# Patient Record
Sex: Female | Born: 1945
Health system: Southern US, Community
[De-identification: ages and names within clinical notes are randomized; demographics above are authoritative.]

## PROBLEM LIST (undated history)

## (undated) DIAGNOSIS — K224 Dyskinesia of esophagus: Secondary | ICD-10-CM

## (undated) DIAGNOSIS — I519 Heart disease, unspecified: Secondary | ICD-10-CM

## (undated) DIAGNOSIS — E039 Hypothyroidism, unspecified: Secondary | ICD-10-CM

## (undated) DIAGNOSIS — F32A Depression, unspecified: Secondary | ICD-10-CM

## (undated) DIAGNOSIS — K449 Diaphragmatic hernia without obstruction or gangrene: Secondary | ICD-10-CM

## (undated) DIAGNOSIS — E7800 Pure hypercholesterolemia, unspecified: Secondary | ICD-10-CM

## (undated) DIAGNOSIS — T7840XA Allergy, unspecified, initial encounter: Secondary | ICD-10-CM

## (undated) DIAGNOSIS — I10 Essential (primary) hypertension: Secondary | ICD-10-CM

## (undated) DIAGNOSIS — E119 Type 2 diabetes mellitus without complications: Secondary | ICD-10-CM

## (undated) DIAGNOSIS — R7303 Prediabetes: Secondary | ICD-10-CM

## (undated) DIAGNOSIS — R413 Other amnesia: Secondary | ICD-10-CM

## (undated) DIAGNOSIS — F329 Major depressive disorder, single episode, unspecified: Secondary | ICD-10-CM

## (undated) HISTORY — DX: Dyskinesia of esophagus: K22.4

## (undated) HISTORY — DX: Depression, unspecified: F32.A

## (undated) HISTORY — DX: Type 2 diabetes mellitus without complications: E11.9

## (undated) HISTORY — PX: TOTAL ABDOMINAL HYSTERECTOMY W/ BILATERAL SALPINGOOPHORECTOMY: SHX83

## (undated) HISTORY — DX: Diaphragmatic hernia without obstruction or gangrene: K44.9

## (undated) HISTORY — DX: Other amnesia: R41.3

## (undated) HISTORY — DX: Hypothyroidism, unspecified: E03.9

## (undated) HISTORY — PX: BACK SURGERY: SHX140

## (undated) HISTORY — PX: ABDOMINAL HYSTERECTOMY: SHX81

## (undated) HISTORY — DX: Allergy, unspecified, initial encounter: T78.40XA

## (undated) HISTORY — DX: Major depressive disorder, single episode, unspecified: F32.9

## (undated) HISTORY — DX: Heart disease, unspecified: I51.9

## (undated) HISTORY — DX: Pure hypercholesterolemia, unspecified: E78.00

## (undated) HISTORY — DX: Essential (primary) hypertension: I10

---

## 2015-04-08 DIAGNOSIS — R1013 Epigastric pain: Secondary | ICD-10-CM

## 2015-04-08 DIAGNOSIS — R1310 Dysphagia, unspecified: Secondary | ICD-10-CM

## 2015-04-08 HISTORY — DX: Epigastric pain: R10.13

## 2015-04-08 HISTORY — DX: Dysphagia, unspecified: R13.10

## 2015-07-02 DIAGNOSIS — K449 Diaphragmatic hernia without obstruction or gangrene: Secondary | ICD-10-CM | POA: Insufficient documentation

## 2015-07-02 DIAGNOSIS — F331 Major depressive disorder, recurrent, moderate: Secondary | ICD-10-CM | POA: Insufficient documentation

## 2015-07-02 DIAGNOSIS — R5383 Other fatigue: Secondary | ICD-10-CM

## 2015-07-02 DIAGNOSIS — R5381 Other malaise: Secondary | ICD-10-CM

## 2015-07-02 DIAGNOSIS — I5022 Chronic systolic (congestive) heart failure: Secondary | ICD-10-CM

## 2015-07-02 DIAGNOSIS — K5904 Chronic idiopathic constipation: Secondary | ICD-10-CM | POA: Insufficient documentation

## 2015-07-02 DIAGNOSIS — I25110 Atherosclerotic heart disease of native coronary artery with unstable angina pectoris: Secondary | ICD-10-CM | POA: Insufficient documentation

## 2015-07-02 HISTORY — DX: Atherosclerotic heart disease of native coronary artery with unstable angina pectoris: I25.110

## 2015-07-02 HISTORY — DX: Other malaise: R53.81

## 2015-07-02 HISTORY — DX: Other fatigue: R53.83

## 2015-07-02 HISTORY — DX: Diaphragmatic hernia without obstruction or gangrene: K44.9

## 2015-07-02 HISTORY — DX: Major depressive disorder, recurrent, moderate: F33.1

## 2015-07-02 HISTORY — DX: Chronic systolic (congestive) heart failure: I50.22

## 2015-07-02 HISTORY — DX: Chronic idiopathic constipation: K59.04

## 2015-08-11 DIAGNOSIS — I209 Angina pectoris, unspecified: Secondary | ICD-10-CM | POA: Diagnosis present

## 2015-08-11 DIAGNOSIS — E119 Type 2 diabetes mellitus without complications: Secondary | ICD-10-CM | POA: Insufficient documentation

## 2015-08-11 DIAGNOSIS — I200 Unstable angina: Secondary | ICD-10-CM | POA: Diagnosis present

## 2015-08-11 HISTORY — DX: Type 2 diabetes mellitus without complications: E11.9

## 2015-08-11 HISTORY — DX: Unstable angina: I20.0

## 2015-08-11 NOTE — H&P (Signed)
Deborah Farley is an 70 y.o. female.   Chief Complaint: Chest pain HPI: Patient with h/o hypertension, hyperlipidemia with both exertional CP and chest pain at rest ongoing for 5-6 years has noted worseing dyspnea and CP last few months. Ordinary activity brings on her symptoms. CP described as pressure without radiation and present in the precordium. Even taking garbage at home brings on symptoms of dyspnea and CP.  She has had a nuclear stress test about a year ago and this was mildly abnormal.  Hence due to frequent episodes of chest pain she was requested by her cardiologist Dr. Tomie China to proceed with coronary angiography. Patient now presents for the same and evaluation of chest pain.  Past medical history: Hypertension,hyperlipidemia, hyperglycemia.  No past surgical history on file.  Family history: No premature CAD and DM in family.   Social History:  No tobacco, alcohol, and  Illicit drug history.  Allergies: NKDA. Severe contrast allergy. Anaphylaxis.  Scheduled Meds: . famotidine (PEPCID) IV  40 mg Intravenous Once  . sodium chloride flush  3 mL Intravenous Q12H   Continuous Infusions: . heparin     PRN Meds:.sodium chloride, heparin, HYDROmorphone, midazolam, sodium chloride flush  BMP Latest Ref Rng 08/12/2015  Glucose 65 - 99 mg/dL 299(M)  BUN 6 - 20 mg/dL 12  Creatinine 4.26 - 8.34 mg/dL 1.96(Q)  Sodium 229 - 798 mmol/L 141  Potassium 3.5 - 5.1 mmol/L 3.7  Chloride 101 - 111 mmol/L 106  CO2 22 - 32 mmol/L 24  Calcium 8.9 - 10.3 mg/dL 9.2    CBC Latest Ref Rng 08/12/2015  WBC 4.0 - 10.5 K/uL 10.3  Hemoglobin 12.0 - 15.0 g/dL 92.1  Hematocrit 19.4 - 46.0 % 43.7  Platelets 150 - 400 K/uL 261      Review of Systems  Constitutional: Negative for fever and chills.  HENT: Negative for hearing loss.   Eyes: Negative for double vision.  Respiratory: Positive for shortness of breath.   Cardiovascular: Positive for chest pain. Negative for palpitations, orthopnea and  leg swelling.  Gastrointestinal: Negative for heartburn, nausea and abdominal pain.  Genitourinary: Negative for dysuria.  Musculoskeletal: Negative for myalgias.  Neurological: Negative for dizziness and tremors.  Psychiatric/Behavioral: Negative for depression and suicidal ideas.   Blood pressure 113/68, pulse 64, temperature 97.6 F (36.4 C), temperature source Oral, resp. rate 18, height 5\' 3"  (1.6 m), weight 97.523 kg (215 lb), SpO2 96 %.  Physical Exam  Constitutional: No distress.  HENT:  Head: Normocephalic.  Neck: Normal range of motion.  Cardiovascular: Normal rate, regular rhythm, normal heart sounds and intact distal pulses.   Respiratory: Effort normal.  GI: Soft. Bowel sounds are normal.  Musculoskeletal: Normal range of motion.  Neurological: She is alert.  Skin: Skin is warm. She is not diaphoretic.     Assessment/Plan 1. CP 2. HTN 3. Hyperlipidemia 4. Prediabetes  Recommendation: Patient has been recommended coronary angiography. This is due to persistent symptoms of chest discomfort and dyspnea on exertion even with very minimal activities and activities of daily living.  Discussed risks, benefits and alternatives of angiogram including but not limited to <1% risk of death, stroke, MI, need for urgent surgical revascularization, renal failure, but not limited to thest. patient is willing to proceed.   , MD 08/11/2015, 9:33 PM

## 2015-08-12 ENCOUNTER — Ambulatory Visit (HOSPITAL_COMMUNITY)
Admission: RE | Admit: 2015-08-12 | Discharge: 2015-08-12 | Disposition: A | Discharge status: Home / Self Care | Payer: Medicare Other | Source: Ambulatory Visit | Attending: Cardiology | Admitting: Cardiology

## 2015-08-12 ENCOUNTER — Encounter (HOSPITAL_COMMUNITY)
Admission: RE | Disposition: A | Discharge status: Home / Self Care | Payer: Self-pay | Source: Ambulatory Visit | Attending: Cardiology

## 2015-08-12 DIAGNOSIS — R0600 Dyspnea, unspecified: Secondary | ICD-10-CM | POA: Insufficient documentation

## 2015-08-12 DIAGNOSIS — I200 Unstable angina: Secondary | ICD-10-CM | POA: Diagnosis present

## 2015-08-12 DIAGNOSIS — Q245 Malformation of coronary vessels: Secondary | ICD-10-CM | POA: Diagnosis not present

## 2015-08-12 DIAGNOSIS — R0789 Other chest pain: Secondary | ICD-10-CM | POA: Insufficient documentation

## 2015-08-12 DIAGNOSIS — E785 Hyperlipidemia, unspecified: Secondary | ICD-10-CM | POA: Diagnosis not present

## 2015-08-12 DIAGNOSIS — I209 Angina pectoris, unspecified: Secondary | ICD-10-CM | POA: Diagnosis present

## 2015-08-12 DIAGNOSIS — M069 Rheumatoid arthritis, unspecified: Secondary | ICD-10-CM | POA: Insufficient documentation

## 2015-08-12 DIAGNOSIS — R7303 Prediabetes: Secondary | ICD-10-CM | POA: Diagnosis not present

## 2015-08-12 DIAGNOSIS — I10 Essential (primary) hypertension: Secondary | ICD-10-CM | POA: Diagnosis not present

## 2015-08-12 HISTORY — PX: CARDIAC CATHETERIZATION: SHX172

## 2015-08-12 LAB — BASIC METABOLIC PANEL
Anion gap: 11 (ref 5–15)
BUN: 12 mg/dL (ref 6–20)
CALCIUM: 9.2 mg/dL (ref 8.9–10.3)
CO2: 24 mmol/L (ref 22–32)
CREATININE: 1.07 mg/dL — AB (ref 0.44–1.00)
Chloride: 106 mmol/L (ref 101–111)
GFR calc Af Amer: 60 mL/min — ABNORMAL LOW (ref >60)
GFR, EST NON AFRICAN AMERICAN: 51 mL/min — AB (ref >60)
Glucose, Bld: 110 mg/dL — ABNORMAL HIGH (ref 65–99)
POTASSIUM: 3.7 mmol/L (ref 3.5–5.1)
SODIUM: 141 mmol/L (ref 135–145)

## 2015-08-12 LAB — CBC
HCT: 43.7 % (ref 36.0–46.0)
Hemoglobin: 14.9 g/dL (ref 12.0–15.0)
MCH: 32.9 pg (ref 26.0–34.0)
MCHC: 34.1 g/dL (ref 30.0–36.0)
MCV: 96.5 fL (ref 78.0–100.0)
PLATELETS: 261 10*3/uL (ref 150–400)
RBC: 4.53 MIL/uL (ref 3.87–5.11)
RDW: 13.9 % (ref 11.5–15.5)
WBC: 10.3 10*3/uL (ref 4.0–10.5)

## 2015-08-12 LAB — PROTIME-INR
INR: 1.07 (ref 0.00–1.49)
PROTHROMBIN TIME: 14.1 s (ref 11.6–15.2)

## 2015-08-12 SURGERY — LEFT HEART CATH AND CORONARY ANGIOGRAPHY

## 2015-08-12 MED ORDER — ASPIRIN 81 MG PO CHEW
81.0000 mg | CHEWABLE_TABLET | ORAL | Status: AC
  Administered 2015-08-12: 81 mg via ORAL

## 2015-08-12 MED ORDER — NITROGLYCERIN 1 MG/10 ML FOR IR/CATH LAB
INTRA_ARTERIAL | Status: DC | PRN
  Administered 2015-08-12: 200 ug via INTRACORONARY

## 2015-08-12 MED ORDER — HEPARIN SODIUM (PORCINE) 1000 UNIT/ML IJ SOLN
INTRAMUSCULAR | Status: AC
  Filled 2015-08-12: qty 1

## 2015-08-12 MED ORDER — MIDAZOLAM HCL 2 MG/2ML IJ SOLN
INTRAMUSCULAR | Status: AC
  Filled 2015-08-12: qty 2

## 2015-08-12 MED ORDER — METHYLPREDNISOLONE SODIUM SUCC 125 MG IJ SOLR
125.0000 mg | Freq: Once | INTRAMUSCULAR | Status: AC
  Administered 2015-08-12: 125 mg via INTRAVENOUS

## 2015-08-12 MED ORDER — SODIUM CHLORIDE 0.9% FLUSH
3.0000 mL | INTRAVENOUS | Status: DC | PRN
Start: 2015-08-12 — End: 2015-08-12

## 2015-08-12 MED ORDER — SODIUM CHLORIDE 0.9 % IV SOLN: 40.0000 mg | Freq: Once | INTRAVENOUS | Status: DC

## 2015-08-12 MED ORDER — HEPARIN (PORCINE) IN NACL 2-0.9 UNIT/ML-% IJ SOLN
INTRAMUSCULAR | Status: AC
  Filled 2015-08-12: qty 1000

## 2015-08-12 MED ORDER — HEPARIN SODIUM (PORCINE) 1000 UNIT/ML IJ SOLN
INTRAMUSCULAR | Status: DC | PRN
  Administered 2015-08-12: 4000 [IU] via INTRAVENOUS

## 2015-08-12 MED ORDER — IOPAMIDOL (ISOVUE-370) INJECTION 76%
INTRAVENOUS | Status: AC
  Filled 2015-08-12: qty 100

## 2015-08-12 MED ORDER — IOPAMIDOL (ISOVUE-370) INJECTION 76%
INTRAVENOUS | Status: DC | PRN
  Administered 2015-08-12: 50 mL via INTRAVENOUS

## 2015-08-12 MED ORDER — SODIUM CHLORIDE 0.9 % WEIGHT BASED INFUSION: 3.0000 mL/kg/h | INTRAVENOUS | Status: DC

## 2015-08-12 MED ORDER — LIDOCAINE HCL (PF) 1 % IJ SOLN
INTRAMUSCULAR | Status: AC
  Filled 2015-08-12: qty 30

## 2015-08-12 MED ORDER — METHYLPREDNISOLONE SODIUM SUCC 125 MG IJ SOLR
INTRAMUSCULAR | Status: AC
  Administered 2015-08-12: 125 mg via INTRAVENOUS
  Filled 2015-08-12: qty 2

## 2015-08-12 MED ORDER — SODIUM CHLORIDE 0.9 % WEIGHT BASED INFUSION
3.0000 mL/kg/h | INTRAVENOUS | Status: DC
  Administered 2015-08-12: 3 mL/kg/h via INTRAVENOUS

## 2015-08-12 MED ORDER — FAMOTIDINE IN NACL 20-0.9 MG/50ML-% IV SOLN
INTRAVENOUS | Status: AC
  Administered 2015-08-12: 20 mg
  Filled 2015-08-12: qty 50

## 2015-08-12 MED ORDER — VERAPAMIL HCL 2.5 MG/ML IV SOLN
INTRAVENOUS | Status: AC
  Filled 2015-08-12: qty 2

## 2015-08-12 MED ORDER — SODIUM CHLORIDE 0.9 % IV SOLN
250.0000 mL | INTRAVENOUS | Status: DC | PRN
Start: 2015-08-12 — End: 2015-08-12

## 2015-08-12 MED ORDER — DIPHENHYDRAMINE HCL 50 MG/ML IJ SOLN
INTRAMUSCULAR | Status: AC
  Administered 2015-08-12: 25 mg via INTRAVENOUS
  Filled 2015-08-12: qty 1

## 2015-08-12 MED ORDER — HYDROMORPHONE HCL 1 MG/ML IJ SOLN
INTRAMUSCULAR | Status: DC | PRN
  Administered 2015-08-12: 0.5 mg via INTRAVENOUS

## 2015-08-12 MED ORDER — SODIUM CHLORIDE 0.9% FLUSH: 3.0000 mL | Freq: Two times a day (BID) | INTRAVENOUS | Status: DC

## 2015-08-12 MED ORDER — HYDROMORPHONE HCL 1 MG/ML IJ SOLN
INTRAMUSCULAR | Status: AC
  Filled 2015-08-12: qty 1

## 2015-08-12 MED ORDER — MIDAZOLAM HCL 2 MG/2ML IJ SOLN
INTRAMUSCULAR | Status: DC | PRN
  Administered 2015-08-12: 2 mg via INTRAVENOUS

## 2015-08-12 MED ORDER — DIPHENHYDRAMINE HCL 50 MG/ML IJ SOLN
25.0000 mg | Freq: Once | INTRAMUSCULAR | Status: AC
  Administered 2015-08-12: 25 mg via INTRAVENOUS

## 2015-08-12 MED ORDER — LIDOCAINE HCL (PF) 1 % IJ SOLN
INTRAMUSCULAR | Status: DC | PRN
  Administered 2015-08-12: 3 mL

## 2015-08-12 MED ORDER — DILTIAZEM HCL ER COATED BEADS 180 MG PO CP24: 180.0000 mg | ORAL_CAPSULE | Freq: Every day | ORAL | Status: DC

## 2015-08-12 MED ORDER — HEPARIN (PORCINE) IN NACL 2-0.9 UNIT/ML-% IJ SOLN
INTRAMUSCULAR | Status: DC | PRN
  Administered 2015-08-12: 1000 mL

## 2015-08-12 MED ORDER — SODIUM CHLORIDE 0.9 % WEIGHT BASED INFUSION: 1.0000 mL/kg/h | INTRAVENOUS | Status: DC

## 2015-08-12 MED ORDER — SODIUM CHLORIDE 0.9% FLUSH: 3.0000 mL | INTRAVENOUS | Status: DC | PRN

## 2015-08-12 MED ORDER — LIDOCAINE HCL (PF) 1 % IJ SOLN
INTRAMUSCULAR | Status: DC | PRN
  Administered 2015-08-12: 16:00:00 via INTRA_ARTERIAL

## 2015-08-12 MED ORDER — NITROGLYCERIN 1 MG/10 ML FOR IR/CATH LAB
INTRA_ARTERIAL | Status: AC
  Filled 2015-08-12: qty 10

## 2015-08-12 MED ORDER — ASPIRIN 81 MG PO CHEW
CHEWABLE_TABLET | ORAL | Status: AC
  Administered 2015-08-12: 81 mg via ORAL
  Filled 2015-08-12: qty 1

## 2015-08-12 SURGICAL SUPPLY — 8 items
CATH OPTITORQUE TIG 4.0 5F (CATHETERS) ×2 IMPLANT
DEVICE RAD COMP TR BAND LRG (VASCULAR PRODUCTS) ×2 IMPLANT
GLIDESHEATH SLEND A-KIT 6F 20G (SHEATH) ×2 IMPLANT
KIT HEART LEFT (KITS) ×2 IMPLANT
PACK CARDIAC CATHETERIZATION (CUSTOM PROCEDURE TRAY) ×2 IMPLANT
TRANSDUCER W/STOPCOCK (MISCELLANEOUS) ×2 IMPLANT
TUBING CIL FLEX 10 FLL-RA (TUBING) ×2 IMPLANT
WIRE SAFE-T 1.5MM-J .035X260CM (WIRE) ×2 IMPLANT

## 2015-08-12 NOTE — Discharge Instructions (Signed)
Radial Site Care °Refer to this sheet in the next few weeks. These instructions provide you with information about caring for yourself after your procedure. Your health care provider may also give you more specific instructions. Your treatment has been planned according to current medical practices, but problems sometimes occur. Call your health care provider if you have any problems or questions after your procedure. °WHAT TO EXPECT AFTER THE PROCEDURE °After your procedure, it is typical to have the following: °· Bruising at the radial site that usually fades within 1-2 weeks. °· Blood collecting in the tissue (hematoma) that may be painful to the touch. It should usually decrease in size and tenderness within 1-2 weeks. °HOME CARE INSTRUCTIONS °· Take medicines only as directed by your health care provider. °· You may shower 24-48 hours after the procedure or as directed by your health care provider. Remove the bandage (dressing) and gently wash the site with plain soap and water. Pat the area dry with a clean towel. Do not rub the site, because this may cause bleeding. °· Do not take baths, swim, or use a hot tub until your health care provider approves. °· Check your insertion site every day for redness, swelling, or drainage. °· Do not apply powder or lotion to the site. °· Do not flex or bend the affected arm for 24 hours or as directed by your health care provider. °· Do not push or pull heavy objects with the affected arm for 24 hours or as directed by your health care provider. °· Do not lift over 10 lb (4.5 kg) for 5 days after your procedure or as directed by your health care provider. °· Ask your health care provider when it is okay to: °¨ Return to work or school. °¨ Resume usual physical activities or sports. °¨ Resume sexual activity. °· Do not drive home if you are discharged the same day as the procedure. Have someone else drive you. °· You may drive 24 hours after the procedure unless otherwise  instructed by your health care provider. °· Do not operate machinery or power tools for 24 hours after the procedure. °· If your procedure was done as an outpatient procedure, which means that you went home the same day as your procedure, a responsible adult should be with you for the first 24 hours after you arrive home. °· Keep all follow-up visits as directed by your health care provider. This is important. °SEEK MEDICAL CARE IF: °· You have a fever. °· You have chills. °· You have increased bleeding from the radial site. Hold pressure on the site. CALL 911 °SEEK IMMEDIATE MEDICAL CARE IF: °· You have unusual pain at the radial site. °· You have redness, warmth, or swelling at the radial site. °· You have drainage (other than a small amount of blood on the dressing) from the radial site. °· The radial site is bleeding, and the bleeding does not stop after 30 minutes of holding steady pressure on the site. °· Your arm or hand becomes pale, cool, tingly, or numb. °  °This information is not intended to replace advice given to you by your health care provider. Make sure you discuss any questions you have with your health care provider. °  °Document Released: 05/27/2010 Document Revised: 05/15/2014 Document Reviewed: 11/10/2013 °Elsevier Interactive Patient Education ©2016 [employer] ° °

## 2015-08-12 NOTE — Interval H&P Note (Signed)
History and Physical Interval Note:  08/12/2015 4:13 PM  Deborah Farley  has presented today for surgery, with the diagnosis of cp  The various methods of treatment have been discussed with the patient and family. After consideration of risks, benefits and other options for treatment, the patient has consented to  Procedure(s): Left Heart Cath and Coronary Angiography (N/A) and possible PCI as a surgical intervention .  The patient's history has been reviewed, patient examined, no change in status, stable for surgery.  I have reviewed the patient's chart and labs.  Questions were answered to the patient's satisfaction.   Ischemic Symptoms? CCS III (Marked limitation of ordinary activity) Anti-ischemic Medical Therapy? Maximal Medical Therapy (2 or more classes of medications) Non-invasive Test Results? No non-invasive testing performed Prior CABG? No Previous CABG   Patient Information:   1-2V CAD, no prox LAD  A (7)  Indication: 20; Score: 7   Patient Information:   1-2V-CAD with DS 50-60% With No FFR, No IVUS  I (3)  Indication: 21; Score: 3   Patient Information:   1-2V-CAD with DS 50-60% With FFR  A (7)  Indication: 22; Score: 7   Patient Information:   1-2V-CAD with DS 50-60% With FFR>0.8, IVUS not significant  I (2)  Indication: 23; Score: 2   Patient Information:   3V-CAD without LMCA With Abnormal LV systolic function  A (9)  Indication: 48; Score: 9   Patient Information:   LMCA-CAD  A (9)  Indication: 49; Score: 9   Patient Information:   2V-CAD with prox LAD PCI  A (7)  Indication: 62; Score: 7   Patient Information:   2V-CAD with prox LAD CABG  A (8)  Indication: 62; Score: 8   Patient Information:   3V-CAD without LMCA With Low CAD burden(i.e., 3 focal stenoses, low SYNTAX score) PCI  A (7)  Indication: 63; Score: 7   Patient Information:   3V-CAD without LMCA With Low CAD burden(i.e., 3 focal stenoses, low SYNTAX  score) CABG  A (9)  Indication: 63; Score: 9   Patient Information:   3V-CAD without LMCA E06c - Intermediate-high CAD burden (i.e., multiple diffuse lesions, presence of CTO, or high SYNTAX score) PCI  U (4)  Indication: 64; Score: 4   Patient Information:   3V-CAD without LMCA E06c - Intermediate-high CAD burden (i.e., multiple diffuse lesions, presence of CTO, or high SYNTAX score) CABG  A (9)  Indication: 64; Score: 9   Patient Information:   LMCA-CAD With Isolated LMCA stenosis  PCI  U (6)  Indication: 65; Score: 6   Patient Information:   LMCA-CAD With Isolated LMCA stenosis  CABG  A (9)  Indication: 65; Score: 9   Patient Information:   LMCA-CAD Additional CAD, low CAD burden (i.e., 1- to 2-vessel additional involvement, low SYNTAX score) PCI  U (5)  Indication: 66; Score: 5   Patient Information:   LMCA-CAD Additional CAD, low CAD burden (i.e., 1- to 2-vessel additional involvement, low SYNTAX score) CABG  A (9)  Indication: 66; Score: 9   Patient Information:   LMCA-CAD Additional CAD, intermediate-high CAD burden (i.e., 3-vessel involvement, presence of CTO, or high SYNTAX score) PCI  I (3)  Indication: 67; Score: 3   Patient Information:   LMCA-CAD Additional CAD, intermediate-high CAD burden (i.e., 3-vessel involvement, presence of CTO, or high SYNTAX score) CABG  A (9)  Indication: 67; Score: 9   Deborah Farley

## 2015-08-13 ENCOUNTER — Encounter (HOSPITAL_COMMUNITY): Payer: Self-pay | Admitting: Cardiology

## 2015-09-08 DIAGNOSIS — Q245 Malformation of coronary vessels: Secondary | ICD-10-CM | POA: Diagnosis not present

## 2015-09-08 DIAGNOSIS — E119 Type 2 diabetes mellitus without complications: Secondary | ICD-10-CM | POA: Diagnosis not present

## 2015-09-08 DIAGNOSIS — E785 Hyperlipidemia, unspecified: Secondary | ICD-10-CM | POA: Diagnosis not present

## 2015-09-08 DIAGNOSIS — I10 Essential (primary) hypertension: Secondary | ICD-10-CM | POA: Diagnosis not present

## 2015-09-10 DIAGNOSIS — Q245 Malformation of coronary vessels: Secondary | ICD-10-CM | POA: Diagnosis not present

## 2015-09-10 DIAGNOSIS — E785 Hyperlipidemia, unspecified: Secondary | ICD-10-CM | POA: Diagnosis not present

## 2015-09-10 DIAGNOSIS — Z7901 Long term (current) use of anticoagulants: Secondary | ICD-10-CM | POA: Diagnosis not present

## 2015-09-10 DIAGNOSIS — I10 Essential (primary) hypertension: Secondary | ICD-10-CM | POA: Diagnosis not present

## 2015-09-10 DIAGNOSIS — E119 Type 2 diabetes mellitus without complications: Secondary | ICD-10-CM | POA: Diagnosis not present

## 2015-09-10 DIAGNOSIS — I4892 Unspecified atrial flutter: Secondary | ICD-10-CM | POA: Diagnosis not present

## 2015-10-05 DIAGNOSIS — I10 Essential (primary) hypertension: Secondary | ICD-10-CM | POA: Diagnosis not present

## 2015-10-05 DIAGNOSIS — R7303 Prediabetes: Secondary | ICD-10-CM | POA: Diagnosis not present

## 2015-10-05 DIAGNOSIS — E559 Vitamin D deficiency, unspecified: Secondary | ICD-10-CM | POA: Diagnosis not present

## 2015-10-05 DIAGNOSIS — E039 Hypothyroidism, unspecified: Secondary | ICD-10-CM | POA: Diagnosis not present

## 2015-10-05 DIAGNOSIS — Z79899 Other long term (current) drug therapy: Secondary | ICD-10-CM | POA: Diagnosis not present

## 2015-10-05 DIAGNOSIS — E785 Hyperlipidemia, unspecified: Secondary | ICD-10-CM | POA: Diagnosis not present

## 2016-01-31 DIAGNOSIS — E039 Hypothyroidism, unspecified: Secondary | ICD-10-CM | POA: Diagnosis not present

## 2016-01-31 DIAGNOSIS — I10 Essential (primary) hypertension: Secondary | ICD-10-CM | POA: Diagnosis not present

## 2016-01-31 DIAGNOSIS — Z0000 Encounter for general adult medical examination without abnormal findings: Secondary | ICD-10-CM | POA: Diagnosis not present

## 2016-01-31 DIAGNOSIS — E785 Hyperlipidemia, unspecified: Secondary | ICD-10-CM | POA: Diagnosis not present

## 2016-01-31 DIAGNOSIS — Z9181 History of falling: Secondary | ICD-10-CM | POA: Diagnosis not present

## 2016-01-31 DIAGNOSIS — Z23 Encounter for immunization: Secondary | ICD-10-CM | POA: Diagnosis not present

## 2016-02-25 DIAGNOSIS — M8588 Other specified disorders of bone density and structure, other site: Secondary | ICD-10-CM | POA: Diagnosis not present

## 2016-02-25 DIAGNOSIS — Z1231 Encounter for screening mammogram for malignant neoplasm of breast: Secondary | ICD-10-CM | POA: Diagnosis not present

## 2016-02-25 DIAGNOSIS — M8589 Other specified disorders of bone density and structure, multiple sites: Secondary | ICD-10-CM | POA: Diagnosis not present

## 2016-02-28 DIAGNOSIS — I10 Essential (primary) hypertension: Secondary | ICD-10-CM | POA: Diagnosis not present

## 2016-02-28 DIAGNOSIS — E785 Hyperlipidemia, unspecified: Secondary | ICD-10-CM | POA: Diagnosis not present

## 2016-02-28 DIAGNOSIS — Q245 Malformation of coronary vessels: Secondary | ICD-10-CM | POA: Diagnosis not present

## 2016-02-28 DIAGNOSIS — E119 Type 2 diabetes mellitus without complications: Secondary | ICD-10-CM | POA: Diagnosis not present

## 2016-02-29 DIAGNOSIS — I10 Essential (primary) hypertension: Secondary | ICD-10-CM | POA: Insufficient documentation

## 2016-02-29 DIAGNOSIS — E785 Hyperlipidemia, unspecified: Secondary | ICD-10-CM | POA: Insufficient documentation

## 2016-02-29 DIAGNOSIS — E039 Hypothyroidism, unspecified: Secondary | ICD-10-CM | POA: Insufficient documentation

## 2016-02-29 DIAGNOSIS — E669 Obesity, unspecified: Secondary | ICD-10-CM | POA: Insufficient documentation

## 2016-02-29 DIAGNOSIS — K219 Gastro-esophageal reflux disease without esophagitis: Secondary | ICD-10-CM

## 2016-02-29 DIAGNOSIS — F32A Depression, unspecified: Secondary | ICD-10-CM | POA: Insufficient documentation

## 2016-02-29 DIAGNOSIS — F329 Major depressive disorder, single episode, unspecified: Secondary | ICD-10-CM | POA: Insufficient documentation

## 2016-02-29 HISTORY — DX: Gastro-esophageal reflux disease without esophagitis: K21.9

## 2016-02-29 HISTORY — DX: Hyperlipidemia, unspecified: E78.5

## 2016-02-29 HISTORY — DX: Obesity, unspecified: E66.9

## 2016-02-29 NOTE — Progress Notes (Signed)
*IMAGE* Office Visit Note  Patient: Deborah Farley             Date of Birth: [birth year]           MRN: [id number]             PCP: Lucianne Lei MD Referring:UPPIN,Nina MD Visit Date: 03/01/2016  Occupation: Retired surgical tech    Subjective:  Rheumatoid Arthritis   History of Present Illness: Deborah Farley is a 70 y.o. female . She is right-handed retired TEFL teacher. According to her in 21s she was diagnosed with rheumatoid arthritis at the time she developed increased pain and swelling in multiple joints. Her initial rheumatologist tried her on some medications which she does not recall the name of, but they were not effective. In the mid 90s she was placed on methotrexate by her rheumatologist in [place]. She recalls the methotrexate worked very well for her and she continued it since then. She moved to [place] 3 years ago and was under care of Dr. Shary Decamp, no filled her methotrexate so far. Now she is under care of Dr. Gaspar Skeeters, she wanted her to have a rheumatological evaluation to continue methotrexate. Patient reports again in bilateral wrist joints bilateral ankles right Achilles tendon , right first toe and bilateral trochanteric bursa. She denies swelling in any of her joints currently.  Activities of Daily Living:  Patient reports morning stiffness for 20 minutes.   Patient Denies nocturnal pain.  Difficulty dressing/grooming: Reports Difficulty climbing stairs: Reports Difficulty getting out of chair: Reports Difficulty using hands for taps, buttons, cutlery, and/or writing: Reports   Review of Systems  Constitutional: Positive for fatigue and weakness.  HENT: Negative.   Eyes: Negative.   Respiratory: Negative.   Cardiovascular: Positive for hypertension.  Gastrointestinal: Positive for constipation.  Endocrine: Negative.   Genitourinary: Negative.   Musculoskeletal: Positive for arthralgias, joint pain and morning stiffness.  Skin: Negative.  Negative for rash.   Allergic/Immunologic: Negative.   Neurological: Positive for dizziness.  Hematological: Negative.   Psychiatric/Behavioral: Positive for depressed mood.    PMFS History:  Patient Active Problem List   Diagnosis Date Noted  . Diabetes (HCC) 03/01/2016  . Hyperlipidemia 02/29/2016  . Hypertension 02/29/2016  . Hypothyroidism 02/29/2016  . Depression 02/29/2016  . Obesity (BMI 35.0-39.9 without comorbidity) 02/29/2016  . Acid reflux 02/29/2016  . Angina pectoris (HCC) 08/11/2015    Past Medical History:  Diagnosis Date  . Depression   . Diabetes mellitus without complication (HCC)   . Heart disease   . Hernia, hiatal   . High cholesterol   . Hypertension   . Hypothyroidism   . Memory difficulty   . Spastic esophagus     No family history on file. Past Surgical History:  Procedure Laterality Date  . ABDOMINAL HYSTERECTOMY    . BACK SURGERY    . CARDIAC CATHETERIZATION N/A 08/12/2015   Procedure: Left Heart Cath and Coronary Angiography;  Surgeon: Yates Decamp, MD;  Location: [facility] INVASIVE CV LAB;  Service: Cardiovascular;  Laterality: N/A;  . TOTAL ABDOMINAL HYSTERECTOMY W/ BILATERAL SALPINGOOPHORECTOMY     Social History   Social History Narrative  . No narrative on file     Objective: Vital Signs: BP 127/72 (BP Location: Right Arm, Patient Position: Sitting, Cuff Size: Large)   Pulse 63   Resp 13   Ht 5\' 3"  (1.6 m)   Wt 218 lb (98.9 kg)   BMI 38.62 kg/m    Physical Exam  Constitutional: She is oriented  to person, place, and time. She appears well-developed and well-nourished.  HENT:  Head: Normocephalic and atraumatic.  Eyes: Conjunctivae are normal.  Neck: Normal range of motion. Neck supple.  Cardiovascular: Normal rate, regular rhythm, normal heart sounds and intact distal pulses.   Pulmonary/Chest: Effort normal and breath sounds normal.  Abdominal: Soft. Bowel sounds are normal.  Lymphadenopathy:    She has no cervical adenopathy.  Neurological: She is  alert and oriented to person, place, and time.  Skin: Skin is warm and dry. Capillary refill takes more than 3 seconds.  Psychiatric: She has a normal mood and affect. Her behavior is normal.     Musculoskeletal Exam: She has good range of motion of her C-spine and thoracic lumbar spine. Shoulder joints which was restrained MCPs PIPs with good range of motion with no synovitis. Hip joints knee joints, ankle joints, MTPs and PIPs of her feet were also good range of motion with no synovitis.  CDAI Exam: CDAI Homunculus Exam:   Joint Counts:  CDAI Tender Joint count: 0 CDAI Swollen Joint count: 0  Global Assessments:  Patient Global Assessment: 3 Provider Global Assessment: 3  CDAI Calculated Score: 6    Investigation: No additional findings.   Imaging: No results found.  Speciality Comments: No specialty comments available.    Procedures:  No procedures performed Allergies: Codeine and Iodine   Assessment / Plan: Visit Diagnoses: Rheumatoid arthritis Patient reports long-standing history of rheumatoid arthritis for multiple years . She has been treated with methotrexate which has worked really well for her. She has no synovitis on examination. I will obtain baseline x-ray of her bilateral hands and feet today.  High risk medication use She is on methotrexate 4 tablets per week and is taking folic acid over-the-counter. She reports that she's been getting labs on regular basis.  I will obtain CBC, comprehensive metabolic panel, sedimentation rate, rheumatoid factor, CCP, ANA, hepatitis panel, HIV, TB gold today. Patient reports that she has had a chest x-ray in the last 3 years. She will update the status of pneumococcal vaccine and zoster vaccine with her PCP. I will refill her methotrexate once I get her lab results.  Fibromyalgia She has history of fibromyalgia. She has few tender points on examination and some generalized myalgias.  Foot pain She complains of some  right foot pain but she has no synovitis on examination.  Trochanteric bursitis bilateral She has some discomfort and had recurrent injections in the past.  Other medical problems are as follows: Hyperlipidemia, unspecified hyperlipidemia type  Hypertension, unspecified type  Hypothyroidism, unspecified type  Depression, unspecified depression type  Obesity (BMI 35.0-39.9 without comorbidity)  Type 2 diabetes mellitus without complication, without long-term current use of insulin (HCC)   No problem-specific Assessment & Plan notes found for this encounter.   Follow-Up Instructions: No Follow-up on file.  Orders: No orders of the defined types were placed in this encounter.

## 2016-03-01 ENCOUNTER — Ambulatory Visit (INDEPENDENT_AMBULATORY_CARE_PROVIDER_SITE_OTHER): Payer: Medicare Other

## 2016-03-01 ENCOUNTER — Encounter: Payer: Self-pay | Admitting: Rheumatology

## 2016-03-01 ENCOUNTER — Ambulatory Visit (INDEPENDENT_AMBULATORY_CARE_PROVIDER_SITE_OTHER): Payer: Medicare Other | Admitting: Rheumatology

## 2016-03-01 VITALS — BP 127/72 | HR 63 | Resp 13 | Ht 63.0 in | Wt 218.0 lb

## 2016-03-01 DIAGNOSIS — M069 Rheumatoid arthritis, unspecified: Secondary | ICD-10-CM

## 2016-03-01 DIAGNOSIS — E785 Hyperlipidemia, unspecified: Secondary | ICD-10-CM | POA: Diagnosis not present

## 2016-03-01 DIAGNOSIS — F329 Major depressive disorder, single episode, unspecified: Secondary | ICD-10-CM

## 2016-03-01 DIAGNOSIS — M25572 Pain in left ankle and joints of left foot: Secondary | ICD-10-CM

## 2016-03-01 DIAGNOSIS — Z79899 Other long term (current) drug therapy: Secondary | ICD-10-CM

## 2016-03-01 DIAGNOSIS — E119 Type 2 diabetes mellitus without complications: Secondary | ICD-10-CM

## 2016-03-01 DIAGNOSIS — E669 Obesity, unspecified: Secondary | ICD-10-CM | POA: Diagnosis not present

## 2016-03-01 DIAGNOSIS — I10 Essential (primary) hypertension: Secondary | ICD-10-CM | POA: Diagnosis not present

## 2016-03-01 DIAGNOSIS — M797 Fibromyalgia: Secondary | ICD-10-CM | POA: Diagnosis not present

## 2016-03-01 DIAGNOSIS — M79671 Pain in right foot: Secondary | ICD-10-CM | POA: Diagnosis not present

## 2016-03-01 DIAGNOSIS — F32A Depression, unspecified: Secondary | ICD-10-CM

## 2016-03-01 DIAGNOSIS — E039 Hypothyroidism, unspecified: Secondary | ICD-10-CM

## 2016-03-01 DIAGNOSIS — I519 Heart disease, unspecified: Secondary | ICD-10-CM | POA: Insufficient documentation

## 2016-03-01 DIAGNOSIS — R945 Abnormal results of liver function studies: Secondary | ICD-10-CM | POA: Diagnosis not present

## 2016-03-01 HISTORY — DX: Type 2 diabetes mellitus without complications: E11.9

## 2016-03-01 NOTE — Patient Instructions (Signed)
Standing Labs We placed an order today for your standing lab work.    Please come back and get your standing labs in 28months  We have open lab Monday through Friday from 8:30-11:30 AM and 1-4 PM at the office of Dr. Arbutus Ped, PA.   The office is located at [address], [address], [place], [place] [address] No appointment is necessary.   Labs are drawn by [facility].  You may receive a bill from [facility] for your lab work.   Please update Pneumococcal and zoster vaccine if due.

## 2016-03-02 LAB — URINALYSIS
Bilirubin Urine: NEGATIVE
GLUCOSE, UA: NEGATIVE
HGB URINE DIPSTICK: NEGATIVE
NITRITE: NEGATIVE
PH: 5.5 (ref 5.0–8.0)
SPECIFIC GRAVITY, URINE: 1.031 (ref 1.001–1.035)

## 2016-03-02 LAB — URIC ACID: Uric Acid, Serum: 5.4 mg/dL (ref 2.5–7.0)

## 2016-03-02 LAB — COMPLETE METABOLIC PANEL WITH GFR
ALBUMIN: 4.1 g/dL (ref 3.6–5.1)
ALK PHOS: 112 U/L (ref 33–130)
ALT: 22 U/L (ref 6–29)
AST: 28 U/L (ref 10–35)
BILIRUBIN TOTAL: 0.5 mg/dL (ref 0.2–1.2)
BUN: 14 mg/dL (ref 7–25)
CALCIUM: 9.5 mg/dL (ref 8.6–10.4)
CO2: 24 mmol/L (ref 20–31)
Chloride: 104 mmol/L (ref 98–110)
Creat: 1.01 mg/dL — ABNORMAL HIGH (ref 0.60–0.93)
GFR, Est African American: 65 mL/min (ref >=60)
GFR, Est Non African American: 56 mL/min — ABNORMAL LOW (ref >=60)
Glucose, Bld: 101 mg/dL — ABNORMAL HIGH (ref 65–99)
POTASSIUM: 4.5 mmol/L (ref 3.5–5.3)
Sodium: 140 mmol/L (ref 135–146)
TOTAL PROTEIN: 7.3 g/dL (ref 6.1–8.1)

## 2016-03-02 LAB — CBC WITH DIFFERENTIAL/PLATELET
BASOS ABS: 77 {cells}/uL (ref 0–200)
Basophils Relative: 1 %
Eosinophils Absolute: 154 cells/uL (ref 15–500)
Eosinophils Relative: 2 %
HEMATOCRIT: 46.7 % — AB (ref 35.0–45.0)
HEMOGLOBIN: 15.8 g/dL — AB (ref 11.7–15.5)
LYMPHS ABS: 2541 {cells}/uL (ref 850–3900)
Lymphocytes Relative: 33 %
MCH: 32.2 pg (ref 27.0–33.0)
MCHC: 33.8 g/dL (ref 32.0–36.0)
MCV: 95.1 fL (ref 80.0–100.0)
MONO ABS: 616 {cells}/uL (ref 200–950)
MPV: 10.4 fL (ref 7.5–12.5)
Monocytes Relative: 8 %
NEUTROS PCT: 56 %
Neutro Abs: 4312 cells/uL (ref 1500–7800)
Platelets: 328 10*3/uL (ref 140–400)
RBC: 4.91 MIL/uL (ref 3.80–5.10)
RDW: 14.5 % (ref 11.0–15.0)
WBC: 7.7 10*3/uL (ref 3.8–10.8)

## 2016-03-02 LAB — HEPATITIS PANEL, ACUTE
HCV AB: NEGATIVE
HEP A IGM: NONREACTIVE
HEP B C IGM: NONREACTIVE
Hepatitis B Surface Ag: NEGATIVE

## 2016-03-02 LAB — HIV ANTIBODY (ROUTINE TESTING W REFLEX): HIV: NONREACTIVE

## 2016-03-02 LAB — CK: Total CK: 73 U/L (ref 7–177)

## 2016-03-02 LAB — ANA: ANA: NEGATIVE

## 2016-03-02 LAB — SEDIMENTATION RATE: Sed Rate: 11 mm/hr (ref 0–30)

## 2016-03-02 LAB — TSH: TSH: 2.69 m[IU]/L

## 2016-03-03 LAB — PROTEIN ELECTROPHORESIS, SERUM
ALBUMIN ELP: 3.9 g/dL (ref 3.8–4.8)
ALPHA-1-GLOBULIN: 0.3 g/dL (ref 0.2–0.3)
ALPHA-2-GLOBULIN: 0.8 g/dL (ref 0.5–0.9)
BETA 2: 0.5 g/dL (ref 0.2–0.5)
BETA GLOBULIN: 0.5 g/dL (ref 0.4–0.6)
Gamma Globulin: 1.3 g/dL (ref 0.8–1.7)
Total Protein, Serum Electrophoresis: 7.3 g/dL (ref 6.1–8.1)

## 2016-03-03 LAB — QUANTIFERON TB GOLD ASSAY (BLOOD)
INTERFERON GAMMA RELEASE ASSAY: NEGATIVE
Mitogen-Nil: 9.12 IU/mL
Quantiferon Nil Value: 0.02 IU/mL
Quantiferon Tb Ag Minus Nil Value: 0 IU/mL

## 2016-03-03 LAB — IGG, IGA, IGM
IGG (IMMUNOGLOBIN G), SERUM: 1378 mg/dL (ref 694–1618)
IgA: 349 mg/dL (ref 81–463)
IgM, Serum: 144 mg/dL (ref 48–271)

## 2016-03-07 ENCOUNTER — Telehealth: Payer: Self-pay | Admitting: Radiology

## 2016-03-07 DIAGNOSIS — E119 Type 2 diabetes mellitus without complications: Secondary | ICD-10-CM | POA: Diagnosis not present

## 2016-03-07 NOTE — Telephone Encounter (Signed)
New patient labs /she has appointment to review, I have called patient to advise her UA is abnormal and she needs to discuss with PCP faxed to her PCP Dr Mathis Bud

## 2016-04-12 DIAGNOSIS — Z79899 Other long term (current) drug therapy: Secondary | ICD-10-CM | POA: Insufficient documentation

## 2016-04-12 DIAGNOSIS — M069 Rheumatoid arthritis, unspecified: Secondary | ICD-10-CM

## 2016-04-12 HISTORY — DX: Other long term (current) drug therapy: Z79.899

## 2016-04-12 HISTORY — DX: Rheumatoid arthritis, unspecified: M06.9

## 2016-04-12 NOTE — Progress Notes (Deleted)
Office Visit Note  Patient: Deborah Farley             Date of Birth: 03-Jun-1945           MRN: [id number]             PCP: Nicholos Johns, MD Referring: Jenean Lindau., MD Visit Date: 04/14/2016 Occupation: _0 @    Subjective:  No chief complaint on file.   History of Present Illness: Deborah Farley is a 70 y.o. female ***   Activities of Daily Living:  Patient reports morning stiffness for *** {minute/hour:19697}.   Patient {ACTIONS;DENIES/REPORTS:21021675::"Denies"} nocturnal pain.  Difficulty dressing/grooming: {ACTIONS;DENIES/REPORTS:21021675::"Denies"} Difficulty climbing stairs: {ACTIONS;DENIES/REPORTS:21021675::"Denies"} Difficulty getting out of chair: {ACTIONS;DENIES/REPORTS:21021675::"Denies"} Difficulty using hands for taps, buttons, cutlery, and/or writing: {ACTIONS;DENIES/REPORTS:21021675::"Denies"}   No Rheumatology ROS completed.   PMFS History:  Patient Active Problem List   Diagnosis Date Noted  . Rheumatoid arthritis ([place]) 04/12/2016  . High risk medication use 04/12/2016  . Diabetes ([place]) 03/01/2016  . Hyperlipidemia 02/29/2016  . Hypertension 02/29/2016  . Hypothyroidism 02/29/2016  . Depression 02/29/2016  . Obesity (BMI 35.0-39.9 without comorbidity) 02/29/2016  . Acid reflux 02/29/2016  . Angina pectoris ([place]) 08/11/2015    Past Medical History:  Diagnosis Date  . Depression   . Diabetes mellitus without complication ([place])   . Heart disease   . Hernia, hiatal   . High cholesterol   . Hypertension   . Hypothyroidism   . Memory difficulty   . Spastic esophagus     No family history on file. Past Surgical History:  Procedure Laterality Date  . ABDOMINAL HYSTERECTOMY    . BACK SURGERY    . CARDIAC CATHETERIZATION N/A 08/12/2015   Procedure: Left Heart Cath and Coronary Angiography;  Surgeon: Adrian Prows, MD;  Location: [facility];  Service: Cardiovascular;  Laterality: N/A;  . TOTAL ABDOMINAL HYSTERECTOMY W/ BILATERAL  SALPINGOOPHORECTOMY     Social History   Social History Narrative  . No narrative on file     Objective: Vital Signs: There were no vitals taken for this visit.   Physical Exam   Musculoskeletal Exam: ***  CDAI Exam: No CDAI exam completed.    Investigation: Findings:  Labs from October 2017 CBC hemoglobin 15.8, CMP creatinine 1.01, GFR 56, UA trace protein and leukocytes 1+ ESR 11, TSH normal, CK 73, SPEP normal, immunoglobulins normal, hepatitis panel negative, HIV negative, TB gold negative, ANA negative, rheumatoid factor and CCP are pending  Office Visit on 03/01/2016  Component Date Value Ref Range Status  . IgG (Immunoglobin G), Serum 03/03/2016 1378  694 - 1,618 mg/dL Final  . IgA 03/03/2016 349  81 - 463 mg/dL Final  . IgM, Serum 03/03/2016 144  48 - 271 mg/dL Final  . Hepatitis B Surface Ag 03/02/2016 NEGATIVE  NEGATIVE Final  . HCV Ab 03/02/2016 NEGATIVE  NEGATIVE Final  . Hep B C IgM 03/02/2016 NON REACTIVE  NON REACTIVE Final   Comment: High levels of Hepatitis B Core IgM antibody are detectable during the acute stage of Hepatitis B. This antibody is used to differentiate current from past HBV infection.     . Hep A IgM 03/02/2016 NON REACTIVE  NON REACTIVE Final   Comment:   Effective March 23, 2014, Hepatitis Acute Panel (test code [id number]) will be revised to automatically reflex to the Hepatitis C Viral RNA, Quantitative, Real-Time PCR assay if the Hepatitis C antibody screening result is Reactive. This action is being taken to ensure that the CDC/USPSTF  recommended HCV diagnostic algorithm with the appropriate test reflex needed for accurate interpretation is followed.     Marland Kitchen HIV 1&2 Ab, 4th Generation 03/02/2016 NONREACTIVE  NONREACTIVE Final   Comment:   HIV-1 antigen and HIV-1/HIV-2 antibodies were not detected.  There is no laboratory evidence of HIV infection.   HIV-1/2 Antibody Diff        Not indicated. HIV-1 RNA, Qual TMA          Not  indicated.     PLEASE NOTE: This information has been disclosed to you from records whose confidentiality may be protected by state law. If your state requires such protection, then the state law prohibits you from making any further disclosure of the information without the specific written consent of the person to whom it pertains, or as otherwise permitted by law. A general authorization for the release of medical or other information is NOT sufficient for this purpose.   The performance of this assay has not been clinically validated in patients less than 28 years old.   For additional information please refer to http://education.quest[facility].com/faq/FAQ106.  (This link is being provided for informational/educational purposes only.)     . Interferon Gamma Release Assay 03/03/2016 NEGATIVE  NEGATIVE Final  . Quantiferon Nil Value 03/03/2016 0.02  IU/mL Final  . Mitogen-Nil 03/03/2016 9.12  IU/mL Final  . Quantiferon Tb Ag Minus Nil Value 03/03/2016 0.00  IU/mL Final   Comment:   The Nil tube value is used to determine if the patient has a preexisting immune response which could cause a false-positive reading on the test. In order for a test to be valid, the Nil tube must have a value of less than or equal to 8.0 IU/mL.   The mitogen control tube is used to assure the patient has a healthy immune status and also serves as a control for correct blood handling and incubation. It is used to detect false-negative readings. The mitogen tube must have a gamma interferon value of greater than or equal to 0.5 IU/mL higher than the value of the Nil tube.   The TB antigen tube is coated with the M. tuberculosis specific antigens. For a test to be considered positive, the TB antigen tube value minus the Nil tube value must be greater than or equal to 0.35 IU/mL.   For additional information, please refer to http://education.[facility].com/faq/QFT (This link is being provided  for informational/educational purposes only.)   . Total Protein, Serum Electrophores* 03/03/2016 7.3  6.1 - 8.1 g/dL Final  . Albumin ELP 03/03/2016 3.9  3.8 - 4.8 g/dL Final  . Alpha-1-Globulin 03/03/2016 0.3  0.2 - 0.3 g/dL Final  . Alpha-2-Globulin 03/03/2016 0.8  0.5 - 0.9 g/dL Final  . Beta Globulin 03/03/2016 0.5  0.4 - 0.6 g/dL Final  . Beta 2 03/03/2016 0.5  0.2 - 0.5 g/dL Final  . Gamma Globulin 03/03/2016 1.3  0.8 - 1.7 g/dL Final  . Abnormal Protein Band1 03/03/2016 NOT DET  g/dL Final  . SPE Interp. 03/03/2016 SEE NOTE   Final   Comment: Normal Electrophoretic Pattern Reviewed by Odis Hollingshead, MD, PhD, FCAP (Electronic Signature on File)   . Abnormal Protein Band2 03/03/2016 NOT DET  g/dL Final  . Abnormal Protein Band3 03/03/2016 NOT DET  g/dL Final  . WBC 03/02/2016 7.7  3.8 - 10.8 K/uL Final  . RBC 03/02/2016 4.91  3.80 - 5.10 MIL/uL Final  . Hemoglobin 03/02/2016 15.8* 11.7 - 15.5 g/dL Final  . HCT 03/02/2016 46.7*  35.0 - 45.0 % Final  . MCV 03/02/2016 95.1  80.0 - 100.0 fL Final  . MCH 03/02/2016 32.2  27.0 - 33.0 pg Final  . MCHC 03/02/2016 33.8  32.0 - 36.0 g/dL Final  . RDW 03/02/2016 14.5  11.0 - 15.0 % Final  . Platelets 03/02/2016 328  140 - 400 K/uL Final  . MPV 03/02/2016 10.4  7.5 - 12.5 fL Final  . Neutro Abs 03/02/2016 4312  1,500 - 7,800 cells/uL Final  . Lymphs Abs 03/02/2016 2541  850 - 3,900 cells/uL Final  . Monocytes Absolute 03/02/2016 616  200 - 950 cells/uL Final  . Eosinophils Absolute 03/02/2016 154  15 - 500 cells/uL Final  . Basophils Absolute 03/02/2016 77  0 - 200 cells/uL Final  . Neutrophils Relative % 03/02/2016 56  % Final  . Lymphocytes Relative 03/02/2016 33  % Final  . Monocytes Relative 03/02/2016 8  % Final  . Eosinophils Relative 03/02/2016 2  % Final  . Basophils Relative 03/02/2016 1  % Final  . Smear Review 03/02/2016 Criteria for review not met   Final  . Sodium 03/02/2016 140  135 - 146 mmol/L Final  . Potassium  03/02/2016 4.5  3.5 - 5.3 mmol/L Final  . Chloride 03/02/2016 104  98 - 110 mmol/L Final  . CO2 03/02/2016 24  20 - 31 mmol/L Final  . Glucose, Bld 03/02/2016 101* 65 - 99 mg/dL Final  . BUN 03/02/2016 14  7 - 25 mg/dL Final  . Creat 03/02/2016 1.01* 0.60 - 0.93 mg/dL Final   Comment:   For patients > or = 71 years of age: The upper reference limit for Creatinine is approximately 13% higher for people identified as African-American.     . Total Bilirubin 03/02/2016 0.5  0.2 - 1.2 mg/dL Final  . Alkaline Phosphatase 03/02/2016 112  33 - 130 U/L Final  . AST 03/02/2016 28  10 - 35 U/L Final  . ALT 03/02/2016 22  6 - 29 U/L Final  . Total Protein 03/02/2016 7.3  6.1 - 8.1 g/dL Final  . Albumin 03/02/2016 4.1  3.6 - 5.1 g/dL Final  . Calcium 03/02/2016 9.5  8.6 - 10.4 mg/dL Final  . GFR, Est African American 03/02/2016 65  >=60 mL/min Final  . GFR, Est Non African American 03/02/2016 56* >=60 mL/min Final  . TSH 03/02/2016 2.69  mIU/L Final   Comment:   Reference Range   > or = 20 Years  0.40-4.50   Pregnancy Range First trimester  0.26-2.66 Second trimester 0.55-2.73 Third trimester  0.43-2.91     . Sed Rate 03/02/2016 11  0 - 30 mm/hr Final  . Total CK 03/02/2016 73  7 - 177 U/L Final  . Anit Nuclear Antibody(ANA) 03/02/2016 NEG  NEGATIVE Final  . Uric Acid, Serum 03/02/2016 5.4  2.5 - 7.0 mg/dL Final  . Color, Urine 03/02/2016 DARK YELLOW  YELLOW Final  . APPearance 03/02/2016 CLOUDY* CLEAR Final  . Specific Gravity, Urine 03/02/2016 1.031  1.001 - 1.035 Final  . pH 03/02/2016 5.5  5.0 - 8.0 Final  . Glucose, UA 03/02/2016 NEGATIVE  NEGATIVE Final  . Bilirubin Urine 03/02/2016 NEGATIVE  NEGATIVE Final  . Ketones, ur 03/02/2016 TRACE* NEGATIVE Final  . Hgb urine dipstick 03/02/2016 NEGATIVE  NEGATIVE Final  . Protein, ur 03/02/2016 TRACE* NEGATIVE Final  . Nitrite 03/02/2016 NEGATIVE  NEGATIVE Final  . Leukocytes, UA 03/02/2016 1+* NEGATIVE Final      Imaging:  Speciality Comments: No specialty comments available.    Procedures:  No procedures performed Allergies: Codeine and Iodine   Assessment / Plan:     Visit Diagnoses: Rheumatoid arthritis of multiple sites with negative rheumatoid factor ([place]) - RF and CCP ordered on 04/14/16, according to patient  High risk medication use - [id number] tabs/wk, Folic acid 79m qd  Fibromyalgia  Primary osteoarthritis of both feet - Bilateral mild with calcaneal spurs, no erosive changes  Primary osteoarthritis of both hands - Bilateral mild with no erosive changes  Hypertension, unspecified type  Hypothyroidism, unspecified type  Type 2 diabetes mellitus without complication, without long-term current use of insulin ([facility])  Rheumatoid arthritis Patient reports long-standing history of rheumatoid arthritis for multiple years . She has been treated with methotrexate which has worked really well for her. She has no synovitis on examination. I will obtain baseline x-ray of her bilateral hands and feet today.  High risk medication use She is on methotrexate 4 tablets per week and is taking folic acid over-the-counter. She reports that she's been getting labs on regular basis.  I will obtain CBC, comprehensive metabolic panel, sedimentation rate, rheumatoid factor, CCP, ANA, hepatitis panel, HIV, TB gold today. Patient reports that she has had a chest x-ray in the last 3 years. She will update the status of pneumococcal vaccine and zoster vaccine with her PCP. I will refill her methotrexate once I get her lab results.  Fibromyalgia She has history of fibromyalgia. She has few tender points on examination and some generalized myalgias.  Foot pain She complains of some right foot pain but she has no synovitis on examination.  Trochanteric bursitis bilateral She has some discomfort and had recurrent injections in the past.  Other medical problems are as  follows: Hyperlipidemia, unspecified hyperlipidemia type  Hypertension, unspecified type  Hypothyroidism, unspecified type  Depression, unspecified depression type  Obesity (BMI 35.0-39.9 without comorbidity)  Type 2 diabetes mellitus without complication, without long-term current use of insulin ([place]   Orders: No orders of the defined types were placed in this encounter.  No orders of the defined types were placed in this encounter.   Face-to-face time spent with patient was *** minutes. 50% of time was spent in counseling and coordination of care.  Follow-Up Instructions: No Follow-up on file.   SBo Merino MD

## 2016-04-14 ENCOUNTER — Ambulatory Visit: Payer: Self-pay | Admitting: Rheumatology

## 2016-05-29 ENCOUNTER — Other Ambulatory Visit: Payer: Self-pay | Admitting: Rheumatology

## 2016-05-29 DIAGNOSIS — E119 Type 2 diabetes mellitus without complications: Secondary | ICD-10-CM | POA: Diagnosis not present

## 2016-05-29 DIAGNOSIS — Z79899 Other long term (current) drug therapy: Secondary | ICD-10-CM | POA: Diagnosis not present

## 2016-05-30 LAB — CBC WITH DIFFERENTIAL/PLATELET
Basophils Absolute: 0 10*3/uL (ref 0.0–0.2)
Basos: 1 %
EOS (ABSOLUTE): 0.1 10*3/uL (ref 0.0–0.4)
EOS: 2 %
HEMATOCRIT: 46.1 % (ref 34.0–46.6)
Hemoglobin: 15.2 g/dL (ref 11.1–15.9)
Immature Grans (Abs): 0 10*3/uL (ref 0.0–0.1)
Immature Granulocytes: 0 %
LYMPHS ABS: 2.6 10*3/uL (ref 0.7–3.1)
Lymphs: 31 %
MCH: 31.7 pg (ref 26.6–33.0)
MCHC: 33 g/dL (ref 31.5–35.7)
MCV: 96 fL (ref 79–97)
MONOS ABS: 0.6 10*3/uL (ref 0.1–0.9)
Monocytes: 7 %
Neutrophils Absolute: 5.2 10*3/uL (ref 1.4–7.0)
Neutrophils: 59 %
Platelets: 261 10*3/uL (ref 150–379)
RBC: 4.8 x10E6/uL (ref 3.77–5.28)
RDW: 15.4 % (ref 12.3–15.4)
WBC: 8.5 10*3/uL (ref 3.4–10.8)

## 2016-05-30 LAB — COMPREHENSIVE METABOLIC PANEL
A/G RATIO: 1.6 (ref 1.2–2.2)
ALK PHOS: 110 IU/L (ref 39–117)
ALT: 18 IU/L (ref 0–32)
AST: 19 IU/L (ref 0–40)
Albumin: 4.2 g/dL (ref 3.5–4.8)
BUN/Creatinine Ratio: 11 — ABNORMAL LOW (ref 12–28)
BUN: 11 mg/dL (ref 8–27)
Bilirubin Total: 0.4 mg/dL (ref 0.0–1.2)
CO2: 20 mmol/L (ref 18–29)
Calcium: 9.7 mg/dL (ref 8.7–10.3)
Chloride: 101 mmol/L (ref 96–106)
Creatinine, Ser: 1.04 mg/dL — ABNORMAL HIGH (ref 0.57–1.00)
GFR calc Af Amer: 62 mL/min/{1.73_m2} (ref >59)
GFR calc non Af Amer: 54 mL/min/{1.73_m2} — ABNORMAL LOW (ref >59)
GLOBULIN, TOTAL: 2.6 g/dL (ref 1.5–4.5)
Glucose: 125 mg/dL — ABNORMAL HIGH (ref 65–99)
Potassium: 4.1 mmol/L (ref 3.5–5.2)
SODIUM: 140 mmol/L (ref 134–144)
Total Protein: 6.8 g/dL (ref 6.0–8.5)

## 2016-05-30 NOTE — Progress Notes (Signed)
Labs are stable.

## 2016-05-31 ENCOUNTER — Ambulatory Visit: Payer: Medicare Other | Admitting: Rheumatology

## 2016-05-31 DIAGNOSIS — R7303 Prediabetes: Secondary | ICD-10-CM | POA: Insufficient documentation

## 2016-05-31 DIAGNOSIS — Z8639 Personal history of other endocrine, nutritional and metabolic disease: Secondary | ICD-10-CM

## 2016-05-31 DIAGNOSIS — Z8719 Personal history of other diseases of the digestive system: Secondary | ICD-10-CM

## 2016-05-31 HISTORY — DX: Personal history of other diseases of the digestive system: Z87.19

## 2016-05-31 HISTORY — DX: Personal history of other endocrine, nutritional and metabolic disease: Z86.39

## 2016-05-31 HISTORY — DX: Prediabetes: R73.03

## 2016-05-31 NOTE — Progress Notes (Signed)
Office Visit Note  Patient: Deborah Farley             Date of Birth: [birth year]           MRN: [id number]             PCP: Nicholos Johns, MD Referring: Jenean Lindau., MD Visit Date: 06/01/2016 Occupation: _0 @    Subjective:  Pain hands and ankles   History of Present Illness: Deborah Farley is a 71 y.o. female history of rheumatoid arthritis. She states she's been doing fairly well. She denies any joint swelling. She has intermittent pain in her hands and ankle joints. She's also had problems with costochondritis for the last 5 years off and on.   Activities of Daily Living:  Patient reports morning stiffness for 10 minutes.   Patient Denies nocturnal pain.  Difficulty dressing/grooming: Denies Difficulty climbing stairs: Reports Difficulty getting out of chair: Reports Difficulty using hands for taps, buttons, cutlery, and/or writing: Reports   Review of Systems  Constitutional: Positive for fatigue. Negative for night sweats, weight gain, weight loss and weakness.  HENT: Negative for mouth sores, trouble swallowing, trouble swallowing, mouth dryness and nose dryness.   Eyes: Negative for pain, redness, visual disturbance and dryness.  Respiratory: Negative for cough, shortness of breath and difficulty breathing.   Cardiovascular: Negative for chest pain, palpitations, hypertension, irregular heartbeat and swelling in legs/feet.  Gastrointestinal: Positive for constipation. Negative for blood in stool and diarrhea.  Endocrine: Negative for increased urination.  Genitourinary: Negative for vaginal dryness.  Musculoskeletal: Positive for arthralgias, joint pain and morning stiffness. Negative for joint swelling, myalgias, muscle weakness, muscle tenderness and myalgias.  Skin: Negative for color change, rash, hair loss, skin tightness, ulcers and sensitivity to sunlight.  Allergic/Immunologic: Negative for susceptible to infections.  Neurological: Negative for  dizziness, memory loss and night sweats.  Hematological: Negative for swollen glands.  Psychiatric/Behavioral: Positive for depressed mood. Negative for sleep disturbance. The patient is nervous/anxious.     PMFS History:  Patient Active Problem List   Diagnosis Date Noted  . History of diabetes mellitus 05/31/2016  . History of hyperthyroidism 05/31/2016  . History of gastroesophageal reflux (GERD) 05/31/2016  . Rheumatoid arthritis ([place]) 04/12/2016  . High risk medication use 04/12/2016  . Diabetes ([place]) 03/01/2016  . Hyperlipidemia 02/29/2016  . Hypertension 02/29/2016  . Hypothyroidism 02/29/2016  . Depression 02/29/2016  . Obesity (BMI 35.0-39.9 without comorbidity) 02/29/2016  . Acid reflux 02/29/2016  . Angina pectoris ([place]) 08/11/2015    Past Medical History:  Diagnosis Date  . Depression   . Diabetes mellitus without complication ([place])   . Heart disease   . Hernia, hiatal   . High cholesterol   . Hypertension   . Hypothyroidism   . Memory difficulty   . Spastic esophagus     No family history on file. Past Surgical History:  Procedure Laterality Date  . ABDOMINAL HYSTERECTOMY    . BACK SURGERY    . CARDIAC CATHETERIZATION N/A 08/12/2015   Procedure: Left Heart Cath and Coronary Angiography;  Surgeon: Adrian Prows, MD;  Location: [facility];  Service: Cardiovascular;  Laterality: N/A;  . TOTAL ABDOMINAL HYSTERECTOMY W/ BILATERAL SALPINGOOPHORECTOMY     Social History   Social History Narrative  . No narrative on file     Objective: Vital Signs: BP 112/70   Pulse 78   Resp 14   Wt 205 lb (93 kg)   BMI 36.31 kg/m    Physical Exam  Constitutional: She is oriented to person, place, and time. She appears well-developed and well-nourished.  HENT:  Head: Normocephalic and atraumatic.  Eyes: Conjunctivae and EOM are normal.  Neck: Normal range of motion.  Cardiovascular: Normal rate, regular rhythm, normal heart sounds and intact distal pulses.     Pulmonary/Chest: Effort normal and breath sounds normal.  Abdominal: Soft. Bowel sounds are normal.  Lymphadenopathy:    She has no cervical adenopathy.  Neurological: She is alert and oriented to person, place, and time.  Skin: Skin is warm and dry. Capillary refill takes less than 2 seconds.  Psychiatric: She has a normal mood and affect. Her behavior is normal.  Nursing note and vitals reviewed.    Musculoskeletal Exam: C-spine and thoracic lumbar spine good range of motion she has some limitation with range of motion of her thoracic spine. Shoulder joints although joints wrist joints MCPs PIPs DIPs with good range of motion. She is some tenderness over her wrist joint and MCP joints. Hip joints knee joints ankles MTPs PIPs with good range of motion. She tenderness on palpation over bilateral ankle joints.  CDAI Exam: CDAI Homunculus Exam:   Tenderness:  RUE: wrist  Joint Counts:  CDAI Tender Joint count: 1 CDAI Swollen Joint count: 0  Global Assessments:  Patient Global Assessment: 5 Provider Global Assessment: 4  CDAI Calculated Score: 10    Investigation: Findings:  10/25/17TB gold and hepatitis panel negative ,HIV neg, SPEP n, Igs Normal,TSH normal,ESR11, UA neg 05/29/16 CBC , CMP normal  Orders Only on 05/29/2016  Component Date Value Ref Range Status  . WBC 05/29/2016 8.5  3.4 - 10.8 x10E3/uL Final  . RBC 05/29/2016 4.80  3.77 - 5.28 x10E6/uL Final  . Hemoglobin 05/29/2016 15.2  11.1 - 15.9 g/dL Final  . Hematocrit 05/29/2016 46.1  34.0 - 46.6 % Final  . MCV 05/29/2016 96  79 - 97 fL Final  . MCH 05/29/2016 31.7  26.6 - 33.0 pg Final  . MCHC 05/29/2016 33.0  31.5 - 35.7 g/dL Final  . RDW 05/29/2016 15.4  12.3 - 15.4 % Final  . Platelets 05/29/2016 261  150 - 379 x10E3/uL Final  . Neutrophils 05/29/2016 59  Not Estab. % Final  . Lymphs 05/29/2016 31  Not Estab. % Final  . Monocytes 05/29/2016 7  Not Estab. % Final  . Eos 05/29/2016 2  Not Estab. % Final  .  Basos 05/29/2016 1  Not Estab. % Final  . Neutrophils Absolute 05/29/2016 5.2  1.4 - 7.0 x10E3/uL Final  . Lymphocytes Absolute 05/29/2016 2.6  0.7 - 3.1 x10E3/uL Final  . Monocytes Absolute 05/29/2016 0.6  0.1 - 0.9 x10E3/uL Final  . EOS (ABSOLUTE) 05/29/2016 0.1  0.0 - 0.4 x10E3/uL Final  . Basophils Absolute 05/29/2016 0.0  0.0 - 0.2 x10E3/uL Final  . Immature Granulocytes 05/29/2016 0  Not Estab. % Final  . Immature Grans (Abs) 05/29/2016 0.0  0.0 - 0.1 x10E3/uL Final  . Glucose 05/29/2016 125* 65 - 99 mg/dL Final  . BUN 05/29/2016 11  8 - 27 mg/dL Final  . Creatinine, Ser 05/29/2016 1.04* 0.57 - 1.00 mg/dL Final  . GFR calc non Af Amer 05/29/2016 54* >59 mL/min/1.73 Final  . GFR calc Af Amer 05/29/2016 62  >59 mL/min/1.73 Final  . BUN/Creatinine Ratio 05/29/2016 11* 12 - 28 Final  . Sodium 05/29/2016 140  134 - 144 mmol/L Final  . Potassium 05/29/2016 4.1  3.5 - 5.2 mmol/L Final  . Chloride 05/29/2016 101  96 - 106 mmol/L Final  . CO2 05/29/2016 20  18 - 29 mmol/L Final  . Calcium 05/29/2016 9.7  8.7 - 10.3 mg/dL Final  . Total Protein 05/29/2016 6.8  6.0 - 8.5 g/dL Final  . Albumin 05/29/2016 4.2  3.5 - 4.8 g/dL Final  . Globulin, Total 05/29/2016 2.6  1.5 - 4.5 g/dL Final  . Albumin/Globulin Ratio 05/29/2016 1.6  1.2 - 2.2 Final  . Bilirubin Total 05/29/2016 0.4  0.0 - 1.2 mg/dL Final  . Alkaline Phosphatase 05/29/2016 110  39 - 117 IU/L Final  . AST 05/29/2016 19  0 - 40 IU/L Final  . ALT 05/29/2016 18  0 - 32 IU/L Final  Office Visit on 03/01/2016  Component Date Value Ref Range Status  . IgG (Immunoglobin G), Serum 03/01/2016 1378  694 - 1,618 mg/dL Final  . IgA 03/01/2016 349  81 - 463 mg/dL Final  . IgM, Serum 03/01/2016 144  48 - 271 mg/dL Final  . Hepatitis B Surface Ag 03/01/2016 NEGATIVE  NEGATIVE Final  . HCV Ab 03/01/2016 NEGATIVE  NEGATIVE Final  . Hep B C IgM 03/01/2016 NON REACTIVE  NON REACTIVE Final   Comment: High levels of Hepatitis B Core IgM antibody are  detectable during the acute stage of Hepatitis B. This antibody is used to differentiate current from past HBV infection.     . Hep A IgM 03/01/2016 NON REACTIVE  NON REACTIVE Final   Comment:   Effective March 23, 2014, Hepatitis Acute Panel (test code [id number]) will be revised to automatically reflex to the Hepatitis C Viral RNA, Quantitative, Real-Time PCR assay if the Hepatitis C antibody screening result is Reactive. This action is being taken to ensure that the CDC/USPSTF recommended HCV diagnostic algorithm with the appropriate test reflex needed for accurate interpretation is followed.     [place] Kitchen HIV 1&2 Ab, 4th Generation 03/01/2016 NONREACTIVE  NONREACTIVE Final   Comment:   HIV-1 antigen and HIV-1/HIV-2 antibodies were not detected.  There is no laboratory evidence of HIV infection.   HIV-1/2 Antibody Diff        Not indicated. HIV-1 RNA, Qual TMA          Not indicated.     PLEASE NOTE: This information has been disclosed to you from records whose confidentiality may be protected by state law. If your state requires such protection, then the state law prohibits you from making any further disclosure of the information without the specific written consent of the person to whom it pertains, or as otherwise permitted by law. A general authorization for the release of medical or other information is NOT sufficient for this purpose.   The performance of this assay has not been clinically validated in patients less than 1 years old.   For additional information please refer to http://education.questdiagnostics.com/faq/FAQ106.  (This link is being provided for informational/educational purposes only.)     . Interferon Gamma Release Assay 03/01/2016 NEGATIVE  NEGATIVE Final  . Quantiferon Nil Value 03/01/2016 0.02  IU/mL Final  . Mitogen-Nil 03/01/2016 9.12  IU/mL Final  . Quantiferon Tb Ag Minus Nil Value 03/01/2016 0.00  IU/mL Final   Comment:   The Nil tube value is  used to determine if the patient has a preexisting immune response which could cause a false-positive reading on the test. In order for a test to be valid, the Nil tube must have a value of less than or equal to 8.0 IU/mL.   The mitogen control tube  is used to assure the patient has a healthy immune status and also serves as a control for correct blood handling and incubation. It is used to detect false-negative readings. The mitogen tube must have a gamma interferon value of greater than or equal to 0.5 IU/mL higher than the value of the Nil tube.   The TB antigen tube is coated with the M. tuberculosis specific antigens. For a test to be considered positive, the TB antigen tube value minus the Nil tube value must be greater than or equal to 0.35 IU/mL.   For additional information, please refer to http://education.questdiagn[facility].com/faq/QFT (This link is being provided for informational/educational purposes only.)   . Total Protein, Serum Electrophores* 03/01/2016 7.3  6.1 - 8.1 g/dL Final  . Albumin ELP 03/01/2016 3.9  3.8 - 4.8 g/dL Final  . Alpha-1-Globulin 03/01/2016 0.3  0.2 - 0.3 g/dL Final  . Alpha-2-Globulin 03/01/2016 0.8  0.5 - 0.9 g/dL Final  . Beta Globulin 03/01/2016 0.5  0.4 - 0.6 g/dL Final  . Beta 2 03/01/2016 0.5  0.2 - 0.5 g/dL Final  . Gamma Globulin 03/01/2016 1.3  0.8 - 1.7 g/dL Final  . Abnormal Protein Band1 03/01/2016 NOT DET  g/dL Final  . SPE Interp. 03/01/2016 SEE NOTE   Final   Comment: Normal Electrophoretic Pattern Reviewed by Odis Hollingshead, MD, PhD, FCAP (Electronic Signature on File)   . Abnormal Protein Band2 03/01/2016 NOT DET  g/dL Final  . Abnormal Protein Band3 03/01/2016 NOT DET  g/dL Final  . WBC 03/01/2016 7.7  3.8 - 10.8 K/uL Final  . RBC 03/01/2016 4.91  3.80 - 5.10 MIL/uL Final  . Hemoglobin 03/01/2016 15.8* 11.7 - 15.5 g/dL Final  . HCT 03/01/2016 46.7* 35.0 - 45.0 % Final  . MCV 03/01/2016 95.1  80.0 - 100.0 fL Final  . MCH  03/01/2016 32.2  27.0 - 33.0 pg Final  . MCHC 03/01/2016 33.8  32.0 - 36.0 g/dL Final  . RDW 03/01/2016 14.5  11.0 - 15.0 % Final  . Platelets 03/01/2016 328  140 - 400 K/uL Final  . MPV 03/01/2016 10.4  7.5 - 12.5 fL Final  . Neutro Abs 03/01/2016 4312  1,500 - 7,800 cells/uL Final  . Lymphs Abs 03/01/2016 2541  850 - 3,900 cells/uL Final  . Monocytes Absolute 03/01/2016 616  200 - 950 cells/uL Final  . Eosinophils Absolute 03/01/2016 154  15 - 500 cells/uL Final  . Basophils Absolute 03/01/2016 77  0 - 200 cells/uL Final  . Neutrophils Relative % 03/01/2016 56  % Final  . Lymphocytes Relative 03/01/2016 33  % Final  . Monocytes Relative 03/01/2016 8  % Final  . Eosinophils Relative 03/01/2016 2  % Final  . Basophils Relative 03/01/2016 1  % Final  . Smear Review 03/01/2016 Criteria for review not met   Final  . Sodium 03/01/2016 140  135 - 146 mmol/L Final  . Potassium 03/01/2016 4.5  3.5 - 5.3 mmol/L Final  . Chloride 03/01/2016 104  98 - 110 mmol/L Final  . CO2 03/01/2016 24  20 - 31 mmol/L Final  . Glucose, Bld 03/01/2016 101* 65 - 99 mg/dL Final  . BUN 03/01/2016 14  7 - 25 mg/dL Final  . Creat 03/01/2016 1.01* 0.60 - 0.93 mg/dL Final   Comment:   For patients > or = 72 years of age: The upper reference limit for Creatinine is approximately 13% higher for people identified as African-American.     . Total Bilirubin  03/01/2016 0.5  0.2 - 1.2 mg/dL Final  . Alkaline Phosphatase 03/01/2016 112  33 - 130 U/L Final  . AST 03/01/2016 28  10 - 35 U/L Final  . ALT 03/01/2016 22  6 - 29 U/L Final  . Total Protein 03/01/2016 7.3  6.1 - 8.1 g/dL Final  . Albumin 03/01/2016 4.1  3.6 - 5.1 g/dL Final  . Calcium 03/01/2016 9.5  8.6 - 10.4 mg/dL Final  . GFR, Est African American 03/01/2016 65  >=60 mL/min Final  . GFR, Est Non African American 03/01/2016 56* >=60 mL/min Final  . TSH 03/01/2016 2.69  mIU/L Final   Comment:   Reference Range   > or = 20 Years  0.40-4.50   Pregnancy  Range First trimester  0.26-2.66 Second trimester 0.55-2.73 Third trimester  0.43-2.91     . Sed Rate 03/01/2016 11  0 - 30 mm/hr Final  . Total CK 03/01/2016 73  7 - 177 U/L Final  . Anit Nuclear Antibody(ANA) 03/01/2016 NEG  NEGATIVE Final  . Uric Acid, Serum 03/01/2016 5.4  2.5 - 7.0 mg/dL Final  . Color, Urine 03/01/2016 DARK YELLOW  YELLOW Final  . APPearance 03/01/2016 CLOUDY* CLEAR Final  . Specific Gravity, Urine 03/01/2016 1.031  1.001 - 1.035 Final  . pH 03/01/2016 5.5  5.0 - 8.0 Final  . Glucose, UA 03/01/2016 NEGATIVE  NEGATIVE Final  . Bilirubin Urine 03/01/2016 NEGATIVE  NEGATIVE Final  . Ketones, ur 03/01/2016 TRACE* NEGATIVE Final  . Hgb urine dipstick 03/01/2016 NEGATIVE  NEGATIVE Final  . Protein, ur 03/01/2016 TRACE* NEGATIVE Final  . Nitrite 03/01/2016 NEGATIVE  NEGATIVE Final  . Leukocytes, UA 03/01/2016 1+* NEGATIVE Final     Imaging: No results found.  Speciality Comments: No specialty comments available.    Procedures:  No procedures performed Allergies: Codeine and Iodine   Assessment / Plan:     Visit Diagnoses: Rheumatoid arthritis of multiple sites with negative rheumatoid factor (HCC)I do not have previous serology on the patient. We will check her rheumatoid factor and CCP with her next labs. She states she has intermittent swelling in her wrist joint and also ankle joints. She had no synovitis on examination today. As she is having intermittent flares I will schedule ultrasound of her bilateral hands do well with this further. I will also add Plaquenil to control her arthritis better. Indications side effects contraindications were discussed at length. Handout was given consent was taken. The plan is to start her on Plaquenil 200 mg by mouth daily. I will check her labs in 6 weeks and then every 3 months as a scheduled.  High risk medication use - Methotrexate 4/week and folic acid 1 mg by mouth daily.  Patient complains of some intermittent  costochondritis. She had mild tenderness over the sternum  History of diabetes mellitus  History of hyperthyroidism  History of gastroesophageal reflux (GERD)  Obesity (BMI 35.0-39.9 without comorbidity)  Depression, unspecified depression type    Orders: Orders Placed This Encounter  Procedures  . CBC with Differential/Platelet  . COMPLETE METABOLIC PANEL WITH GFR   Meds ordered this encounter  Medications  . methotrexate (RHEUMATREX) 2.5 MG tablet    Sig: Take 4 tablets (10 mg total) by mouth once a week. Caution:Chemotherapy. Protect from light.    Dispense:  60 tablet    Refill:  0  . hydroxychloroquine (PLAQUENIL) 200 MG tablet    Sig: Take 1 tablet (200 mg total) by mouth daily.    Dispense:  90 tablet    Refill:  0    Face-to-face time spent with patient was 30 minutes. 50% of time was spent in counseling and coordination of care.  Follow-Up Instructions: Return in about 3 months (around 08/30/2016) for Rheumatoid arthritis.   Bo Merino, MD  Note - This record has been created using Editor, commissioning.  Chart creation errors have been sought, but may not always  have been located. Such creation errors do not reflect on  the standard of medical care.

## 2016-06-01 ENCOUNTER — Ambulatory Visit (INDEPENDENT_AMBULATORY_CARE_PROVIDER_SITE_OTHER): Payer: Medicare Other | Admitting: Rheumatology

## 2016-06-01 ENCOUNTER — Encounter: Payer: Self-pay | Admitting: Rheumatology

## 2016-06-01 VITALS — BP 112/70 | HR 78 | Resp 14 | Wt 205.0 lb

## 2016-06-01 DIAGNOSIS — Z8639 Personal history of other endocrine, nutritional and metabolic disease: Secondary | ICD-10-CM

## 2016-06-01 DIAGNOSIS — Z8719 Personal history of other diseases of the digestive system: Secondary | ICD-10-CM

## 2016-06-01 DIAGNOSIS — M0609 Rheumatoid arthritis without rheumatoid factor, multiple sites: Secondary | ICD-10-CM

## 2016-06-01 DIAGNOSIS — I10 Essential (primary) hypertension: Secondary | ICD-10-CM | POA: Diagnosis not present

## 2016-06-01 DIAGNOSIS — F32A Depression, unspecified: Secondary | ICD-10-CM

## 2016-06-01 DIAGNOSIS — E669 Obesity, unspecified: Secondary | ICD-10-CM

## 2016-06-01 DIAGNOSIS — E785 Hyperlipidemia, unspecified: Secondary | ICD-10-CM | POA: Diagnosis not present

## 2016-06-01 DIAGNOSIS — F329 Major depressive disorder, single episode, unspecified: Secondary | ICD-10-CM

## 2016-06-01 DIAGNOSIS — Z79899 Other long term (current) drug therapy: Secondary | ICD-10-CM

## 2016-06-01 MED ORDER — HYDROXYCHLOROQUINE SULFATE 200 MG PO TABS: 200.0000 mg | ORAL_TABLET | Freq: Every day | ORAL | 0 refills | Status: DC

## 2016-06-01 MED ORDER — METHOTREXATE 2.5 MG PO TABS: 10.0000 mg | ORAL_TABLET | ORAL | 0 refills | Status: DC

## 2016-06-01 NOTE — Progress Notes (Signed)
Pharmacy Note  Subjective: Patient presents today to the [facility] to see Dr. Corliss Skains.  Patient seen by the pharmacist for counseling on hydroxychloroquine.    Objective: CMP Latest Ref Rng & Units 05/29/2016 03/01/2016 08/12/2015  Glucose 65 - 99 mg/dL 408(X) 448(J) 856(D)  BUN 8 - 27 mg/dL 11 14 12   Creatinine 0.57 - 1.00 mg/dL ) 1.49(F) 0.26(V)  Sodium 134 - 144 mmol/L 140 140 141  Potassium 3.5 - 5.2 mmol/L 4.1 4.5 3.7  Chloride 96 - 106 mmol/L 101 104 106  CO2 18 - 29 mmol/L 20 24 24   Calcium 8.7 - 10.3 mg/dL 9.7 9.5 9.2  Total Protein 6.0 - 8.5 g/dL 6.8 7.3 -  Total Bilirubin 0.0 - 1.2 mg/dL 0.4 0.5 -  Alkaline Phos 39 - 117 IU/L 110 112 -  AST 0 - 40 IU/L 19 28 -  ALT 0 - 32 IU/L 18 22 -    CBC    Component Value Date/Time   WBC 8.5 05/29/2016 0000   WBC 7.7 03/01/2016 1047   RBC 4.80 05/29/2016 0000   RBC 4.91 03/01/2016 1047   HGB 15.8 (H) 03/01/2016 1047   HCT 46.1 05/29/2016 0000   PLT 261 05/29/2016 0000   MCV 96 05/29/2016 0000   MCH 31.7 05/29/2016 0000   MCH 32.2 03/01/2016 1047   MCHC 33.0 05/29/2016 0000   MCHC 33.8 03/01/2016 1047   RDW 15.4 05/29/2016 0000   LYMPHSABS 2.6 05/29/2016 0000   MONOABS 616 03/01/2016 1047   EOSABS 0.1 05/29/2016 0000   BASOSABS 0.0 05/29/2016 0000    Assessment/Plan: Patient was prescribed hydroxychloroquine 200 mg daily.  Patient was counseled on the purpose, proper use, and adverse effects of hydroxychloroquine including nausea/diarrhea, skin rash, headaches, and sun sensitivity.  Discussed importance of annual eye exams while on hydroxychloroquine to monitor to ocular toxicity and discussed importance of frequent laboratory monitoring.  Patient was advised to get lab work 1 month after starting hydroxychloroquine.  Provided patient with eye exam form for baseline ophthalmologic exam and standing lab instructions.  Provided patient with educational materials on hydroxychloroquine and answered all  questions.  Patient consented to hydroxychloroquine.  Will upload consent in the media tab.    05/31/2016, Pharm.D., BCPS Clinical Pharmacist Pager: [phone] Phone: [phone] 06/01/2016 11:36 AM

## 2016-06-01 NOTE — Patient Instructions (Addendum)
Hydroxychloroquine tablets  RETURN IN ONE MONTH FOR REPEAT LABS/ AND GO FOR YOUR EYE EXAM, YOU WILL NEED THIS BASELINE AND THEN EVERY YEAR     What is this medicine? HYDROXYCHLOROQUINE (hye drox ee KLOR oh kwin) is used to treat rheumatoid arthritis and systemic lupus erythematosus. It is also used to treat malaria. This medicine may be used for other purposes; ask your health care provider or pharmacist if you have questions. COMMON BRAND NAME(S): Plaquenil, Quineprox What should I tell my health care provider before I take this medicine? They need to know if you have any of these conditions: -diabetes -eye disease, vision problems -G6PD deficiency -history of blood diseases -history of irregular heartbeat -if you often drink alcohol -kidney disease -liver disease -porphyria -psoriasis -seizures -an unusual or allergic reaction to chloroquine, hydroxychloroquine, other medicines, foods, dyes, or preservatives -pregnant or trying to get pregnant -breast-feeding How should I use this medicine? Take this medicine by mouth with a glass of water. Follow the directions on the prescription label. Avoid taking antacids within 4 hours of taking this medicine. It is best to separate these medicines by at least 4 hours. Do not cut, crush or chew this medicine. You can take it with or without food. If it upsets your stomach, take it with food. Take your medicine at regular intervals. Do not take your medicine more often than directed. Take all of your medicine as directed even if you think you are better. Do not skip doses or stop your medicine early. Talk to your pediatrician regarding the use of this medicine in children. While this drug may be prescribed for selected conditions, precautions do apply. Overdosage: If you think you have taken too much of this medicine contact a poison control center or emergency room at once. NOTE: This medicine is only for you. Do not share this  medicine with others. What if I miss a dose? If you miss a dose, take it as soon as you can. If it is almost time for your next dose, take only that dose. Do not take double or extra doses. What may interact with this medicine? Do not take this medicine with any of the following medications: -cisapride -dofetilide -dronedarone -live virus vaccines -penicillamine -pimozide -thioridazine -ziprasidone This medicine may also interact with the following medications: -ampicillin -antacids -cimetidine -cyclosporine -digoxin -medicines for diabetes, like insulin, glipizide, glyburide -medicines for seizures like carbamazepine, phenobarbital, phenytoin -mefloquine -methotrexate -other medicines that prolong the QT interval (cause an abnormal heart rhythm) -praziquantel This list may not describe all possible interactions. Give your health care provider a list of all the medicines, herbs, non-prescription drugs, or dietary supplements you use. Also tell them if you smoke, drink alcohol, or use illegal drugs. Some items may interact with your medicine. What should I watch for while using this medicine? Tell your doctor or healthcare professional if your symptoms do not start to get better or if they get worse. Avoid taking antacids within 4 hours of taking this medicine. It is best to separate these medicines by at least 4 hours. Tell your doctor or health care professional right away if you have any change in your eyesight. Your vision and blood may be tested before and during use of this medicine. This medicine can make you more sensitive to the sun. Keep out of the sun. If you cannot avoid being in the sun, wear protective clothing and use sunscreen. Do not use sun lamps or tanning beds/booths.  What side effects may I notice from receiving this medicine? Side effects that you should report to your doctor or health care professional as soon as possible: -allergic reactions like skin rash,  itching or hives, swelling of the face, lips, or tongue -changes in vision -decreased hearing or ringing of the ears -redness, blistering, peeling or loosening of the skin, including inside the mouth -seizures -sensitivity to light -signs and symptoms of a dangerous change in heartbeat or heart rhythm like chest pain; dizziness; fast or irregular heartbeat; palpitations; feeling faint or lightheaded, falls; breathing problems -signs and symptoms of liver injury like dark yellow or brown urine; general ill feeling or flu-like symptoms; light-colored stools; loss of appetite; nausea; right upper belly pain; unusually weak or tired; yellowing of the eyes or skin -signs and symptoms of low blood sugar such as feeling anxious; confusion; dizziness; increased hunger; unusually weak or tired; sweating; shakiness; cold; irritable; headache; blurred vision; fast heartbeat; loss of consciousness -uncontrollable head, mouth, neck, arm, or leg movements Side effects that usually do not require medical attention (report to your doctor or health care professional if they continue or are bothersome): -anxious -diarrhea -dizziness -hair loss -headache -irritable -loss of appetite -nausea, vomiting -stomach pain This list may not describe all possible side effects. Call your doctor for medical advice about side effects. You may report side effects to FDA at 1-800-FDA-1088. Where should I keep my medicine? Keep out of the reach of children. In children, this medicine can cause overdose with small doses. Store at room temperature between 15 and 30 degrees C (59 and 86 degrees F). Protect from moisture and light. Throw away any unused medicine after the expiration date. NOTE: This sheet is a summary. It may not cover all possible information. If you have questions about this medicine, talk to your doctor, pharmacist, or health care provider.  2017 Elsevier/Gold Standard (2015-12-08 14:16:15)     Orders  Only on 05/29/2016  Component Date Value Ref Range Status  . WBC 05/29/2016 8.5  3.4 - 10.8 x10E3/uL Final  . RBC 05/29/2016 4.80  3.77 - 5.28 x10E6/uL Final  . Hemoglobin 05/29/2016 15.2  11.1 - 15.9 g/dL Final  . Hematocrit 62/37/6283 46.1  34.0 - 46.6 % Final  . MCV 05/29/2016 96  79 - 97 fL Final  . MCH 05/29/2016 31.7  26.6 - 33.0 pg Final  . MCHC 05/29/2016 33.0  31.5 - 35.7 g/dL Final  . RDW 15/17/6160 15.4  12.3 - 15.4 % Final  . Platelets 05/29/2016 261  150 - 379 x10E3/uL Final  . Neutrophils 05/29/2016 59  Not Estab. % Final  . Lymphs 05/29/2016 31  Not Estab. % Final  . Monocytes 05/29/2016 7  Not Estab. % Final  . Eos 05/29/2016 2  Not Estab. % Final  . Basos 05/29/2016 1  Not Estab. % Final  . Neutrophils Absolute 05/29/2016 5.2  1.4 - 7.0 x10E3/uL Final  . Lymphocytes Absolute 05/29/2016 2.6  0.7 - 3.1 x10E3/uL Final  . Monocytes Absolute 05/29/2016 0.6  0.1 - 0.9 x10E3/uL Final  . EOS (ABSOLUTE) 05/29/2016 0.1  0.0 - 0.4 x10E3/uL Final  . Basophils Absolute 05/29/2016 0.0  0.0 - 0.2 x10E3/uL Final  . Immature Granulocytes 05/29/2016 0  Not Estab. % Final  . Immature Grans (Abs) 05/29/2016 0.0  0.0 - 0.1 x10E3/uL Final  . Glucose 05/29/2016 125* 65 - 99 mg/dL Final  . BUN 73/71/0626 11  8 - 27 mg/dL Final  . Creatinine, Ser 05/29/2016  1.04* 0.57 - 1.00 mg/dL Final  . GFR calc non Af Amer 05/29/2016 54* >59 mL/min/1.73 Final  . GFR calc Af Amer 05/29/2016 62  >59 mL/min/1.73 Final  . BUN/Creatinine Ratio 05/29/2016 11* 12 - 28 Final  . Sodium 05/29/2016 140  134 - 144 mmol/L Final  . Potassium 05/29/2016 4.1  3.5 - 5.2 mmol/L Final  . Chloride 05/29/2016 101  96 - 106 mmol/L Final  . CO2 05/29/2016 20  18 - 29 mmol/L Final  . Calcium 05/29/2016 9.7  8.7 - 10.3 mg/dL Final  . Total Protein 05/29/2016 6.8  6.0 - 8.5 g/dL Final  . Albumin 16/02/9603 4.2  3.5 - 4.8 g/dL Final  . Globulin, Total 05/29/2016 2.6  1.5 - 4.5 g/dL Final  . Albumin/Globulin Ratio 05/29/2016  1.6  1.2 - 2.2 Final  . Bilirubin Total 05/29/2016 0.4  0.0 - 1.2 mg/dL Final  . Alkaline Phosphatase 05/29/2016 110  39 - 117 IU/L Final  . AST 05/29/2016 19  0 - 40 IU/L Final  . ALT 05/29/2016 18  0 - 32 IU/L Final

## 2016-06-14 DIAGNOSIS — E119 Type 2 diabetes mellitus without complications: Secondary | ICD-10-CM | POA: Diagnosis not present

## 2016-06-14 DIAGNOSIS — E785 Hyperlipidemia, unspecified: Secondary | ICD-10-CM | POA: Diagnosis not present

## 2016-06-14 DIAGNOSIS — I10 Essential (primary) hypertension: Secondary | ICD-10-CM | POA: Diagnosis not present

## 2016-07-03 ENCOUNTER — Telehealth: Payer: Self-pay | Admitting: Rheumatology

## 2016-07-03 DIAGNOSIS — E119 Type 2 diabetes mellitus without complications: Secondary | ICD-10-CM | POA: Diagnosis not present

## 2016-07-03 NOTE — Telephone Encounter (Signed)
Patient called and was inquiring about

## 2016-07-03 NOTE — Telephone Encounter (Signed)
Message made in error

## 2016-07-07 ENCOUNTER — Other Ambulatory Visit: Payer: Self-pay | Admitting: Rheumatology

## 2016-07-07 DIAGNOSIS — Z79899 Other long term (current) drug therapy: Secondary | ICD-10-CM | POA: Diagnosis not present

## 2016-07-08 DIAGNOSIS — E119 Type 2 diabetes mellitus without complications: Secondary | ICD-10-CM | POA: Diagnosis not present

## 2016-07-08 DIAGNOSIS — K5900 Constipation, unspecified: Secondary | ICD-10-CM | POA: Diagnosis not present

## 2016-07-08 DIAGNOSIS — R0602 Shortness of breath: Secondary | ICD-10-CM | POA: Diagnosis not present

## 2016-07-08 DIAGNOSIS — Z79899 Other long term (current) drug therapy: Secondary | ICD-10-CM | POA: Diagnosis not present

## 2016-07-08 DIAGNOSIS — I10 Essential (primary) hypertension: Secondary | ICD-10-CM | POA: Diagnosis not present

## 2016-07-08 DIAGNOSIS — R079 Chest pain, unspecified: Secondary | ICD-10-CM | POA: Diagnosis not present

## 2016-07-08 DIAGNOSIS — E039 Hypothyroidism, unspecified: Secondary | ICD-10-CM | POA: Diagnosis not present

## 2016-07-08 DIAGNOSIS — R0600 Dyspnea, unspecified: Secondary | ICD-10-CM | POA: Diagnosis not present

## 2016-07-08 DIAGNOSIS — I200 Unstable angina: Secondary | ICD-10-CM | POA: Diagnosis not present

## 2016-07-08 LAB — CBC WITH DIFFERENTIAL/PLATELET
BASOS ABS: 0.1 10*3/uL (ref 0.0–0.2)
BASOS: 1 %
EOS (ABSOLUTE): 0.2 10*3/uL (ref 0.0–0.4)
Eos: 3 %
Hematocrit: 44.2 % (ref 34.0–46.6)
Hemoglobin: 15.2 g/dL (ref 11.1–15.9)
IMMATURE GRANS (ABS): 0 10*3/uL (ref 0.0–0.1)
IMMATURE GRANULOCYTES: 0 %
LYMPHS: 34 %
Lymphocytes Absolute: 2.5 10*3/uL (ref 0.7–3.1)
MCH: 33.2 pg — ABNORMAL HIGH (ref 26.6–33.0)
MCHC: 34.4 g/dL (ref 31.5–35.7)
MCV: 97 fL (ref 79–97)
Monocytes Absolute: 0.6 10*3/uL (ref 0.1–0.9)
Monocytes: 8 %
NEUTROS PCT: 54 %
Neutrophils Absolute: 4 10*3/uL (ref 1.4–7.0)
PLATELETS: 285 10*3/uL (ref 150–379)
RBC: 4.58 x10E6/uL (ref 3.77–5.28)
RDW: 15 % (ref 12.3–15.4)
WBC: 7.4 10*3/uL (ref 3.4–10.8)

## 2016-07-08 LAB — COMPREHENSIVE METABOLIC PANEL
ALT: 24 IU/L (ref 0–32)
AST: 27 IU/L (ref 0–40)
Albumin/Globulin Ratio: 1.6 (ref 1.2–2.2)
Albumin: 4.2 g/dL (ref 3.5–4.8)
Alkaline Phosphatase: 105 IU/L (ref 39–117)
BUN/Creatinine Ratio: 11 — ABNORMAL LOW (ref 12–28)
BUN: 11 mg/dL (ref 8–27)
Bilirubin Total: 0.4 mg/dL (ref 0.0–1.2)
CHLORIDE: 105 mmol/L (ref 96–106)
CO2: 22 mmol/L (ref 18–29)
Calcium: 9.7 mg/dL (ref 8.7–10.3)
Creatinine, Ser: 1.04 mg/dL — ABNORMAL HIGH (ref 0.57–1.00)
GFR, EST AFRICAN AMERICAN: 62 mL/min/{1.73_m2} (ref >59)
GFR, EST NON AFRICAN AMERICAN: 54 mL/min/{1.73_m2} — AB (ref >59)
GLUCOSE: 105 mg/dL — AB (ref 65–99)
Globulin, Total: 2.7 g/dL (ref 1.5–4.5)
Potassium: 4.6 mmol/L (ref 3.5–5.2)
Sodium: 144 mmol/L (ref 134–144)
TOTAL PROTEIN: 6.9 g/dL (ref 6.0–8.5)

## 2016-07-09 DIAGNOSIS — R079 Chest pain, unspecified: Secondary | ICD-10-CM | POA: Diagnosis not present

## 2016-07-09 DIAGNOSIS — E039 Hypothyroidism, unspecified: Secondary | ICD-10-CM | POA: Diagnosis not present

## 2016-07-10 NOTE — Progress Notes (Signed)
Labs stable

## 2016-07-11 ENCOUNTER — Telehealth: Payer: Self-pay | Admitting: Rheumatology

## 2016-07-11 DIAGNOSIS — Q245 Malformation of coronary vessels: Secondary | ICD-10-CM | POA: Diagnosis not present

## 2016-07-11 DIAGNOSIS — R079 Chest pain, unspecified: Secondary | ICD-10-CM | POA: Diagnosis not present

## 2016-07-11 DIAGNOSIS — E119 Type 2 diabetes mellitus without complications: Secondary | ICD-10-CM | POA: Diagnosis not present

## 2016-07-11 DIAGNOSIS — E785 Hyperlipidemia, unspecified: Secondary | ICD-10-CM | POA: Diagnosis not present

## 2016-07-11 DIAGNOSIS — Z09 Encounter for follow-up examination after completed treatment for conditions other than malignant neoplasm: Secondary | ICD-10-CM | POA: Diagnosis not present

## 2016-07-11 DIAGNOSIS — I10 Essential (primary) hypertension: Secondary | ICD-10-CM | POA: Diagnosis not present

## 2016-07-11 NOTE — Telephone Encounter (Signed)
Patient called you back in regards to her labs.  Cb#[phone].  Thank you.

## 2016-07-11 NOTE — Telephone Encounter (Signed)
Patient advised labs are stable

## 2016-07-20 ENCOUNTER — Other Ambulatory Visit: Payer: Self-pay | Admitting: Rheumatology

## 2016-07-20 NOTE — Telephone Encounter (Signed)
ok 

## 2016-07-20 NOTE — Telephone Encounter (Addendum)
Last Visit: 06/01/16 Next Visit: 08/31/16 Labs: 07/07/16 Stable Left message for patient to call the office. Need PLQ eye exam. Patient returned call. Patient states she is schedule for 07/28/16.   Okay to refill 30 supply of PLQ?

## 2016-07-24 NOTE — Progress Notes (Deleted)
   Procedure Note  Patient: Deborah Farley             Date of Birth: [birth year]           MRN: [id number]             Visit Date: 07/26/2016  Procedures: Visit Diagnoses: Pain hands  Patient with known history of rheumatoid arthritis. She is on methotrexate and Plaquenil. She came to get ultrasound examination of bilateral hands to look for any underlying synovitis. No procedures performed

## 2016-07-26 ENCOUNTER — Ambulatory Visit: Payer: Medicare Other | Admitting: Rheumatology

## 2016-07-28 DIAGNOSIS — H40013 Open angle with borderline findings, low risk, bilateral: Secondary | ICD-10-CM | POA: Diagnosis not present

## 2016-08-01 DIAGNOSIS — K449 Diaphragmatic hernia without obstruction or gangrene: Secondary | ICD-10-CM | POA: Diagnosis not present

## 2016-08-01 DIAGNOSIS — R221 Localized swelling, mass and lump, neck: Secondary | ICD-10-CM | POA: Diagnosis not present

## 2016-08-01 DIAGNOSIS — R0789 Other chest pain: Secondary | ICD-10-CM | POA: Diagnosis not present

## 2016-08-08 DIAGNOSIS — I10 Essential (primary) hypertension: Secondary | ICD-10-CM | POA: Diagnosis not present

## 2016-08-08 DIAGNOSIS — E119 Type 2 diabetes mellitus without complications: Secondary | ICD-10-CM | POA: Diagnosis not present

## 2016-08-08 DIAGNOSIS — E039 Hypothyroidism, unspecified: Secondary | ICD-10-CM | POA: Diagnosis not present

## 2016-08-08 DIAGNOSIS — Z79899 Other long term (current) drug therapy: Secondary | ICD-10-CM | POA: Diagnosis not present

## 2016-08-08 DIAGNOSIS — E559 Vitamin D deficiency, unspecified: Secondary | ICD-10-CM | POA: Diagnosis not present

## 2016-08-10 DIAGNOSIS — E785 Hyperlipidemia, unspecified: Secondary | ICD-10-CM | POA: Diagnosis not present

## 2016-08-10 DIAGNOSIS — Q245 Malformation of coronary vessels: Secondary | ICD-10-CM | POA: Diagnosis not present

## 2016-08-10 DIAGNOSIS — R079 Chest pain, unspecified: Secondary | ICD-10-CM | POA: Diagnosis not present

## 2016-08-31 ENCOUNTER — Ambulatory Visit: Payer: Medicare Other | Admitting: Rheumatology

## 2016-09-03 NOTE — Progress Notes (Deleted)
06/01/16 Assessment / Plan:     Visit Diagnoses: Rheumatoid arthritis of multiple sites with negative rheumatoid factor (HCC)I do not have previous serology on the patient. We will check her rheumatoid factor and CCP with her next labs. She states she has intermittent swelling in her wrist joint and also ankle joints. She had no synovitis on examination today. As she is having intermittent flares I will schedule ultrasound of her bilateral hands do well with this further. I will also add Plaquenil to control her arthritis better. Indications side effects contraindications were discussed at length. Handout was given consent was taken. The plan is to start her on Plaquenil 200 mg by mouth daily. I will check her labs in 6 weeks and then every 3 months as a scheduled.  High risk medication use - Methotrexate 4/week and folic acid 1 mg by mouth daily.  Patient complains of some intermittent costochondritis. She had mild tenderness over the sternum

## 2016-09-06 ENCOUNTER — Ambulatory Visit: Payer: Medicare Other | Admitting: Rheumatology

## 2016-09-12 ENCOUNTER — Ambulatory Visit: Payer: Medicare Other | Admitting: Rheumatology

## 2016-09-14 ENCOUNTER — Other Ambulatory Visit: Payer: Self-pay | Admitting: Rheumatology

## 2016-09-14 ENCOUNTER — Other Ambulatory Visit: Payer: Self-pay | Admitting: *Deleted

## 2016-09-14 ENCOUNTER — Telehealth: Payer: Self-pay | Admitting: Rheumatology

## 2016-09-14 MED ORDER — METHOTREXATE 2.5 MG PO TABS: 10.0000 mg | ORAL_TABLET | ORAL | 0 refills | Status: DC

## 2016-09-14 MED ORDER — HYDROXYCHLOROQUINE SULFATE 200 MG PO TABS: 200.0000 mg | ORAL_TABLET | Freq: Every day | ORAL | 0 refills | Status: DC

## 2016-09-14 NOTE — Telephone Encounter (Signed)
Patient is requesting for you to prescribe Trazodone. Patient was originally given this by a Dr. In [place]   Last Visit: 05/09/16 Next Visit  Was due April 2018. Message sent to front to schedule patient.  Labs: 07/07/16 stable PLQ Eye Exam" 08/02/16 WNL  Okay to refill PLQ, MTX? Will you take over Trazadone?

## 2016-09-14 NOTE — Telephone Encounter (Signed)
Okay to refill Plaquenil and methotrexate. Trazodone she be prescribed by her PCP

## 2016-09-14 NOTE — Telephone Encounter (Signed)
Patient advised Trazodone should be prescribed by her PCP. Patient verbalized understanding.

## 2016-09-14 NOTE — Telephone Encounter (Signed)
Patient calling to request a rf on her Hydrochlorquine 200mg , and MTX , and Trazidone 50mg  (new rx for Dr. Dr. In prescibed [place]). Patient uses [facility]. Please call patient to advise if called in.

## 2016-09-14 NOTE — Telephone Encounter (Signed)
Patient returning Deborah Farley's call to discuss RX from GP.

## 2016-09-15 ENCOUNTER — Other Ambulatory Visit: Payer: Self-pay | Admitting: *Deleted

## 2016-09-15 MED ORDER — TRAZODONE HCL 50 MG PO TABS: 50.0000 mg | ORAL_TABLET | Freq: Every day | ORAL | 2 refills | Status: DC

## 2016-09-15 NOTE — Telephone Encounter (Signed)
Trazadone 50 mg po qhs #30 rx2

## 2016-09-15 NOTE — Telephone Encounter (Signed)
Patient states she spoke with her primary care physician and advised that the they would not prescribe the Trazodone because the prescription was originally prescribed by her previous rheumatologist. Her PCP feels that this prescriptions should continue to be prescribe by the rheumatologist. Patient is upset because she is unable to get the prescription filled.

## 2016-09-15 NOTE — Telephone Encounter (Signed)
Patient advised prescription to be sent to the pharmacy.  

## 2016-09-15 NOTE — Addendum Note (Signed)
Addended by: Henriette Combs on: 09/15/2016 03:49 PM   Modules accepted: Orders

## 2016-09-20 ENCOUNTER — Telehealth: Payer: Self-pay | Admitting: Rheumatology

## 2016-09-20 MED ORDER — METHOTREXATE 2.5 MG PO TABS: 10.0000 mg | ORAL_TABLET | ORAL | 0 refills | Status: DC

## 2016-09-20 MED ORDER — HYDROXYCHLOROQUINE SULFATE 200 MG PO TABS: 200.0000 mg | ORAL_TABLET | Freq: Every day | ORAL | 0 refills | Status: DC

## 2016-09-20 NOTE — Telephone Encounter (Signed)
Patient calling in reference to rxs that were sent in. Meds went to [facility], but they need to go to [facility]. Meds are Plaquenil, MTX, Trazodone. Please call patient when meds sent in.

## 2016-09-20 NOTE — Telephone Encounter (Signed)
Left message to advise patient prescriptions have been sent to [facility].

## 2016-10-19 DIAGNOSIS — I10 Essential (primary) hypertension: Secondary | ICD-10-CM | POA: Diagnosis not present

## 2016-10-19 DIAGNOSIS — Q245 Malformation of coronary vessels: Secondary | ICD-10-CM | POA: Diagnosis not present

## 2016-10-19 DIAGNOSIS — R0602 Shortness of breath: Secondary | ICD-10-CM | POA: Diagnosis not present

## 2016-10-19 DIAGNOSIS — E785 Hyperlipidemia, unspecified: Secondary | ICD-10-CM | POA: Diagnosis not present

## 2016-10-19 DIAGNOSIS — R609 Edema, unspecified: Secondary | ICD-10-CM | POA: Diagnosis not present

## 2016-10-19 DIAGNOSIS — K219 Gastro-esophageal reflux disease without esophagitis: Secondary | ICD-10-CM | POA: Diagnosis not present

## 2016-10-19 DIAGNOSIS — Z79899 Other long term (current) drug therapy: Secondary | ICD-10-CM | POA: Diagnosis not present

## 2016-10-19 DIAGNOSIS — R079 Chest pain, unspecified: Secondary | ICD-10-CM | POA: Diagnosis not present

## 2016-10-19 DIAGNOSIS — Z8249 Family history of ischemic heart disease and other diseases of the circulatory system: Secondary | ICD-10-CM | POA: Diagnosis not present

## 2016-11-08 ENCOUNTER — Other Ambulatory Visit: Payer: Self-pay | Admitting: Rheumatology

## 2016-11-09 NOTE — Telephone Encounter (Signed)
Last Visit: 05/09/16 Next Visit  Was due April 2018. Message sent to front to schedule patient.  Labs: 07/07/16 stable PLQ Eye Exam: 08/02/16 WNL  Okay to refill PLQ?

## 2016-11-14 NOTE — Progress Notes (Signed)
Office Visit Note  Patient: Deborah Farley             Date of Birth: [birth year]           MRN: [id number]             PCP: Lucianne Lei, MD Referring: Lucianne Lei, MD Visit Date: 11/16/2016 Occupation: @GUAROCC @    Subjective:  Medication Management (patient has some dizziness ? from PLQ wants to know if it would help to take in evening )   History of Present Illness: Deborah Farley is a 71 y.o. female with history of seronegative rheumatoid arthritis. She was started on Plaquenil in January. She's noticed some improvement in her joint symptoms. She states in the last week she has had somewhat increased pain. She's also experiencing dizziness and is concerned if it's related to Plaquenil. She slipped on her staircase at home while coming here. She states she landed on her bottom. She has a laceration on her right toe and her right ankle has been bothering her.  Activities of Daily Living:  Patient reports morning stiffness for 1 hour.   Patient Denies nocturnal pain.  Difficulty dressing/grooming: Denies Difficulty climbing stairs: Denies Difficulty getting out of chair: Denies Difficulty using hands for taps, buttons, cutlery, and/or writing: Denies   Review of Systems  Constitutional: Negative for fatigue, night sweats, weight gain, weight loss and weakness.  HENT: Negative for mouth sores, trouble swallowing, trouble swallowing, mouth dryness and nose dryness.   Eyes: Negative for pain, redness, visual disturbance and dryness.  Respiratory: Negative for cough, shortness of breath and difficulty breathing.   Cardiovascular: Negative for chest pain, palpitations, hypertension, irregular heartbeat and swelling in legs/feet.  Gastrointestinal: Negative for blood in stool, constipation and diarrhea.  Endocrine: Negative for increased urination.  Genitourinary: Negative for vaginal dryness.  Musculoskeletal: Positive for arthralgias, joint pain and morning stiffness. Negative for  joint swelling, myalgias, muscle weakness, muscle tenderness and myalgias.  Skin: Negative for color change, rash, hair loss, skin tightness, ulcers and sensitivity to sunlight.  Allergic/Immunologic: Negative for susceptible to infections.  Neurological: Positive for dizziness. Negative for memory loss and night sweats.  Hematological: Negative for swollen glands.  Psychiatric/Behavioral: Positive for depressed mood. Negative for sleep disturbance. The patient is nervous/anxious.     PMFS History:  Patient Active Problem List   Diagnosis Date Noted  . History of diabetes mellitus 05/31/2016  . History of hypothyroidism 05/31/2016  . History of gastroesophageal reflux (GERD) 05/31/2016  . Rheumatoid arthritis (HCC) 04/12/2016  . High risk medication use 04/12/2016  . Diabetes (HCC) 03/01/2016  . Hyperlipidemia 02/29/2016  . Hypertension 02/29/2016  . Hypothyroidism 02/29/2016  . Depression 02/29/2016  . Obesity (BMI 35.0-39.9 without comorbidity) 02/29/2016  . Acid reflux 02/29/2016  . Angina pectoris (HCC) 08/11/2015    Past Medical History:  Diagnosis Date  . Depression   . Diabetes mellitus without complication (HCC)   . Heart disease   . Hernia, hiatal   . High cholesterol   . Hypertension   . Hypothyroidism   . Memory difficulty   . Spastic esophagus     No family history on file. Past Surgical History:  Procedure Laterality Date  . ABDOMINAL HYSTERECTOMY    . BACK SURGERY    . CARDIAC CATHETERIZATION N/A 08/12/2015   Procedure: Left Heart Cath and Coronary Angiography;  Surgeon: 10/12/2015, MD;  Location: [facility] INVASIVE CV LAB;  Service: Cardiovascular;  Laterality: N/A;  . TOTAL ABDOMINAL HYSTERECTOMY W/ BILATERAL  SALPINGOOPHORECTOMY     Social History   Social History Narrative  . No narrative on file     Objective: Vital Signs: BP 130/78   Pulse 84   Resp 16    Physical Exam  Constitutional: She is oriented to person, place, and time. She appears  well-developed and well-nourished.  HENT:  Head: Normocephalic and atraumatic.  Eyes: Conjunctivae and EOM are normal.  Neck: Normal range of motion.  Cardiovascular: Normal rate, regular rhythm, normal heart sounds and intact distal pulses.   Pulmonary/Chest: Effort normal and breath sounds normal.  Abdominal: Soft. Bowel sounds are normal.  Lymphadenopathy:    She has no cervical adenopathy.  Neurological: She is alert and oriented to person, place, and time.  Skin: Skin is warm and dry. Capillary refill takes less than 2 seconds.  Laceration on the right toe noted  Psychiatric: She has a normal mood and affect. Her behavior is normal.  Nursing note and vitals reviewed.    Musculoskeletal Exam: C-spine good range of motion. Shoulder joints elbow joints wrist joint MCPs PIPs DIPs with good range of motion with no synovitis. Knee joints are good range of motion with no swelling warmth. She is tenderness on palpation of her right ankle joint for the recent injury.  CDAI Exam: CDAI Homunculus Exam:   Joint Counts:  CDAI Tender Joint count: 0 CDAI Swollen Joint count: 0  Global Assessments:  Patient Global Assessment: 2 Provider Global Assessment: 2  CDAI Calculated Score: 4    Investigation: Findings:  07/28/2016 normal Plaquenil eye exam   CBC Latest Ref Rng & Units 07/07/2016 05/29/2016 03/01/2016  WBC 3.4 - 10.8 x10E3/uL 7.4 8.5 7.7  Hemoglobin 11.1 - 15.9 g/dL 82.9 93.7 15.8(H)  Hematocrit 34.0 - 46.6 % 44.2 46.1 46.7(H)  Platelets 150 - 379 x10E3/uL 285 261 328   CMP Latest Ref Rng & Units 07/07/2016 05/29/2016 03/01/2016  Glucose 65 - 99 mg/dL 169(C) 789(F) 810(F)  BUN 8 - 27 mg/dL 11 11 14   Creatinine 0.57 - 1.00 mg/dL 7.51(W) 2.58(N) 2.77(O)  Sodium 134 - 144 mmol/L 144 140 140  Potassium 3.5 - 5.2 mmol/L 4.6 4.1 4.5  Chloride 96 - 106 mmol/L 105 101 104  CO2 18 - 29 mmol/L 22 20 24   Calcium 8.7 - 10.3 mg/dL 9.7 9.7 9.5  Total Protein 6.0 - 8.5 g/dL 6.9 6.8 7.3    Total Bilirubin 0.0 - 1.2 mg/dL 0.4 0.4 0.5  Alkaline Phos 39 - 117 IU/L 105 110 112  AST 0 - 40 IU/L 27 19 28   ALT 0 - 32 IU/L 24 18 22     Imaging: No results found.  Speciality Comments: No specialty comments available.    Procedures:  No procedures performed Allergies: Codeine and Iodine   Assessment / Plan:     Visit Diagnoses: Rheumatoid arthritis of multiple sites with negative rheumatoid factor (HCC): She is clinically doing well without any synovitis on examination. Although she's concerned that her dizziness could be coming from Plaquenil. I advised to come off Plaquenil to see if her dizziness improves. She was to try aching Plaquenil at bedtime and see if her symptoms improve. If not then she will have further evaluation by her PCP and cardiologist.  High risk medication use - Plaquenil 200 mg po qd, methotrexate 4 tablets by mouth every week, folic acid 1 mg by mouth daily - Plan: CBC with Differential/Platelet, CBC with Differential/Platelet, COMPLETE METABOLIC PANEL WITH GFR, CANCELED: COMPLETE METABOLIC PANEL WITH GFR. We  will check labs today and then every 3 months to monitor for drug toxicity.  She had a fall today. She's having right ankle joint pain and swelling. I've advised her to go to urgent care. She also has a laceration on her right toe which can be evaluated at the urgent care and treated accordingly.  Chronic insomnia: She requested refill on trazodone .she is not having any side effects from that. Prescription refill was given today. Side effects were discussed.  History of diabetes mellitus, hyperlipidemia, hypertension, gastroesophageal reflux, angina pectoris.  History of gastroesophageal reflux (GERD)  History of hypothyroidism  History of depression   Orders: No orders of the defined types were placed in this encounter.  Meds ordered this encounter  Medications  . traZODone (DESYREL) 50 MG tablet    Sig: Take 1 tablet (50 mg total) by  mouth at bedtime.    Dispense:  90 tablet    Refill:  1    Face-to-face time spent with patient was 30 minutes. 50% of time was spent in counseling and coordination of care.  Follow-Up Instructions: Return in about 4 months (around 03/19/2017) for Rheumatoid arthritis.   Pollyann Savoy, MD  Note - This record has been created using Animal nutritionist.  Chart creation errors have been sought, but may not always  have been located. Such creation errors do not reflect on  the standard of medical care.

## 2016-11-16 ENCOUNTER — Ambulatory Visit (INDEPENDENT_AMBULATORY_CARE_PROVIDER_SITE_OTHER): Payer: Medicare Other

## 2016-11-16 ENCOUNTER — Encounter: Payer: Self-pay | Admitting: Rheumatology

## 2016-11-16 ENCOUNTER — Encounter (HOSPITAL_COMMUNITY): Payer: Self-pay | Admitting: Family Medicine

## 2016-11-16 ENCOUNTER — Ambulatory Visit (HOSPITAL_COMMUNITY)
Admission: EM | Admit: 2016-11-16 | Discharge: 2016-11-16 | Disposition: A | Discharge status: Home / Self Care | Payer: Medicare Other | Attending: Emergency Medicine | Admitting: Emergency Medicine

## 2016-11-16 ENCOUNTER — Ambulatory Visit (INDEPENDENT_AMBULATORY_CARE_PROVIDER_SITE_OTHER): Payer: Medicare Other | Admitting: Rheumatology

## 2016-11-16 VITALS — BP 130/78 | HR 84 | Resp 16

## 2016-11-16 DIAGNOSIS — S99911A Unspecified injury of right ankle, initial encounter: Secondary | ICD-10-CM | POA: Diagnosis not present

## 2016-11-16 DIAGNOSIS — M0609 Rheumatoid arthritis without rheumatoid factor, multiple sites: Secondary | ICD-10-CM | POA: Diagnosis not present

## 2016-11-16 DIAGNOSIS — W108XXA Fall (on) (from) other stairs and steps, initial encounter: Secondary | ICD-10-CM

## 2016-11-16 DIAGNOSIS — Z79899 Other long term (current) drug therapy: Secondary | ICD-10-CM

## 2016-11-16 DIAGNOSIS — S82424A Nondisplaced transverse fracture of shaft of right fibula, initial encounter for closed fracture: Secondary | ICD-10-CM | POA: Diagnosis not present

## 2016-11-16 DIAGNOSIS — S91111A Laceration without foreign body of right great toe without damage to nail, initial encounter: Secondary | ICD-10-CM | POA: Diagnosis not present

## 2016-11-16 DIAGNOSIS — M25571 Pain in right ankle and joints of right foot: Secondary | ICD-10-CM

## 2016-11-16 DIAGNOSIS — Z8719 Personal history of other diseases of the digestive system: Secondary | ICD-10-CM | POA: Diagnosis not present

## 2016-11-16 DIAGNOSIS — Z8639 Personal history of other endocrine, nutritional and metabolic disease: Secondary | ICD-10-CM | POA: Diagnosis not present

## 2016-11-16 DIAGNOSIS — Z8659 Personal history of other mental and behavioral disorders: Secondary | ICD-10-CM

## 2016-11-16 DIAGNOSIS — S82831A Other fracture of upper and lower end of right fibula, initial encounter for closed fracture: Secondary | ICD-10-CM

## 2016-11-16 DIAGNOSIS — S91311A Laceration without foreign body, right foot, initial encounter: Secondary | ICD-10-CM

## 2016-11-16 DIAGNOSIS — F5101 Primary insomnia: Secondary | ICD-10-CM | POA: Diagnosis not present

## 2016-11-16 DIAGNOSIS — M7989 Other specified soft tissue disorders: Secondary | ICD-10-CM | POA: Diagnosis not present

## 2016-11-16 LAB — CBC WITH DIFFERENTIAL/PLATELET
BASOS ABS: 101 {cells}/uL (ref 0–200)
BASOS PCT: 1 %
EOS PCT: 1 %
Eosinophils Absolute: 101 cells/uL (ref 15–500)
HCT: 47 % — ABNORMAL HIGH (ref 35.0–45.0)
Hemoglobin: 16 g/dL — ABNORMAL HIGH (ref 11.7–15.5)
LYMPHS ABS: 2323 {cells}/uL (ref 850–3900)
Lymphocytes Relative: 23 %
MCH: 33 pg (ref 27.0–33.0)
MCHC: 34 g/dL (ref 32.0–36.0)
MCV: 96.9 fL (ref 80.0–100.0)
MPV: 11 fL (ref 7.5–12.5)
Monocytes Absolute: 1010 cells/uL — ABNORMAL HIGH (ref 200–950)
Monocytes Relative: 10 %
NEUTROS ABS: 6565 {cells}/uL (ref 1500–7800)
Neutrophils Relative %: 65 %
Platelets: 286 10*3/uL (ref 140–400)
RBC: 4.85 MIL/uL (ref 3.80–5.10)
RDW: 14.7 % (ref 11.0–15.0)
WBC: 10.1 10*3/uL (ref 3.8–10.8)

## 2016-11-16 LAB — COMPLETE METABOLIC PANEL WITH GFR
ALBUMIN: 4.2 g/dL (ref 3.6–5.1)
ALK PHOS: 116 U/L (ref 33–130)
ALT: 22 U/L (ref 6–29)
AST: 24 U/L (ref 10–35)
BILIRUBIN TOTAL: 0.4 mg/dL (ref 0.2–1.2)
BUN: 16 mg/dL (ref 7–25)
CO2: 22 mmol/L (ref 20–31)
CREATININE: 1.14 mg/dL — AB (ref 0.60–0.93)
Calcium: 9.6 mg/dL (ref 8.6–10.4)
Chloride: 104 mmol/L (ref 98–110)
GFR, Est African American: 56 mL/min — ABNORMAL LOW (ref >=60)
GFR, Est Non African American: 48 mL/min — ABNORMAL LOW (ref >=60)
Glucose, Bld: 88 mg/dL (ref 65–99)
Potassium: 4.7 mmol/L (ref 3.5–5.3)
Sodium: 137 mmol/L (ref 135–146)
TOTAL PROTEIN: 7.1 g/dL (ref 6.1–8.1)

## 2016-11-16 MED ORDER — ACETAMINOPHEN 325 MG PO TABS
ORAL_TABLET | ORAL | Status: AC
  Filled 2016-11-16: qty 3

## 2016-11-16 MED ORDER — IBUPROFEN 800 MG PO TABS
800.0000 mg | ORAL_TABLET | Freq: Once | ORAL | Status: AC
  Administered 2016-11-16: 800 mg via ORAL

## 2016-11-16 MED ORDER — ACETAMINOPHEN 325 MG PO TABS
975.0000 mg | ORAL_TABLET | Freq: Once | ORAL | Status: AC
Start: 2016-11-16 — End: 2016-11-16
  Administered 2016-11-16: 975 mg via ORAL

## 2016-11-16 MED ORDER — IBUPROFEN 800 MG PO TABS
ORAL_TABLET | ORAL | Status: AC
  Filled 2016-11-16: qty 1

## 2016-11-16 MED ORDER — TRAZODONE HCL 50 MG PO TABS: 50.0000 mg | ORAL_TABLET | Freq: Every day | ORAL | 1 refills | Status: DC

## 2016-11-16 MED ORDER — TRAMADOL HCL 50 MG PO TABS: ORAL_TABLET | ORAL | 0 refills | Status: DC

## 2016-11-16 NOTE — ED Provider Notes (Signed)
CSN: [id number]     Arrival date & time 11/16/16  1424 History   None    Chief Complaint  Patient presents with  . Ankle Pain   (Consider location/radiation/quality/duration/timing/severity/associated sxs/prior Treatment) Patient fell down some stairs today and she has right lateral ankle pain and a laceration on her right first lateral toe.  She is UTD with tetanus.   The history is provided by the patient.  Ankle Pain  Location:  Ankle Time since incident:  1 day Injury: yes   Mechanism of injury: fall   Fall:    Fall occurred:  Down stairs   Impact surface:  Water quality scientist of impact:  Unable to specify Ankle location:  R ankle Pain details:    Quality:  Aching   Radiates to:  Does not radiate   Severity:  Moderate   Onset quality:  Sudden   Duration:  1 day   Timing:  Constant Chronicity:  New Dislocation: no   Foreign body present:  No foreign bodies Tetanus status:  Up to date Prior injury to area:  No Relieved by:  Nothing Worsened by:  Nothing Ineffective treatments:  None tried   Past Medical History:  Diagnosis Date  . Depression   . Diabetes mellitus without complication (HCC)   . Heart disease   . Hernia, hiatal   . High cholesterol   . Hypertension   . Hypothyroidism   . Memory difficulty   . Spastic esophagus    Past Surgical History:  Procedure Laterality Date  . ABDOMINAL HYSTERECTOMY    . BACK SURGERY    . CARDIAC CATHETERIZATION N/A 08/12/2015   Procedure: Left Heart Cath and Coronary Angiography;  Surgeon: Yates Decamp, MD;  Location: [facility] INVASIVE CV LAB;  Service: Cardiovascular;  Laterality: N/A;  . TOTAL ABDOMINAL HYSTERECTOMY W/ BILATERAL SALPINGOOPHORECTOMY     History reviewed. No pertinent family history. Social History  Substance Use Topics  . Smoking status: Never Smoker  . Smokeless tobacco: Never Used  . Alcohol use No   OB History    No data available     Review of Systems  Constitutional: Negative.   HENT: Negative.    Eyes: Negative.   Respiratory: Negative.   Cardiovascular: Negative.   Gastrointestinal: Negative.   Endocrine: Negative.   Genitourinary: Negative.   Musculoskeletal: Positive for arthralgias.  Skin: Positive for wound.  Allergic/Immunologic: Negative.   Neurological: Negative.   Hematological: Negative.   Psychiatric/Behavioral: Negative.     Allergies  Codeine and Iodine  Home Medications   Prior to Admission medications   Medication Sig Start Date End Date Taking? Authorizing Provider  aspirin EC 81 MG tablet Take 81 mg by mouth daily.    [provider]  bumetanide (BUMEX) 1 MG tablet Take 1 mg by mouth daily. prn    [provider]  buPROPion (WELLBUTRIN XL) 300 MG 24 hr tablet  09/15/16   [provider]  cyanocobalamin 2000 MCG tablet Take 2,000 mcg by mouth daily.    [provider]  diltiazem (CARDIZEM CD) 180 MG 24 hr capsule Take 1 capsule (180 mg total) by mouth daily. 08/12/15   Yates Decamp, MD  donepezil (ARICEPT) 10 MG tablet Take 10 mg by mouth at bedtime.    [provider]  enalapril (VASOTEC) 20 MG tablet Take 20 mg by mouth daily.    [provider]  folic acid (FOLVITE) 400 MCG tablet Take 400 mcg by mouth daily.  [provider]  hydroxychloroquine (PLAQUENIL) 200 MG tablet TAKE 1 TABLET BY MOUTH  DAILY 11/09/16   Pollyann Savoy, MD  levothyroxine (SYNTHROID, LEVOTHROID) 25 MCG tablet Take 25 mcg by mouth daily before breakfast.    [provider]  methotrexate (RHEUMATREX) 2.5 MG tablet Take 4 tablets (10 mg total) by mouth once a week. Caution:Chemotherapy. Protect from light. 09/20/16   Pollyann Savoy, MD  metoprolol tartrate (LOPRESSOR) 25 MG tablet Take 25 mg by mouth 2 (two) times daily.    [provider]  Multiple Vitamin (MULTIVITAMIN) capsule Take 1 capsule by mouth daily.    [provider]  omeprazole (PRILOSEC) 20 MG capsule Take 20 mg by mouth 2 (two)  times daily before a meal.    [provider]  polyethylene glycol (MIRALAX / GLYCOLAX) packet Take 17 g by mouth daily as needed for mild constipation.     [provider]  pravastatin (PRAVACHOL) 10 MG tablet Take 10 mg by mouth daily.    [provider]  sertraline (ZOLOFT) 50 MG tablet Take 50 mg by mouth daily.    [provider]  traMADol (ULTRAM) 50 MG tablet Take 50 mg by mouth every 6 (six) hours as needed for moderate pain.     [provider]  traMADol Janean Sark) 50 MG tablet Take one to two po q 6 hours prn pain 11/16/16   Deatra Canter, FNP  traZODone (DESYREL) 50 MG tablet Take 1 tablet (50 mg total) by mouth at bedtime. 11/16/16   Pollyann Savoy, MD   Meds Ordered and Administered this Visit   Medications  acetaminophen (TYLENOL) tablet 975 mg (975 mg Oral Given 11/16/16 1621)  ibuprofen (ADVIL,MOTRIN) tablet 800 mg (800 mg Oral Given 11/16/16 1620)    BP 115/77   Pulse (!) 53   Temp 98.5 F (36.9 C)   Resp 18   SpO2 100%  No data found.   Physical Exam  Constitutional: She appears well-developed and well-nourished.  HENT:  Head: Normocephalic and atraumatic.  Eyes: Pupils are equal, round, and reactive to light. Conjunctivae and EOM are normal.  Neck: Normal range of motion. Neck supple.  Cardiovascular: Normal rate, regular rhythm and normal heart sounds.   Pulmonary/Chest: Effort normal and breath sounds normal.  Musculoskeletal: She exhibits tenderness.  TTP right lateral malleolus.  Skin:  Right lateral first toe with laceration approx 7 cm  Nursing note and vitals reviewed.   Urgent Care Course     .Marland KitchenLaceration Repair Date/Time: 11/16/2016 4:36 PM Performed by: Deatra Canter Authorized by: Domenick Gong   Consent:    Consent obtained:  Verbal   Consent given by:  Patient   Risks discussed:  Infection and pain   Alternatives discussed:  No treatment Anesthesia (see MAR for exact dosages):     Anesthesia method:  Local infiltration   Local anesthetic:  Lidocaine 1% w/o epi Laceration details:    Length (cm):  7   Depth (mm):  3 Repair type:    Repair type:  Simple Pre-procedure details:    Preparation:  Patient was prepped and draped in usual sterile fashion Exploration:    Wound extent: areolar tissue violated     Contaminated: no   Treatment:    Area cleansed with:  Betadine and saline   Amount of cleaning:  Standard   Irrigation solution:  Sterile saline   Irrigation volume:  120 ml   Irrigation method:  Pressure wash   Visualized foreign bodies/material  removed: no   Skin repair:    Repair method:  Sutures   Suture size:  3-0   Suture material:  Nylon   Number of sutures:  5 Approximation:    Approximation:  Close Post-procedure details:    Dressing:  Antibiotic ointment, non-adherent dressing and adhesive bandage   Patient tolerance of procedure:  Tolerated well, no immediate complications   (including critical care time)  Labs Review Labs Reviewed - No data to display  Imaging Review Dg Ankle Complete Right  Addendum Date: 11/16/2016   ADDENDUM REPORT: 11/16/2016 15:31 ADDENDUM: Further review demonstrates a nondisplaced oblique distal right fibula fracture, better visualized on the subsequent right foot radiograph views. The original report is otherwise unchanged. These results were called by telephone at the time of interpretation on 11/16/2016 at 3:30 pm to NP Nils Pyle , who verbally acknowledged these results. Electronically Signed   By: Delbert Phenix M.D.   On: 11/16/2016 15:31   Result Date: 11/16/2016 CLINICAL DATA:  Right ankle injury from fall down stairs EXAM: RIGHT ANKLE - COMPLETE 3+ VIEW COMPARISON:  None. FINDINGS: Moderate lateral right ankle soft tissue swelling. No fracture or subluxation. No suspicious focal osseous lesion. Small plantar right calcaneal spur. Small ossicle adjacent to the medial malleolus. No radiopaque foreign body.  IMPRESSION: Moderate lateral right ankle soft tissue swelling, with no fracture or subluxation. Electronically Signed: By: Delbert Phenix M.D. On: 11/16/2016 15:20   Dg Foot Complete Right  Result Date: 11/16/2016 CLINICAL DATA:  Right foot injury after fall earlier today. EXAM: RIGHT FOOT COMPLETE - 3+ VIEW COMPARISON:  None. FINDINGS: Moderate lateral right ankle soft tissue swelling. Nondisplaced oblique distal right fibula fracture. No additional fracture in the right foot. No dislocation. Moderate osteoarthritis at the first metatarsal-phalangeal joint. Small plantar and tiny Achilles right calcaneal spurs. No radiopaque foreign body. IMPRESSION: 1. Nondisplaced oblique distal right fibula fracture. 2. No additional fracture in the right foot.  No malalignment. Electronically Signed   By: Delbert Phenix M.D.   On: 11/16/2016 15:27     Visual Acuity Review  Right Eye Distance:   Left Eye Distance:   Bilateral Distance:    Right Eye Near:   Left Eye Near:    Bilateral Near:         MDM   1. Closed traumatic nondisplaced fracture of distal end of right fibula, initial encounter   2. Laceration of right great toe without damage to nail, foreign body presence unspecified, initial encounter    1 vertical mattress suture and 4 simple sutures with 3.0 nylon.  Cam walker Crutches  Referral to Dr. Roda [facility] call for an appointment in a week.      Deatra Canter, FNP 11/16/16 1640

## 2016-11-16 NOTE — Patient Instructions (Addendum)
Standing Labs We placed an order today for your standing lab work.    Please come back and get your standing labs in October and every 3 months  We have open lab Monday through Friday from 8:30-11:30 AM and 1:30-4 PM at the office of Dr. Masey Scheiber.   The office is located at [address], [address], [place], [place] [address] No appointment is necessary.   Labs are drawn by [facility].  You may receive a bill from [facility] for your lab work. If you have any questions regarding directions or hours of operation,  please call [phone].    

## 2016-11-16 NOTE — ED Triage Notes (Signed)
Pt here for right foot pain, ankle pain and laceration. sts that she fell on some stairs.

## 2016-11-16 NOTE — Progress Notes (Signed)
Rheumatology Medication Review by a Pharmacist Does the patient feel that his/her medications are working for him/her?  Yes Has the patient been experiencing any side effects to the medications prescribed?  Yes, patient reports some dizziness.  She is not sure if it is related to hydroxychloroquine Does the patient have any problems obtaining medications?  No  Issues to address at subsequent visits: None   Pharmacist comments:  Deborah Farley is a pleasant 71 yo F who presents for follow up of rheumatoid arthritis.  She is currently taking methotrexate 4 tablets weekly, folic acid 400 mcg daily, and hydroxychloroquine 200 mg daily.  I advised patient she should take at least 1 mg of folic acid daily.  Most recent standing labs were on 07/07/16.  Patient is due for standing labs today.  Most recent hydroxychloroquine eye exam was on 07/28/16 which was normal.  We discussed that dizziness may or may not be associated with hydroxychloroquine.  Patient cannot remember if dizziness started before or after she started hydroxychloroquine in January 2018.  Discussed trial off hydroxychloroquine but patient did not want to stop the medication.  She is going to try taking the medication in the evening instead of morning to see if that helps.  Dr. Corliss Skains also advised patient to follow up with her primary care provider regarding dizziness.  Patient denies any further questions or concerns regarding her medications at this time.   Lilla Shook, Pharm.D., BCPS, CPP Clinical Pharmacist Pager: [phone] Phone: [phone] 11/16/2016 2:01 PM

## 2016-11-17 NOTE — Progress Notes (Signed)
Mild elevation of creatinine. These for labs to her PCP

## 2016-11-20 DIAGNOSIS — M25571 Pain in right ankle and joints of right foot: Secondary | ICD-10-CM | POA: Diagnosis not present

## 2016-11-27 DIAGNOSIS — M25571 Pain in right ankle and joints of right foot: Secondary | ICD-10-CM | POA: Diagnosis not present

## 2016-11-28 ENCOUNTER — Telehealth: Payer: Self-pay | Admitting: Rheumatology

## 2016-11-28 NOTE — Telephone Encounter (Signed)
Patient left a message, returning Andrea's call.

## 2016-11-29 NOTE — Telephone Encounter (Signed)
Patient advised of lab results and verbalized understanding.  

## 2016-12-07 DIAGNOSIS — E559 Vitamin D deficiency, unspecified: Secondary | ICD-10-CM | POA: Diagnosis not present

## 2016-12-07 DIAGNOSIS — Z79899 Other long term (current) drug therapy: Secondary | ICD-10-CM | POA: Diagnosis not present

## 2016-12-07 DIAGNOSIS — I10 Essential (primary) hypertension: Secondary | ICD-10-CM | POA: Diagnosis not present

## 2016-12-07 DIAGNOSIS — E039 Hypothyroidism, unspecified: Secondary | ICD-10-CM | POA: Diagnosis not present

## 2016-12-07 DIAGNOSIS — E119 Type 2 diabetes mellitus without complications: Secondary | ICD-10-CM | POA: Diagnosis not present

## 2016-12-07 DIAGNOSIS — Z139 Encounter for screening, unspecified: Secondary | ICD-10-CM | POA: Diagnosis not present

## 2016-12-13 DIAGNOSIS — M25571 Pain in right ankle and joints of right foot: Secondary | ICD-10-CM | POA: Diagnosis not present

## 2016-12-28 DIAGNOSIS — S8291XA Unspecified fracture of right lower leg, initial encounter for closed fracture: Secondary | ICD-10-CM | POA: Diagnosis not present

## 2017-01-10 DIAGNOSIS — M25571 Pain in right ankle and joints of right foot: Secondary | ICD-10-CM | POA: Diagnosis not present

## 2017-01-18 ENCOUNTER — Other Ambulatory Visit: Payer: Self-pay | Admitting: Rheumatology

## 2017-01-18 NOTE — Telephone Encounter (Signed)
OK , reduce MTX to 3 tabs po qweek. Notify Pt.

## 2017-01-18 NOTE — Telephone Encounter (Signed)
Last Visit: 712/18 Next Visit: 03/22/17 Labs: 11/16/16 Mild elevation of creatinine. Creat 1.14 Previous 1.04 GFR 48 Previous 54  Okay to refill MTX?

## 2017-01-25 DIAGNOSIS — R269 Unspecified abnormalities of gait and mobility: Secondary | ICD-10-CM | POA: Diagnosis not present

## 2017-02-03 DIAGNOSIS — S8291XA Unspecified fracture of right lower leg, initial encounter for closed fracture: Secondary | ICD-10-CM | POA: Diagnosis not present

## 2017-02-05 DIAGNOSIS — M069 Rheumatoid arthritis, unspecified: Secondary | ICD-10-CM | POA: Diagnosis not present

## 2017-02-05 DIAGNOSIS — G894 Chronic pain syndrome: Secondary | ICD-10-CM | POA: Diagnosis not present

## 2017-02-05 DIAGNOSIS — M150 Primary generalized (osteo)arthritis: Secondary | ICD-10-CM | POA: Diagnosis not present

## 2017-02-07 ENCOUNTER — Telehealth: Payer: Self-pay | Admitting: Rheumatology

## 2017-02-07 NOTE — Telephone Encounter (Signed)
FYI: Patient called to cancel upcoming appt with Dr. Corliss Skains. When asked if she wanted to reschedule patient declined stating she is very dissatisfied with our office, and will not be returning.

## 2017-02-13 DIAGNOSIS — R42 Dizziness and giddiness: Secondary | ICD-10-CM | POA: Diagnosis not present

## 2017-02-13 DIAGNOSIS — K219 Gastro-esophageal reflux disease without esophagitis: Secondary | ICD-10-CM | POA: Diagnosis not present

## 2017-02-13 DIAGNOSIS — G4700 Insomnia, unspecified: Secondary | ICD-10-CM | POA: Diagnosis not present

## 2017-02-13 DIAGNOSIS — R1310 Dysphagia, unspecified: Secondary | ICD-10-CM | POA: Diagnosis not present

## 2017-02-13 DIAGNOSIS — Z23 Encounter for immunization: Secondary | ICD-10-CM | POA: Diagnosis not present

## 2017-02-13 DIAGNOSIS — K5900 Constipation, unspecified: Secondary | ICD-10-CM | POA: Diagnosis not present

## 2017-02-13 DIAGNOSIS — M069 Rheumatoid arthritis, unspecified: Secondary | ICD-10-CM | POA: Diagnosis not present

## 2017-02-13 DIAGNOSIS — I10 Essential (primary) hypertension: Secondary | ICD-10-CM | POA: Diagnosis not present

## 2017-02-13 DIAGNOSIS — E039 Hypothyroidism, unspecified: Secondary | ICD-10-CM | POA: Diagnosis not present

## 2017-02-13 DIAGNOSIS — R0602 Shortness of breath: Secondary | ICD-10-CM | POA: Diagnosis not present

## 2017-02-13 DIAGNOSIS — E785 Hyperlipidemia, unspecified: Secondary | ICD-10-CM | POA: Diagnosis not present

## 2017-02-13 DIAGNOSIS — R269 Unspecified abnormalities of gait and mobility: Secondary | ICD-10-CM | POA: Diagnosis not present

## 2017-02-13 DIAGNOSIS — R2689 Other abnormalities of gait and mobility: Secondary | ICD-10-CM | POA: Diagnosis not present

## 2017-02-13 DIAGNOSIS — Z79899 Other long term (current) drug therapy: Secondary | ICD-10-CM | POA: Diagnosis not present

## 2017-02-21 ENCOUNTER — Ambulatory Visit (INDEPENDENT_AMBULATORY_CARE_PROVIDER_SITE_OTHER): Payer: Medicare Other | Admitting: Cardiology

## 2017-02-21 ENCOUNTER — Encounter: Payer: Self-pay | Admitting: Cardiology

## 2017-02-21 VITALS — BP 132/76 | HR 60 | Ht 63.0 in | Wt 199.1 lb

## 2017-02-21 DIAGNOSIS — E782 Mixed hyperlipidemia: Secondary | ICD-10-CM | POA: Diagnosis not present

## 2017-02-21 DIAGNOSIS — Q245 Malformation of coronary vessels: Secondary | ICD-10-CM

## 2017-02-21 DIAGNOSIS — I10 Essential (primary) hypertension: Secondary | ICD-10-CM

## 2017-02-21 DIAGNOSIS — I209 Angina pectoris, unspecified: Secondary | ICD-10-CM

## 2017-02-21 DIAGNOSIS — E669 Obesity, unspecified: Secondary | ICD-10-CM

## 2017-02-21 HISTORY — DX: Malformation of coronary vessels: Q24.5

## 2017-02-21 NOTE — Patient Instructions (Signed)
Medication Instructions:  °Your physician recommends that you continue on your current medications as directed. Please refer to the Current Medication list given to you today. ° ° °Labwork: °None ° ° °Testing/Procedures: °None  ° °Follow-Up: °1 year ° °Any Other Special Instructions Will Be Listed Below (If Applicable). ° °If you need a refill on your cardiac medications before your next appointment, please call your pharmacy. ° °

## 2017-02-21 NOTE — Progress Notes (Signed)
Cardiology Office Note:    Date:  02/21/2017   ID:  [name], DOB 03/07/1946, MRN [id number]  PCP:  Lucianne Lei, MD  Cardiologist:  Garwin Brothers, MD   Referring MD: Lucianne Lei, MD    ASSESSMENT:    1. Angina pectoris (HCC)   2. Essential hypertension   3. Mixed hyperlipidemia   4. Obesity (BMI 35.0-39.9 without comorbidity)   5. Coronary-myocardial bridge    PLAN:    In order of problems listed above:  1. I discussed my findings with the patient. She is asymptomatic from myocardial bridging. 2.  She has gained significant weight in the past several months and discussed the importance and the risks with this situation and she understands. She is going to diet . And try to do better. 3. Blood pressure stable. Risks of obesity explained. Her lipids are followed by her primary care physician. She will be seen in follow-up appointment in 1 year or earlier if she has any concerns. 4. She is being evaluated and treated by a geriatric physician and neurologist for gait instability. Fall precaution was advised.   Medication Adjustments/Labs and Tests Ordered: Current medicines are reviewed at length with the patient today.  Concerns regarding medicines are outlined above.  No orders of the defined types were placed in this encounter.  No orders of the defined types were placed in this encounter.    History of Present Illness:    Deborah Farley is a 71 y.o. female who is being seen today for the evaluation of chest pain and myocardial bridging at the request of Lucianne Lei, MD. Patient is a pleasant 71 year old female. She has past medical history of angina and was found to have a myocardial bridge. Subsequently she has been on medical therapy and done fine. No chest pain orthopnea or PND. She's had significant stressors at home including separation from her husband and with the stress she also has had a fall and fractured her lower extremity and now leads a sedentary  lifestyle. She tells me that she has gained weight in the past several months. She denies any chest pain orthopnea or PND. At the time of my evaluation she is alert awake oriented and in no distress.  Past Medical History:  Diagnosis Date  . Depression   . Diabetes mellitus without complication (HCC)   . Heart disease   . Hernia, hiatal   . High cholesterol   . Hypertension   . Hypothyroidism   . Memory difficulty   . Spastic esophagus     Past Surgical History:  Procedure Laterality Date  . ABDOMINAL HYSTERECTOMY    . BACK SURGERY    . CARDIAC CATHETERIZATION N/A 08/12/2015   Procedure: Left Heart Cath and Coronary Angiography;  Surgeon: Yates Decamp, MD;  Location: [facility] INVASIVE CV LAB;  Service: Cardiovascular;  Laterality: N/A;  . TOTAL ABDOMINAL HYSTERECTOMY W/ BILATERAL SALPINGOOPHORECTOMY      Current Medications: Current Meds  Medication Sig  . aspirin EC 81 MG tablet Take 81 mg by mouth daily.  . bumetanide (BUMEX) 1 MG tablet Take 1 mg by mouth daily as needed. prn  . buPROPion (WELLBUTRIN XL) 300 MG 24 hr tablet   . Cholecalciferol (VITAMIN D) 2000 units CAPS Take 2,000 Units by mouth daily.  Marland Kitchen diltiazem (CARDIZEM CD) 180 MG 24 hr capsule Take 1 capsule (180 mg total) by mouth daily.  Marland Kitchen donepezil (ARICEPT) 10 MG tablet Take 10 mg by mouth at bedtime.  . enalapril (VASOTEC)  20 MG tablet Take 20 mg by mouth daily.  . folic acid (FOLVITE) 400 MCG tablet Take 400 mcg by mouth daily.  Marland Kitchen levothyroxine (SYNTHROID, LEVOTHROID) 25 MCG tablet Take 25 mcg by mouth daily before breakfast.  . methotrexate (RHEUMATREX) 2.5 MG tablet Take 3 tablets (7.5 mg total) by mouth once a week. (Patient taking differently: Take 10 mg by mouth once a week. )  . metoprolol tartrate (LOPRESSOR) 25 MG tablet Take 25 mg by mouth 2 (two) times daily.  . Multiple Vitamin (MULTIVITAMIN) capsule Take 1 capsule by mouth daily.  Marland Kitchen omeprazole (PRILOSEC) 20 MG capsule Take 20 mg by mouth 2 (two) times daily  before a meal.  . polyethylene glycol (MIRALAX / GLYCOLAX) packet Take 17 g by mouth daily as needed for mild constipation.   . pravastatin (PRAVACHOL) 10 MG tablet Take 10 mg by mouth daily.  . sertraline (ZOLOFT) 50 MG tablet Take 50 mg by mouth daily.  . traMADol (ULTRAM) 50 MG tablet Take one to two po q 6 hours prn pain  . traZODone (DESYREL) 50 MG tablet Take 1 tablet (50 mg total) by mouth at bedtime.  . Vitamin D, Ergocalciferol, (DRISDOL) 50000 units CAPS capsule Take 50,000 Units by mouth every 21 ( twenty-one) days.     Allergies:   Codeine and Iodine   Social History   Social History  . Marital status: Married    Spouse name: N/A  . Number of children: N/A  . Years of education: N/A   Social History Main Topics  . Smoking status: Never Smoker  . Smokeless tobacco: Never Used  . Alcohol use No  . Drug use: No  . Sexual activity: Not Asked   Other Topics Concern  . None   Social History Narrative  . None     Family History: The patient's family history includes Heart disease in her father and mother.  ROS:   Please see the history of present illness.    All other systems reviewed and are negative.  EKGs/Labs/Other Studies Reviewed:    The following studies were reviewed today: I reviewed previous records from his visits and discussed them with the patient at extensive length.   Recent Labs: 03/01/2016: TSH 2.69 11/16/2016: ALT 22; BUN 16; Creat 1.14; Hemoglobin 16.0; Platelets 286; Potassium 4.7; Sodium 137  Recent Lipid Panel No results found for: CHOL, TRIG, HDL, CHOLHDL, VLDL, LDLCALC, LDLDIRECT  Physical Exam:    VS:  BP 132/76   Pulse 60   Ht 5\' 3"  (1.6 m)   Wt 199 lb 1.3 oz (90.3 kg)   SpO2 97%   BMI 35.27 kg/m     Wt Readings from Last 3 Encounters:  02/21/17 199 lb 1.3 oz (90.3 kg)  06/01/16 205 lb (93 kg)  03/01/16 218 lb (98.9 kg)     GEN: Patient is in no acute distress HEENT: Normal NECK: No JVD; No carotid bruits LYMPHATICS:  No lymphadenopathy CARDIAC: S1 S2 regular, 2/6 systolic murmur at the apex. RESPIRATORY:  Clear to auscultation without rales, wheezing or rhonchi  ABDOMEN: Soft, non-tender, non-distended MUSCULOSKELETAL:  No edema; No deformity  SKIN: Warm and dry NEUROLOGIC:  Alert and oriented x 3 PSYCHIATRIC:  Normal affect    Signed, 03/03/16, MD  02/21/2017 4:08 PM     [facility]

## 2017-02-27 DIAGNOSIS — H40003 Preglaucoma, unspecified, bilateral: Secondary | ICD-10-CM | POA: Diagnosis not present

## 2017-02-27 DIAGNOSIS — H2513 Age-related nuclear cataract, bilateral: Secondary | ICD-10-CM | POA: Diagnosis not present

## 2017-03-01 DIAGNOSIS — R269 Unspecified abnormalities of gait and mobility: Secondary | ICD-10-CM | POA: Diagnosis not present

## 2017-03-01 DIAGNOSIS — I509 Heart failure, unspecified: Secondary | ICD-10-CM | POA: Diagnosis not present

## 2017-03-01 DIAGNOSIS — I110 Hypertensive heart disease with heart failure: Secondary | ICD-10-CM | POA: Diagnosis not present

## 2017-03-01 DIAGNOSIS — M069 Rheumatoid arthritis, unspecified: Secondary | ICD-10-CM | POA: Diagnosis not present

## 2017-03-01 DIAGNOSIS — Z09 Encounter for follow-up examination after completed treatment for conditions other than malignant neoplasm: Secondary | ICD-10-CM | POA: Diagnosis not present

## 2017-03-01 DIAGNOSIS — Z79899 Other long term (current) drug therapy: Secondary | ICD-10-CM | POA: Diagnosis not present

## 2017-03-05 DIAGNOSIS — S8291XA Unspecified fracture of right lower leg, initial encounter for closed fracture: Secondary | ICD-10-CM | POA: Diagnosis not present

## 2017-03-08 DIAGNOSIS — Z1211 Encounter for screening for malignant neoplasm of colon: Secondary | ICD-10-CM | POA: Diagnosis not present

## 2017-03-08 DIAGNOSIS — Z23 Encounter for immunization: Secondary | ICD-10-CM | POA: Diagnosis not present

## 2017-03-14 DIAGNOSIS — G894 Chronic pain syndrome: Secondary | ICD-10-CM | POA: Diagnosis not present

## 2017-03-14 DIAGNOSIS — M069 Rheumatoid arthritis, unspecified: Secondary | ICD-10-CM | POA: Diagnosis not present

## 2017-03-14 DIAGNOSIS — M150 Primary generalized (osteo)arthritis: Secondary | ICD-10-CM | POA: Diagnosis not present

## 2017-03-14 DIAGNOSIS — R682 Dry mouth, unspecified: Secondary | ICD-10-CM | POA: Diagnosis not present

## 2017-03-15 DIAGNOSIS — R269 Unspecified abnormalities of gait and mobility: Secondary | ICD-10-CM

## 2017-03-15 HISTORY — DX: Unspecified abnormalities of gait and mobility: R26.9

## 2017-03-22 ENCOUNTER — Ambulatory Visit: Payer: Medicare Other | Admitting: Rheumatology

## 2017-03-22 DIAGNOSIS — R42 Dizziness and giddiness: Secondary | ICD-10-CM | POA: Diagnosis not present

## 2017-03-22 DIAGNOSIS — R269 Unspecified abnormalities of gait and mobility: Secondary | ICD-10-CM | POA: Diagnosis not present

## 2017-03-22 DIAGNOSIS — R930 Abnormal findings on diagnostic imaging of skull and head, not elsewhere classified: Secondary | ICD-10-CM | POA: Diagnosis not present

## 2017-03-26 ENCOUNTER — Telehealth (INDEPENDENT_AMBULATORY_CARE_PROVIDER_SITE_OTHER): Payer: Self-pay

## 2017-03-26 ENCOUNTER — Telehealth: Payer: Self-pay | Admitting: Rheumatology

## 2017-03-26 NOTE — Telephone Encounter (Signed)
Leeon with [facility] would like clarification on Rx's for patient.  Stated that patient's pharmacy had switched and they had received patient's Rx's.  Dr. Corliss Skains prescribed Trazodone.  CB# [phone].  Please advise.  Thank you.

## 2017-03-26 NOTE — Telephone Encounter (Signed)
Advised the [facility] that she is no longer being treated at our office per patient.

## 2017-03-26 NOTE — Telephone Encounter (Signed)
Advised the [facility] that she is no longer being treated at our office per patient.  

## 2017-03-26 NOTE — Telephone Encounter (Signed)
Doctor prescribed RX for Trazodone. Patient is currently on two other similar medications. Please call to advise.

## 2017-04-05 DIAGNOSIS — S8291XA Unspecified fracture of right lower leg, initial encounter for closed fracture: Secondary | ICD-10-CM | POA: Diagnosis not present

## 2017-04-10 DIAGNOSIS — I10 Essential (primary) hypertension: Secondary | ICD-10-CM | POA: Diagnosis not present

## 2017-04-10 DIAGNOSIS — E039 Hypothyroidism, unspecified: Secondary | ICD-10-CM | POA: Diagnosis not present

## 2017-04-10 DIAGNOSIS — Z79899 Other long term (current) drug therapy: Secondary | ICD-10-CM | POA: Diagnosis not present

## 2017-04-10 DIAGNOSIS — E119 Type 2 diabetes mellitus without complications: Secondary | ICD-10-CM | POA: Diagnosis not present

## 2017-04-10 DIAGNOSIS — E559 Vitamin D deficiency, unspecified: Secondary | ICD-10-CM | POA: Diagnosis not present

## 2017-04-24 DIAGNOSIS — E785 Hyperlipidemia, unspecified: Secondary | ICD-10-CM | POA: Diagnosis not present

## 2017-04-24 DIAGNOSIS — Z0000 Encounter for general adult medical examination without abnormal findings: Secondary | ICD-10-CM | POA: Diagnosis not present

## 2017-04-24 DIAGNOSIS — Z139 Encounter for screening, unspecified: Secondary | ICD-10-CM | POA: Diagnosis not present

## 2017-04-26 DIAGNOSIS — E785 Hyperlipidemia, unspecified: Secondary | ICD-10-CM | POA: Diagnosis not present

## 2017-04-26 DIAGNOSIS — I10 Essential (primary) hypertension: Secondary | ICD-10-CM | POA: Diagnosis not present

## 2017-04-26 DIAGNOSIS — Q245 Malformation of coronary vessels: Secondary | ICD-10-CM | POA: Diagnosis not present

## 2017-04-26 DIAGNOSIS — E119 Type 2 diabetes mellitus without complications: Secondary | ICD-10-CM | POA: Diagnosis not present

## 2017-04-26 DIAGNOSIS — Z1211 Encounter for screening for malignant neoplasm of colon: Secondary | ICD-10-CM | POA: Diagnosis not present

## 2017-05-04 DIAGNOSIS — Z1231 Encounter for screening mammogram for malignant neoplasm of breast: Secondary | ICD-10-CM | POA: Diagnosis not present

## 2017-05-05 DIAGNOSIS — S8291XA Unspecified fracture of right lower leg, initial encounter for closed fracture: Secondary | ICD-10-CM | POA: Diagnosis not present

## 2017-05-31 ENCOUNTER — Other Ambulatory Visit: Payer: Self-pay | Admitting: Rheumatology

## 2017-05-31 NOTE — Telephone Encounter (Signed)
Patient is no longer being treated at our office per patient.

## 2017-06-05 DIAGNOSIS — S8291XA Unspecified fracture of right lower leg, initial encounter for closed fracture: Secondary | ICD-10-CM | POA: Diagnosis not present

## 2017-06-14 DIAGNOSIS — M069 Rheumatoid arthritis, unspecified: Secondary | ICD-10-CM | POA: Diagnosis not present

## 2017-06-14 DIAGNOSIS — R682 Dry mouth, unspecified: Secondary | ICD-10-CM | POA: Diagnosis not present

## 2017-06-14 DIAGNOSIS — M150 Primary generalized (osteo)arthritis: Secondary | ICD-10-CM | POA: Diagnosis not present

## 2017-06-14 DIAGNOSIS — F5101 Primary insomnia: Secondary | ICD-10-CM | POA: Diagnosis not present

## 2017-06-14 DIAGNOSIS — G894 Chronic pain syndrome: Secondary | ICD-10-CM | POA: Diagnosis not present

## 2017-07-05 DIAGNOSIS — S8291XA Unspecified fracture of right lower leg, initial encounter for closed fracture: Secondary | ICD-10-CM | POA: Diagnosis not present

## 2017-08-03 DIAGNOSIS — S8291XA Unspecified fracture of right lower leg, initial encounter for closed fracture: Secondary | ICD-10-CM | POA: Diagnosis not present

## 2017-08-13 DIAGNOSIS — I10 Essential (primary) hypertension: Secondary | ICD-10-CM | POA: Diagnosis not present

## 2017-08-13 DIAGNOSIS — E119 Type 2 diabetes mellitus without complications: Secondary | ICD-10-CM | POA: Diagnosis not present

## 2017-08-13 DIAGNOSIS — I2510 Atherosclerotic heart disease of native coronary artery without angina pectoris: Secondary | ICD-10-CM | POA: Diagnosis not present

## 2017-08-13 DIAGNOSIS — E039 Hypothyroidism, unspecified: Secondary | ICD-10-CM | POA: Diagnosis not present

## 2017-08-28 DIAGNOSIS — H2513 Age-related nuclear cataract, bilateral: Secondary | ICD-10-CM | POA: Diagnosis not present

## 2017-08-28 DIAGNOSIS — H401111 Primary open-angle glaucoma, right eye, mild stage: Secondary | ICD-10-CM | POA: Diagnosis not present

## 2017-08-28 DIAGNOSIS — H401122 Primary open-angle glaucoma, left eye, moderate stage: Secondary | ICD-10-CM | POA: Diagnosis not present

## 2017-10-03 DIAGNOSIS — R682 Dry mouth, unspecified: Secondary | ICD-10-CM | POA: Diagnosis not present

## 2017-10-03 DIAGNOSIS — F5101 Primary insomnia: Secondary | ICD-10-CM | POA: Diagnosis not present

## 2017-10-03 DIAGNOSIS — M150 Primary generalized (osteo)arthritis: Secondary | ICD-10-CM | POA: Diagnosis not present

## 2017-10-03 DIAGNOSIS — M069 Rheumatoid arthritis, unspecified: Secondary | ICD-10-CM | POA: Diagnosis not present

## 2017-10-03 DIAGNOSIS — G894 Chronic pain syndrome: Secondary | ICD-10-CM | POA: Diagnosis not present

## 2017-10-26 ENCOUNTER — Other Ambulatory Visit: Payer: Self-pay

## 2017-10-26 NOTE — Patient Outreach (Signed)
[facility]) Care Management  10/26/2017  Kadra Kohan 08-14-1945 [id number]   Medication Adherence call to Mrs : Laiyla Slagel left a message for patient to call back patient is due on Pravastatin 10 mg  Mrs. Tello is showing past due under [facility] Ins.  Lillia Abed CPhT Pharmacy Technician [facility] Management Direct Dial [phone]  Fax [phone] [email or web address]

## 2017-11-05 ENCOUNTER — Encounter: Payer: Self-pay | Admitting: Cardiology

## 2017-11-05 DIAGNOSIS — E039 Hypothyroidism, unspecified: Secondary | ICD-10-CM | POA: Diagnosis not present

## 2017-11-05 DIAGNOSIS — I10 Essential (primary) hypertension: Secondary | ICD-10-CM | POA: Diagnosis not present

## 2017-11-05 DIAGNOSIS — E119 Type 2 diabetes mellitus without complications: Secondary | ICD-10-CM | POA: Diagnosis not present

## 2017-11-05 DIAGNOSIS — E782 Mixed hyperlipidemia: Secondary | ICD-10-CM | POA: Diagnosis not present

## 2017-11-13 DIAGNOSIS — H2513 Age-related nuclear cataract, bilateral: Secondary | ICD-10-CM | POA: Diagnosis not present

## 2017-11-13 DIAGNOSIS — I10 Essential (primary) hypertension: Secondary | ICD-10-CM | POA: Diagnosis not present

## 2017-11-13 DIAGNOSIS — E039 Hypothyroidism, unspecified: Secondary | ICD-10-CM | POA: Diagnosis not present

## 2017-11-13 DIAGNOSIS — E1169 Type 2 diabetes mellitus with other specified complication: Secondary | ICD-10-CM | POA: Diagnosis not present

## 2017-11-13 DIAGNOSIS — H401122 Primary open-angle glaucoma, left eye, moderate stage: Secondary | ICD-10-CM | POA: Diagnosis not present

## 2017-11-13 DIAGNOSIS — E782 Mixed hyperlipidemia: Secondary | ICD-10-CM | POA: Diagnosis not present

## 2017-11-13 DIAGNOSIS — H401111 Primary open-angle glaucoma, right eye, mild stage: Secondary | ICD-10-CM | POA: Diagnosis not present

## 2017-12-07 DIAGNOSIS — E119 Type 2 diabetes mellitus without complications: Secondary | ICD-10-CM | POA: Diagnosis not present

## 2017-12-26 DIAGNOSIS — E039 Hypothyroidism, unspecified: Secondary | ICD-10-CM | POA: Diagnosis not present

## 2018-02-07 DIAGNOSIS — F5101 Primary insomnia: Secondary | ICD-10-CM | POA: Diagnosis not present

## 2018-02-07 DIAGNOSIS — G894 Chronic pain syndrome: Secondary | ICD-10-CM | POA: Diagnosis not present

## 2018-02-07 DIAGNOSIS — M069 Rheumatoid arthritis, unspecified: Secondary | ICD-10-CM | POA: Diagnosis not present

## 2018-02-07 DIAGNOSIS — M150 Primary generalized (osteo)arthritis: Secondary | ICD-10-CM | POA: Diagnosis not present

## 2018-02-07 DIAGNOSIS — R682 Dry mouth, unspecified: Secondary | ICD-10-CM | POA: Diagnosis not present

## 2018-02-11 DIAGNOSIS — R0789 Other chest pain: Secondary | ICD-10-CM | POA: Diagnosis not present

## 2018-02-11 DIAGNOSIS — E039 Hypothyroidism, unspecified: Secondary | ICD-10-CM | POA: Diagnosis not present

## 2018-02-11 DIAGNOSIS — I25110 Atherosclerotic heart disease of native coronary artery with unstable angina pectoris: Secondary | ICD-10-CM | POA: Diagnosis not present

## 2018-02-11 DIAGNOSIS — Z23 Encounter for immunization: Secondary | ICD-10-CM | POA: Diagnosis not present

## 2018-02-13 ENCOUNTER — Ambulatory Visit (INDEPENDENT_AMBULATORY_CARE_PROVIDER_SITE_OTHER): Payer: Medicare Other | Admitting: Cardiology

## 2018-02-13 ENCOUNTER — Encounter: Payer: Self-pay | Admitting: Cardiology

## 2018-02-13 VITALS — BP 128/72 | HR 54 | Ht 63.0 in | Wt 204.0 lb

## 2018-02-13 DIAGNOSIS — E782 Mixed hyperlipidemia: Secondary | ICD-10-CM

## 2018-02-13 DIAGNOSIS — I10 Essential (primary) hypertension: Secondary | ICD-10-CM

## 2018-02-13 DIAGNOSIS — Q245 Malformation of coronary vessels: Secondary | ICD-10-CM

## 2018-02-13 DIAGNOSIS — R079 Chest pain, unspecified: Secondary | ICD-10-CM | POA: Diagnosis not present

## 2018-02-13 DIAGNOSIS — I209 Angina pectoris, unspecified: Secondary | ICD-10-CM | POA: Diagnosis not present

## 2018-02-13 MED ORDER — PREDNISONE 50 MG PO TABS: ORAL_TABLET | ORAL | 0 refills | Status: DC

## 2018-02-13 MED ORDER — PREDNISONE 1 MG PO TABS: 50.0000 mg | ORAL_TABLET | Freq: Four times a day (QID) | ORAL | Status: AC

## 2018-02-13 NOTE — Addendum Note (Signed)
Addended by: Craige Cotta on: 02/13/2018 03:16 PM   Modules accepted: Orders

## 2018-02-13 NOTE — Patient Instructions (Signed)
Medication Instructions:  Your physician recommends that you continue on your current medications as directed. Please refer to the Current Medication list given to you today.  If you need a refill on your cardiac medications before your next appointment, please call your pharmacy.   Lab work: None needed  If you have labs (blood work) drawn today and your tests are completely normal, you will receive your results only by: Marland Kitchen MyChart Message (if you have MyChart) OR . A paper copy in the mail If you have any lab test that is abnormal or we need to change your treatment, we will call you to review the results.  Testing/Procedures: A chest x-ray takes a picture of the organs and structures inside the chest, including the heart, lungs, and blood vessels. This test can show several things, including, whether the heart is enlarges; whether fluid is building up in the lungs; and whether pacemaker / defibrillator leads are still in place.     [facility] CARDIOVASCULAR DIVISION [facility]ART[facility] AT [place] [address] [place] [address] Dept: [phone] Loc: [phone]  Syria Kestner  02/13/2018  You are scheduled for a Cardiac Catheterization on Wednesday, October 16 with Dr. Nicki Guadalajara.  1. Please arrive at the [facility] (Main Entrance A) at [facility]: [address] [place], [place] [address] at 6:30 AM (This time is two hours before your procedure to ensure your preparation). Free valet parking service is available.   Special note: Every effort is made to have your procedure done on time. Please understand that emergencies sometimes delay scheduled procedures.  2. Diet: Do not eat solid foods after midnight.  The patient may have clear liquids until 5am upon the day of the procedure.  3. Labs: None needed.  4. Medication instructions in preparation for your procedure:   Contrast Allergy: Yes, Please take Prednisone 50mg  by mouth  at: Thirteen hours prior to cath 7:00pm on Tuesday Seven hours prior to cath 1:00am on Wednesday And prior to leaving home please take last dose of Prednisone 50mg  and Benadryl 50mg  by mouth.  On the morning of your procedure, take your Aspirin and any morning medicines NOT listed above.  You may use sips of water.  5. Plan for one night stay--bring personal belongings. 6. Bring a current list of your medications and current insurance cards. 7. You MUST have a responsible person to drive you home. 8. Someone MUST be with you the first 24 hours after you arrive home or your discharge will be delayed. 9. Please wear clothes that are easy to get on and off and wear slip-on shoes.  Thank you for allowing Sunday to care for you!   --  Invasive Cardiovascular services   Follow-Up: At [facility], you and your health needs are our priority.  As part of our continuing mission to provide you with exceptional heart care, we have created designated Provider Care Teams.  These Care Teams include your primary Cardiologist (physician) and Advanced Practice Providers (APPs -  Physician Assistants and Nurse Practitioners) who all work together to provide you with the care you need, when you need it.  You will need a follow up appointment in 4 weeks.  Please call our office 2 months in advance to schedule this appointment.  You may see another member of our Provider Team in [place]: [place], MD . [facility], MD  Any Other Special Instructions Will Be Listed Below (If Applicable).  Coronary Angiogram With Stent Coronary angiogram with stent placement is a procedure to widen or open a narrow blood vessel of the heart (coronary artery). Arteries may become blocked by cholesterol buildup (plaques) in the lining of the wall. When a coronary artery becomes partially blocked, blood flow to that area decreases. This may lead to chest pain or a heart attack (myocardial  infarction). A stent is a small piece of metal that looks like mesh or a spring. Stent placement may be done as treatment for a heart attack or right after a coronary angiogram in which a blocked artery is found. Let your health care provider know about:  Any allergies you have.  All medicines you are taking, including vitamins, herbs, eye drops, creams, and over-the-counter medicines.  Any problems you or family members have had with anesthetic medicines.  Any blood disorders you have.  Any surgeries you have had.  Any medical conditions you have.  Whether you are pregnant or may be pregnant. What are the risks? Generally, this is a safe procedure. However, problems may occur, including:  Damage to the heart or its blood vessels.  A return of blockage.  Bleeding, infection, or bruising at the insertion site.  A collection of blood under the skin (hematoma) at the insertion site.  A blood clot in another part of the body.  Kidney injury.  Allergic reaction to the dye or contrast that is used.  Bleeding into the abdomen (retroperitoneal bleeding).  What happens before the procedure? Staying hydrated Follow instructions from your health care provider about hydration, which may include:  Up to 2 hours before the procedure - you may continue to drink clear liquids, such as water, clear fruit juice, black coffee, and plain tea.  Eating and drinking restrictions Follow instructions from your health care provider about eating and drinking, which may include:  8 hours before the procedure - stop eating heavy meals or foods such as meat, fried foods, or fatty foods.  6 hours before the procedure - stop eating light meals or foods, such as toast or cereal.  2 hours before the procedure - stop drinking clear liquids.  Ask your health care provider about:  Changing or stopping your regular medicines. This is especially important if you are taking diabetes medicines or blood  thinners.  Taking medicines such as ibuprofen. These medicines can thin your blood. Do not take these medicines before your procedure if your health care provider instructs you not to. Generally, aspirin is recommended before a procedure of passing a small, thin tube (catheter) through a blood vessel and into the heart (cardiac catheterization).  What happens during the procedure?  An IV tube will be inserted into one of your veins.  You will be given one or more of the following: ? A medicine to help you relax (sedative). ? A medicine to numb the area where the catheter will be inserted into an artery (local anesthetic).  To reduce your risk of infection: ? Your health care team will wash or sanitize their hands. ? Your skin will be washed with soap. ? Hair may be removed from the area where the catheter will be inserted.  Using a guide wire, the catheter will be inserted into an artery. The location may be in your groin, in your wrist, or in the fold of your arm (near your elbow).  A type of X-ray (fluoroscopy) will be used to help guide the catheter to the opening of the arteries in the heart.  A dye will be injected into the catheter, and X-rays will be taken. The dye will help to show where any narrowing or blockages are located in the arteries.  A tiny wire will be guided to the blocked spot, and a balloon will be inflated to make the artery wider.  The stent will be expanded and will crush the plaques into the wall of the vessel. The stent will hold the area open and improve the blood flow. Most stents have a drug coating to reduce the risk of the stent narrowing over time.  The artery may be made wider using a drill, laser, or other tools to remove plaques.  When the blood flow is better, the catheter will be removed. The lining of the artery will grow over the stent, which stays where it was placed. This procedure may vary among health care providers and hospitals. What  happens after the procedure?  If the procedure is done through the leg, you will be kept in bed lying flat for about 6 hours. You will be instructed to not bend and not cross your legs.  The insertion site will be checked frequently.  The pulse in your foot or wrist will be checked frequently.  You may have additional blood tests, X-rays, and a test that records the electrical activity of your heart (electrocardiogram, or ECG). This information is not intended to replace advice given to you by your health care provider. Make sure you discuss any questions you have with your health care provider. Document Released: 10/29/2002 Document Revised: 12/23/2015 Document Reviewed: 11/28/2015 Elsevier Interactive Patient Education  [employer].

## 2018-02-13 NOTE — H&P (View-Only) (Signed)
Cardiology Office Note:    Date:  02/13/2018   ID:  Deborah Farley, DOB [birth year], MRN [id number]  PCP:  Deborah Miyamoto, MD  Cardiologist:  Deborah Brothers, MD   Referring MD: Deborah Lei, MD    ASSESSMENT:    1. Essential hypertension   2. Coronary-myocardial bridge   3. Angina pectoris (HCC)   4. Mixed hyperlipidemia    PLAN:    In order of problems listed above:  1. Primary prevention stressed with the patient.  Importance of compliance with diet and medication stressed and she vocalized understanding.  Her blood pressure is stable.  Diet was discussed for dyslipidemia and obesity and risks of obesity were explained to the patient and she vocalized understanding and plans to diet and lose weight. 2. Her symptoms are very concerning to me.  They are typical of angina pectoris.  She has had myocardial bridging in the past but it has been rather asymptomatic in this recent flareup of anginal episodes which are relieved with rest and nitroglycerin out of concern to me. 3. In view of the patient's symptoms, I discussed with the patient options for evaluation. Invasive and noninvasive options were given to the patient. I discussed stress testing and coronary angiography and left heart catheterization at length. Benefits, pros and cons of each approach were discussed at length. Patient had multiple questions which were answered to the patient's satisfaction. Patient opted for invasive evaluation and we will set up for coronary angiography and left heart catheterization. Further recommendations will be made based on the findings with coronary angiography. In the interim if the patient has any significant symptoms in hospital to the nearest emergency room.   Medication Adjustments/Labs and Tests Ordered: Current medicines are reviewed at length with the patient today.  Concerns regarding medicines are outlined above.  No orders of the defined types were placed in this  encounter.  No orders of the defined types were placed in this encounter.    No chief complaint on file.    History of Present Illness:    Deborah Farley is a 72 y.o. female.  Patient has history of myocardial bridging by coronary angiography in the past.  This was done about 2 and half years ago.  Subsequently she is done fine.  She has had no symptoms.  Unfortunately she leads a sedentary lifestyle and does not exercise on a regular basis.  Now she mentions to me that over the past several weeks she is noticing substernal chest tightness mostly on exertion.  With her chest tightness she feels it radiating to the neck and is very concerned about it.  She has to stop her effort and rest and has used nitroglycerin in the past recently to relief of chest pain.  The symptoms are very concerning and therefore her primary care physician sent her for evaluation.  At the time of my evaluation, the patient is alert awake oriented and in no distress.  Past Medical History:  Diagnosis Date  . Depression   . Diabetes mellitus without complication (HCC)   . Heart disease   . Hernia, hiatal   . High cholesterol   . Hypertension   . Hypothyroidism   . Memory difficulty   . Spastic esophagus     Past Surgical History:  Procedure Laterality Date  . ABDOMINAL HYSTERECTOMY    . BACK SURGERY    . CARDIAC CATHETERIZATION N/A 08/12/2015   Procedure: Left Heart Cath and Coronary Angiography;  Surgeon: Deborah Decamp, MD;  Location: [facility] INVASIVE CV LAB;  Service: Cardiovascular;  Laterality: N/A;  . TOTAL ABDOMINAL HYSTERECTOMY W/ BILATERAL SALPINGOOPHORECTOMY      Current Medications: Current Meds  Medication Sig  . aspirin EC 81 MG tablet Take 81 mg by mouth daily.  . bumetanide (BUMEX) 1 MG tablet Take 1 mg by mouth daily as needed. prn  . buPROPion (WELLBUTRIN XL) 300 MG 24 hr tablet   . Cholecalciferol (VITAMIN D) 2000 units CAPS Take 2,000 Units by mouth daily.  Marland Kitchen diltiazem (CARDIZEM CD) 180 MG 24 hr  capsule Take 1 capsule (180 mg total) by mouth daily.  . enalapril (VASOTEC) 20 MG tablet Take 20 mg by mouth daily.  . folic acid (FOLVITE) 400 MCG tablet Take 400 mcg by mouth daily.  Marland Kitchen levothyroxine (SYNTHROID, LEVOTHROID) 25 MCG tablet Take 25 mcg by mouth daily before breakfast.  . methotrexate (RHEUMATREX) 2.5 MG tablet Take 3 tablets (7.5 mg total) by mouth once a week. (Patient taking differently: Take 25 mg by mouth once a week. )  . metoprolol tartrate (LOPRESSOR) 25 MG tablet Take 25 mg by mouth 2 (two) times daily.  . Multiple Vitamin (MULTIVITAMIN) capsule Take 1 capsule by mouth daily.  Marland Kitchen omeprazole (PRILOSEC) 20 MG capsule Take 20 mg by mouth 2 (two) times daily before a meal.  . polyethylene glycol (MIRALAX / GLYCOLAX) packet Take 17 g by mouth daily as needed for mild constipation.   . pravastatin (PRAVACHOL) 10 MG tablet Take 10 mg by mouth daily.  . sertraline (ZOLOFT) 50 MG tablet Take 50 mg by mouth daily.  . traMADol (ULTRAM) 50 MG tablet Take one to two po q 6 hours prn pain  . traZODone (DESYREL) 50 MG tablet Take 1 tablet (50 mg total) by mouth at bedtime.  . Vitamin D, Ergocalciferol, (DRISDOL) 50000 units CAPS capsule Take 50,000 Units by mouth every 21 ( twenty-one) days.     Allergies:   Codeine and Iodine   Social History   Socioeconomic History  . Marital status: Married    Spouse name: Not on file  . Number of children: Not on file  . Years of education: Not on file  . Highest education level: Not on file  Occupational History  . Not on file  Social Needs  . Financial resource strain: Not on file  . Food insecurity:    Worry: Not on file    Inability: Not on file  . Transportation needs:    Medical: Not on file    Non-medical: Not on file  Tobacco Use  . Smoking status: Never Smoker  . Smokeless tobacco: Never Used  Substance and Sexual Activity  . Alcohol use: No  . Drug use: No  . Sexual activity: Not on file  Lifestyle  . Physical  activity:    Days per week: Not on file    Minutes per session: Not on file  . Stress: Not on file  Relationships  . Social connections:    Talks on phone: Not on file    Gets together: Not on file    Attends religious service: Not on file    Active member of club or organization: Not on file    Attends meetings of clubs or organizations: Not on file    Relationship status: Not on file  Other Topics Concern  . Not on file  Social History Narrative  . Not on file     Family History: The patient's family history includes Heart disease in  her father and mother.  ROS:   Please see the history of present illness.    All other systems reviewed and are negative.  EKGs/Labs/Other Studies Reviewed:    The following studies were reviewed today: I discussed my findings with the patient at extensive length.   Recent Labs: No results found for requested labs within last 8760 hours.  Recent Lipid Panel No results found for: CHOL, TRIG, HDL, CHOLHDL, VLDL, LDLCALC, LDLDIRECT  Physical Exam:    VS:  BP 128/72 (BP Location: Right Arm, Patient Position: Sitting, Cuff Size: Normal)   Pulse (!) 54   Ht 5\' 3"  (1.6 m)   Wt 204 lb (92.5 kg)   SpO2 97%   BMI 36.14 kg/m     Wt Readings from Last 3 Encounters:  02/13/18 204 lb (92.5 kg)  02/21/17 199 lb 1.3 oz (90.3 kg)  06/01/16 205 lb (93 kg)     GEN: Patient is in no acute distress HEENT: Normal NECK: No JVD; No carotid bruits LYMPHATICS: No lymphadenopathy CARDIAC: Hear sounds regular, 2/6 systolic murmur at the apex. RESPIRATORY:  Clear to auscultation without rales, wheezing or rhonchi  ABDOMEN: Soft, non-tender, non-distended MUSCULOSKELETAL:  No edema; No deformity  SKIN: Warm and dry NEUROLOGIC:  Alert and oriented x 3 PSYCHIATRIC:  Normal affect   Signed, Deborah Brothers, MD  02/13/2018 2:32 PM    [facility]

## 2018-02-13 NOTE — Addendum Note (Signed)
Addended by: Craige Cotta on: 02/13/2018 03:08 PM   Modules accepted: Orders

## 2018-02-13 NOTE — Progress Notes (Signed)
Cardiology Office Note:    Date:  02/13/2018   ID:  Deborah Farley, DOB [birth year], MRN [id number]  PCP:  Deborah Miyamoto, MD  Cardiologist:  Deborah Brothers, MD   Referring MD: Deborah Lei, MD    ASSESSMENT:    1. Essential hypertension   2. Coronary-myocardial bridge   3. Angina pectoris (HCC)   4. Mixed hyperlipidemia    PLAN:    In order of problems listed above:  1. Primary prevention stressed with the patient.  Importance of compliance with diet and medication stressed and she vocalized understanding.  Her blood pressure is stable.  Diet was discussed for dyslipidemia and obesity and risks of obesity were explained to the patient and she vocalized understanding and plans to diet and lose weight. 2. Her symptoms are very concerning to me.  They are typical of angina pectoris.  She has had myocardial bridging in the past but it has been rather asymptomatic in this recent flareup of anginal episodes which are relieved with rest and nitroglycerin out of concern to me. 3. In view of the patient's symptoms, I discussed with the patient options for evaluation. Invasive and noninvasive options were given to the patient. I discussed stress testing and coronary angiography and left heart catheterization at length. Benefits, pros and cons of each approach were discussed at length. Patient had multiple questions which were answered to the patient's satisfaction. Patient opted for invasive evaluation and we will set up for coronary angiography and left heart catheterization. Further recommendations will be made based on the findings with coronary angiography. In the interim if the patient has any significant symptoms in hospital to the nearest emergency room.   Medication Adjustments/Labs and Tests Ordered: Current medicines are reviewed at length with the patient today.  Concerns regarding medicines are outlined above.  No orders of the defined types were placed in this  encounter.  No orders of the defined types were placed in this encounter.    No chief complaint on file.    History of Present Illness:    Deborah Farley is a 72 y.o. female.  Patient has history of myocardial bridging by coronary angiography in the past.  This was done about 2 and half years ago.  Subsequently she is done fine.  She has had no symptoms.  Unfortunately she leads a sedentary lifestyle and does not exercise on a regular basis.  Now she mentions to me that over the past several weeks she is noticing substernal chest tightness mostly on exertion.  With her chest tightness she feels it radiating to the neck and is very concerned about it.  She has to stop her effort and rest and has used nitroglycerin in the past recently to relief of chest pain.  The symptoms are very concerning and therefore her primary care physician sent her for evaluation.  At the time of my evaluation, the patient is alert awake oriented and in no distress.  Past Medical History:  Diagnosis Date  . Depression   . Diabetes mellitus without complication (HCC)   . Heart disease   . Hernia, hiatal   . High cholesterol   . Hypertension   . Hypothyroidism   . Memory difficulty   . Spastic esophagus     Past Surgical History:  Procedure Laterality Date  . ABDOMINAL HYSTERECTOMY    . BACK SURGERY    . CARDIAC CATHETERIZATION N/A 08/12/2015   Procedure: Left Heart Cath and Coronary Angiography;  Surgeon: Deborah Decamp, MD;  Location: [facility] INVASIVE CV LAB;  Service: Cardiovascular;  Laterality: N/A;  . TOTAL ABDOMINAL HYSTERECTOMY W/ BILATERAL SALPINGOOPHORECTOMY      Current Medications: Current Meds  Medication Sig  . aspirin EC 81 MG tablet Take 81 mg by mouth daily.  . bumetanide (BUMEX) 1 MG tablet Take 1 mg by mouth daily as needed. prn  . buPROPion (WELLBUTRIN XL) 300 MG 24 hr tablet   . Cholecalciferol (VITAMIN D) 2000 units CAPS Take 2,000 Units by mouth daily.  Marland Kitchen diltiazem (CARDIZEM CD) 180 MG 24 hr  capsule Take 1 capsule (180 mg total) by mouth daily.  . enalapril (VASOTEC) 20 MG tablet Take 20 mg by mouth daily.  . folic acid (FOLVITE) 400 MCG tablet Take 400 mcg by mouth daily.  Marland Kitchen levothyroxine (SYNTHROID, LEVOTHROID) 25 MCG tablet Take 25 mcg by mouth daily before breakfast.  . methotrexate (RHEUMATREX) 2.5 MG tablet Take 3 tablets (7.5 mg total) by mouth once a week. (Patient taking differently: Take 25 mg by mouth once a week. )  . metoprolol tartrate (LOPRESSOR) 25 MG tablet Take 25 mg by mouth 2 (two) times daily.  . Multiple Vitamin (MULTIVITAMIN) capsule Take 1 capsule by mouth daily.  Marland Kitchen omeprazole (PRILOSEC) 20 MG capsule Take 20 mg by mouth 2 (two) times daily before a meal.  . polyethylene glycol (MIRALAX / GLYCOLAX) packet Take 17 g by mouth daily as needed for mild constipation.   . pravastatin (PRAVACHOL) 10 MG tablet Take 10 mg by mouth daily.  . sertraline (ZOLOFT) 50 MG tablet Take 50 mg by mouth daily.  . traMADol (ULTRAM) 50 MG tablet Take one to two po q 6 hours prn pain  . traZODone (DESYREL) 50 MG tablet Take 1 tablet (50 mg total) by mouth at bedtime.  . Vitamin D, Ergocalciferol, (DRISDOL) 50000 units CAPS capsule Take 50,000 Units by mouth every 21 ( twenty-one) days.     Allergies:   Codeine and Iodine   Social History   Socioeconomic History  . Marital status: Married    Spouse name: Not on file  . Number of children: Not on file  . Years of education: Not on file  . Highest education level: Not on file  Occupational History  . Not on file  Social Needs  . Financial resource strain: Not on file  . Food insecurity:    Worry: Not on file    Inability: Not on file  . Transportation needs:    Medical: Not on file    Non-medical: Not on file  Tobacco Use  . Smoking status: Never Smoker  . Smokeless tobacco: Never Used  Substance and Sexual Activity  . Alcohol use: No  . Drug use: No  . Sexual activity: Not on file  Lifestyle  . Physical  activity:    Days per week: Not on file    Minutes per session: Not on file  . Stress: Not on file  Relationships  . Social connections:    Talks on phone: Not on file    Gets together: Not on file    Attends religious service: Not on file    Active member of club or organization: Not on file    Attends meetings of clubs or organizations: Not on file    Relationship status: Not on file  Other Topics Concern  . Not on file  Social History Narrative  . Not on file     Family History: The patient's family history includes Heart disease in  her father and mother.  ROS:   Please see the history of present illness.    All other systems reviewed and are negative.  EKGs/Labs/Other Studies Reviewed:    The following studies were reviewed today: I discussed my findings with the patient at extensive length.   Recent Labs: No results found for requested labs within last 8760 hours.  Recent Lipid Panel No results found for: CHOL, TRIG, HDL, CHOLHDL, VLDL, LDLCALC, LDLDIRECT  Physical Exam:    VS:  BP 128/72 (BP Location: Right Arm, Patient Position: Sitting, Cuff Size: Normal)   Pulse (!) 54   Ht 5' 3" (1.6 m)   Wt 204 lb (92.5 kg)   SpO2 97%   BMI 36.14 kg/m     Wt Readings from Last 3 Encounters:  02/13/18 204 lb (92.5 kg)  02/21/17 199 lb 1.3 oz (90.3 kg)  06/01/16 205 lb (93 kg)     GEN: Patient is in no acute distress HEENT: Normal NECK: No JVD; No carotid bruits LYMPHATICS: No lymphadenopathy CARDIAC: Hear sounds regular, 2/6 systolic murmur at the apex. RESPIRATORY:  Clear to auscultation without rales, wheezing or rhonchi  ABDOMEN: Soft, non-tender, non-distended MUSCULOSKELETAL:  No edema; No deformity  SKIN: Warm and dry NEUROLOGIC:  Alert and oriented x 3 PSYCHIATRIC:  Normal affect   Signed, Ima Hafner R Evens Meno, MD  02/13/2018 2:32 PM    [facility]  

## 2018-02-19 ENCOUNTER — Telehealth: Payer: Self-pay

## 2018-02-19 NOTE — Telephone Encounter (Signed)
Patient contacted pre-catheterization at [facility] scheduled for:  8:30 am on 02/20/18 Verified arrival time and place:  NT @ 6:30 am Confirmed AM meds to be taken pre-cath with sip of water:  DYE allergy Prednisone x 3 Benadryl with last prednisone dose ASA Confirmed patient has responsible person to drive home post procedure and observe patient for 24 hours:  yes Addl concerns:  none

## 2018-02-20 ENCOUNTER — Ambulatory Visit (HOSPITAL_COMMUNITY)
Admission: RE | Admit: 2018-02-20 | Discharge: 2018-02-20 | Disposition: A | Discharge status: Home / Self Care | Payer: Medicare Other | Source: Ambulatory Visit | Attending: Cardiovascular Disease | Admitting: Cardiovascular Disease

## 2018-02-20 ENCOUNTER — Ambulatory Visit (HOSPITAL_COMMUNITY)
Admission: RE | Disposition: A | Discharge status: Home / Self Care | Payer: Self-pay | Source: Ambulatory Visit | Attending: Cardiovascular Disease

## 2018-02-20 DIAGNOSIS — Z79899 Other long term (current) drug therapy: Secondary | ICD-10-CM | POA: Insufficient documentation

## 2018-02-20 DIAGNOSIS — Z90722 Acquired absence of ovaries, bilateral: Secondary | ICD-10-CM | POA: Insufficient documentation

## 2018-02-20 DIAGNOSIS — Z90710 Acquired absence of both cervix and uterus: Secondary | ICD-10-CM | POA: Diagnosis not present

## 2018-02-20 DIAGNOSIS — Z9889 Other specified postprocedural states: Secondary | ICD-10-CM | POA: Insufficient documentation

## 2018-02-20 DIAGNOSIS — Z7982 Long term (current) use of aspirin: Secondary | ICD-10-CM | POA: Diagnosis not present

## 2018-02-20 DIAGNOSIS — E782 Mixed hyperlipidemia: Secondary | ICD-10-CM | POA: Diagnosis not present

## 2018-02-20 DIAGNOSIS — F329 Major depressive disorder, single episode, unspecified: Secondary | ICD-10-CM | POA: Insufficient documentation

## 2018-02-20 DIAGNOSIS — Z885 Allergy status to narcotic agent status: Secondary | ICD-10-CM | POA: Insufficient documentation

## 2018-02-20 DIAGNOSIS — E119 Type 2 diabetes mellitus without complications: Secondary | ICD-10-CM | POA: Diagnosis not present

## 2018-02-20 DIAGNOSIS — Z888 Allergy status to other drugs, medicaments and biological substances status: Secondary | ICD-10-CM | POA: Insufficient documentation

## 2018-02-20 DIAGNOSIS — Q245 Malformation of coronary vessels: Secondary | ICD-10-CM

## 2018-02-20 DIAGNOSIS — E039 Hypothyroidism, unspecified: Secondary | ICD-10-CM | POA: Diagnosis not present

## 2018-02-20 DIAGNOSIS — I209 Angina pectoris, unspecified: Secondary | ICD-10-CM

## 2018-02-20 DIAGNOSIS — Z955 Presence of coronary angioplasty implant and graft: Secondary | ICD-10-CM | POA: Diagnosis not present

## 2018-02-20 DIAGNOSIS — I25119 Atherosclerotic heart disease of native coronary artery with unspecified angina pectoris: Secondary | ICD-10-CM | POA: Diagnosis not present

## 2018-02-20 DIAGNOSIS — Z8249 Family history of ischemic heart disease and other diseases of the circulatory system: Secondary | ICD-10-CM | POA: Diagnosis not present

## 2018-02-20 DIAGNOSIS — Z79890 Hormone replacement therapy (postmenopausal): Secondary | ICD-10-CM | POA: Diagnosis not present

## 2018-02-20 DIAGNOSIS — I10 Essential (primary) hypertension: Secondary | ICD-10-CM

## 2018-02-20 HISTORY — PX: LEFT HEART CATH AND CORONARY ANGIOGRAPHY: CATH118249

## 2018-02-20 SURGERY — LEFT HEART CATH AND CORONARY ANGIOGRAPHY
Anesthesia: LOCAL

## 2018-02-20 MED ORDER — SODIUM CHLORIDE 0.9 % IV SOLN: 250.0000 mL | INTRAVENOUS | Status: DC | PRN

## 2018-02-20 MED ORDER — VERAPAMIL HCL 2.5 MG/ML IV SOLN
INTRAVENOUS | Status: AC
  Filled 2018-02-20: qty 2

## 2018-02-20 MED ORDER — MIDAZOLAM HCL 2 MG/2ML IJ SOLN
INTRAMUSCULAR | Status: AC
  Filled 2018-02-20: qty 2

## 2018-02-20 MED ORDER — ASPIRIN 81 MG PO CHEW: 81.0000 mg | CHEWABLE_TABLET | ORAL | Status: DC

## 2018-02-20 MED ORDER — SODIUM CHLORIDE 0.9% FLUSH: 3.0000 mL | INTRAVENOUS | Status: DC | PRN

## 2018-02-20 MED ORDER — MIDAZOLAM HCL 2 MG/2ML IJ SOLN
INTRAMUSCULAR | Status: DC | PRN
  Administered 2018-02-20 (×2): 1 mg via INTRAVENOUS

## 2018-02-20 MED ORDER — HEPARIN (PORCINE) IN NACL 1000-0.9 UT/500ML-% IV SOLN
INTRAVENOUS | Status: DC | PRN
  Administered 2018-02-20: 500 mL

## 2018-02-20 MED ORDER — SODIUM CHLORIDE 0.9% FLUSH: 3.0000 mL | Freq: Two times a day (BID) | INTRAVENOUS | Status: DC

## 2018-02-20 MED ORDER — SODIUM CHLORIDE 0.9 % WEIGHT BASED INFUSION: 1.0000 mL/kg/h | INTRAVENOUS | Status: DC

## 2018-02-20 MED ORDER — SODIUM CHLORIDE 0.9 % WEIGHT BASED INFUSION
3.0000 mL/kg/h | INTRAVENOUS | Status: AC
  Administered 2018-02-20: 3 mL/kg/h via INTRAVENOUS

## 2018-02-20 MED ORDER — IOHEXOL 350 MG/ML SOLN
INTRAVENOUS | Status: DC | PRN
  Administered 2018-02-20: 70 mL via INTRA_ARTERIAL

## 2018-02-20 MED ORDER — LIDOCAINE HCL (PF) 1 % IJ SOLN
INTRAMUSCULAR | Status: DC | PRN
  Administered 2018-02-20: 2 mL
  Administered 2018-02-20: 15 mL

## 2018-02-20 MED ORDER — SODIUM CHLORIDE 0.9 % IV SOLN: INTRAVENOUS | Status: DC

## 2018-02-20 MED ORDER — HEPARIN (PORCINE) IN NACL 1000-0.9 UT/500ML-% IV SOLN
INTRAVENOUS | Status: AC
  Filled 2018-02-20: qty 1000

## 2018-02-20 MED ORDER — ASPIRIN 81 MG PO CHEW: 81.0000 mg | CHEWABLE_TABLET | Freq: Every day | ORAL | Status: DC

## 2018-02-20 MED ORDER — FENTANYL CITRATE (PF) 100 MCG/2ML IJ SOLN
INTRAMUSCULAR | Status: AC
  Filled 2018-02-20: qty 2

## 2018-02-20 MED ORDER — ACETAMINOPHEN 325 MG PO TABS: 650.0000 mg | ORAL_TABLET | ORAL | Status: DC | PRN

## 2018-02-20 MED ORDER — ONDANSETRON HCL 4 MG/2ML IJ SOLN: 4.0000 mg | Freq: Four times a day (QID) | INTRAMUSCULAR | Status: DC | PRN

## 2018-02-20 MED ORDER — FENTANYL CITRATE (PF) 100 MCG/2ML IJ SOLN
INTRAMUSCULAR | Status: DC | PRN
  Administered 2018-02-20 (×2): 25 ug via INTRAVENOUS

## 2018-02-20 MED ORDER — LIDOCAINE HCL (PF) 1 % IJ SOLN
INTRAMUSCULAR | Status: AC
  Filled 2018-02-20: qty 30

## 2018-02-20 SURGICAL SUPPLY — 12 items
CATH INFINITI 5FR MULTPACK ANG (CATHETERS) ×2 IMPLANT
GLIDESHEATH SLEND SS 6F .021 (SHEATH) ×2 IMPLANT
GUIDEWIRE INQWIRE 1.5J.035X260 (WIRE) ×1 IMPLANT
INQWIRE 1.5J .035X260CM (WIRE) ×2
KIT HEART LEFT (KITS) ×2 IMPLANT
PACK CARDIAC CATHETERIZATION (CUSTOM PROCEDURE TRAY) ×2 IMPLANT
SHEATH PINNACLE 5F 10CM (SHEATH) ×2 IMPLANT
SHEATH PROBE COVER 6X72 (BAG) ×2 IMPLANT
SYR MEDRAD MARK V 150ML (SYRINGE) ×2 IMPLANT
TRANSDUCER W/STOPCOCK (MISCELLANEOUS) ×2 IMPLANT
TUBING CIL FLEX 10 FLL-RA (TUBING) ×2 IMPLANT
WIRE EMERALD 3MM-J .035X150CM (WIRE) ×2 IMPLANT

## 2018-02-20 NOTE — Discharge Instructions (Signed)
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
## 2018-02-20 NOTE — Interval H&P Note (Signed)
Cath Lab Visit (complete for each Cath Lab visit)  Clinical Evaluation Leading to the Procedure:   ACS: No.  Non-ACS:    Anginal Classification: CCS III  Anti-ischemic medical therapy: Maximal Therapy (2 or more classes of medications)  Non-Invasive Test Results: No non-invasive testing performed  Prior CABG: No previous CABG      History and Physical Interval Note:  02/20/2018 8:49 AM  Deborah Farley  has presented today for surgery, with the diagnosis of Chest Pain  The various methods of treatment have been discussed with the patient and family. After consideration of risks, benefits and other options for treatment, the patient has consented to  Procedure(s): LEFT HEART CATH AND CORONARY ANGIOGRAPHY (N/A) as a surgical intervention .  The patient's history has been reviewed, patient examined, no change in status, stable for surgery.  I have reviewed the patient's chart and labs.  Questions were answered to the patient's satisfaction.     Nicki Guadalajara

## 2018-02-20 NOTE — Progress Notes (Signed)
Deborah Farley, volunteer was handed by visitor patients 1 bank card and ID
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
## 2018-02-20 NOTE — Progress Notes (Signed)
Site area: Right groin 5 french arterial sheath was removed by Mertie Clause RN  Site Prior to Removal:  Level 0  Pressure Applied For 20 MINUTES    Minutes Beginning at 1015am  Manual:   Yes.    Patient Status During Pull:  stable  Post Pull Groin Site:  Level 0  Post Pull Instructions Given:  Yes.    Post Pull Pulses Present:  Yes.    Dressing Applied:  Yes.    Comments:  VS remain stable

## 2018-02-21 ENCOUNTER — Encounter (HOSPITAL_COMMUNITY): Payer: Self-pay | Admitting: Cardiovascular Disease

## 2018-02-21 MED FILL — Verapamil HCl IV Soln 2.5 MG/ML: INTRAVENOUS | Qty: 2 | Status: AC

## 2018-02-25 DIAGNOSIS — I10 Essential (primary) hypertension: Secondary | ICD-10-CM | POA: Diagnosis not present

## 2018-03-15 ENCOUNTER — Encounter: Payer: Self-pay | Admitting: Cardiology

## 2018-03-15 ENCOUNTER — Ambulatory Visit (INDEPENDENT_AMBULATORY_CARE_PROVIDER_SITE_OTHER): Payer: Medicare Other | Admitting: Cardiology

## 2018-03-15 VITALS — BP 118/68 | HR 73 | Ht 63.0 in | Wt 204.0 lb

## 2018-03-15 DIAGNOSIS — I209 Angina pectoris, unspecified: Secondary | ICD-10-CM | POA: Diagnosis not present

## 2018-03-15 DIAGNOSIS — E0800 Diabetes mellitus due to underlying condition with hyperosmolarity without nonketotic hyperglycemic-hyperosmolar coma (NKHHC): Secondary | ICD-10-CM | POA: Diagnosis not present

## 2018-03-15 DIAGNOSIS — E782 Mixed hyperlipidemia: Secondary | ICD-10-CM

## 2018-03-15 DIAGNOSIS — Q245 Malformation of coronary vessels: Secondary | ICD-10-CM | POA: Diagnosis not present

## 2018-03-15 DIAGNOSIS — I10 Essential (primary) hypertension: Secondary | ICD-10-CM | POA: Diagnosis not present

## 2018-03-15 MED ORDER — RANOLAZINE ER 1000 MG PO TB12: 1000.0000 mg | ORAL_TABLET | Freq: Two times a day (BID) | ORAL | 1 refills | Status: DC
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
## 2018-03-15 NOTE — Addendum Note (Signed)
Addended by: Craige Cotta on: 03/15/2018 02:42 PM   Modules accepted: Orders

## 2018-03-15 NOTE — Patient Instructions (Signed)
Medication Instructions:  Your physician has recommended you make the following change in your medication:   START ranexa 500 mg twice daily for 2 weeks then increase to 1,000 mg twice daily  If you need a refill on your cardiac medications before your next appointment, please call your pharmacy.   Lab work: None  If you have labs (blood work) drawn today and your tests are completely normal, you will receive your results only by: Marland Kitchen MyChart Message (if you have MyChart) OR . A paper copy in the mail If you have any lab test that is abnormal or we need to change your treatment, we will call you to review the results.  Testing/Procedures: None  Follow-Up: At [facility], you and your health needs are our priority.  As part of our continuing mission to provide you with exceptional heart care, we have created designated Provider Care Teams.  These Care Teams include your primary Cardiologist (physician) and Advanced Practice Providers (APPs -  Physician Assistants and Nurse Practitioners) who all work together to provide you with the care you need, when you need it.  You will need a follow up appointment in 6 months.  Please call our office 2 months in advance to schedule this appointment.  You may see another member of our [employer] Provider Team in [place]: Gypsy Balsam, MD . Norman Herrlich, MD  Any Other Special Instructions Will Be Listed Below (If Applicable).

## 2018-03-20 ENCOUNTER — Telehealth: Payer: Self-pay

## 2018-03-20 ENCOUNTER — Other Ambulatory Visit: Payer: Self-pay

## 2018-03-20 DIAGNOSIS — E782 Mixed hyperlipidemia: Secondary | ICD-10-CM

## 2018-03-20 DIAGNOSIS — I10 Essential (primary) hypertension: Secondary | ICD-10-CM

## 2018-03-20 MED ORDER — PRAVASTATIN SODIUM 20 MG PO TABS: 20.0000 mg | ORAL_TABLET | Freq: Every evening | ORAL | 0 refills | Status: DC

## 2018-03-20 NOTE — Telephone Encounter (Signed)
Patient was informed based upon review of her recent labs at PCP she should come for LFT's and BMP today per Revankar. Patient will increase pravastatin to 20 mg q daily if LFT's are okay. Patient understands fasting labs to be done in 6 weeks.

## 2018-03-21 LAB — BASIC METABOLIC PANEL
BUN / CREAT RATIO: 14 (ref 12–28)
BUN: 12 mg/dL (ref 8–27)
CO2: 22 mmol/L (ref 20–29)
CREATININE: 0.87 mg/dL (ref 0.57–1.00)
Calcium: 9.1 mg/dL (ref 8.7–10.3)
Chloride: 106 mmol/L (ref 96–106)
GFR calc Af Amer: 76 mL/min/{1.73_m2} (ref >59)
GFR calc non Af Amer: 66 mL/min/{1.73_m2} (ref >59)
GLUCOSE: 70 mg/dL (ref 65–99)
Potassium: 4.4 mmol/L (ref 3.5–5.2)
Sodium: 144 mmol/L (ref 134–144)

## 2018-03-21 LAB — HEPATIC FUNCTION PANEL
ALT: 32 IU/L (ref 0–32)
AST: 37 IU/L (ref 0–40)
Albumin: 4.1 g/dL (ref 3.5–4.8)
Alkaline Phosphatase: 118 IU/L — ABNORMAL HIGH (ref 39–117)
BILIRUBIN, DIRECT: 0.14 mg/dL (ref 0.00–0.40)
Bilirubin Total: 0.4 mg/dL (ref 0.0–1.2)
TOTAL PROTEIN: 6.5 g/dL (ref 6.0–8.5)

## 2018-03-27 DIAGNOSIS — E039 Hypothyroidism, unspecified: Secondary | ICD-10-CM | POA: Diagnosis not present

## 2018-04-06 IMAGING — DX DG ANKLE COMPLETE 3+V*R*
3 series · 3 of 3 positions shown · non-contrast
Comparison: None.

ADDENDUM:
Further review demonstrates a nondisplaced oblique distal right
fibula fracture, better visualized on the subsequent right foot
radiograph views. The original report is otherwise unchanged.

These results were called by telephone at the time of interpretation
on 11/16/2016 at [DATE] to NP ROE REMACHE , who verbally
acknowledged these results.
CLINICAL DATA: Right ankle injury from fall down stairs
EXAM:
RIGHT ANKLE - COMPLETE 3+ VIEW

[ankle ap]
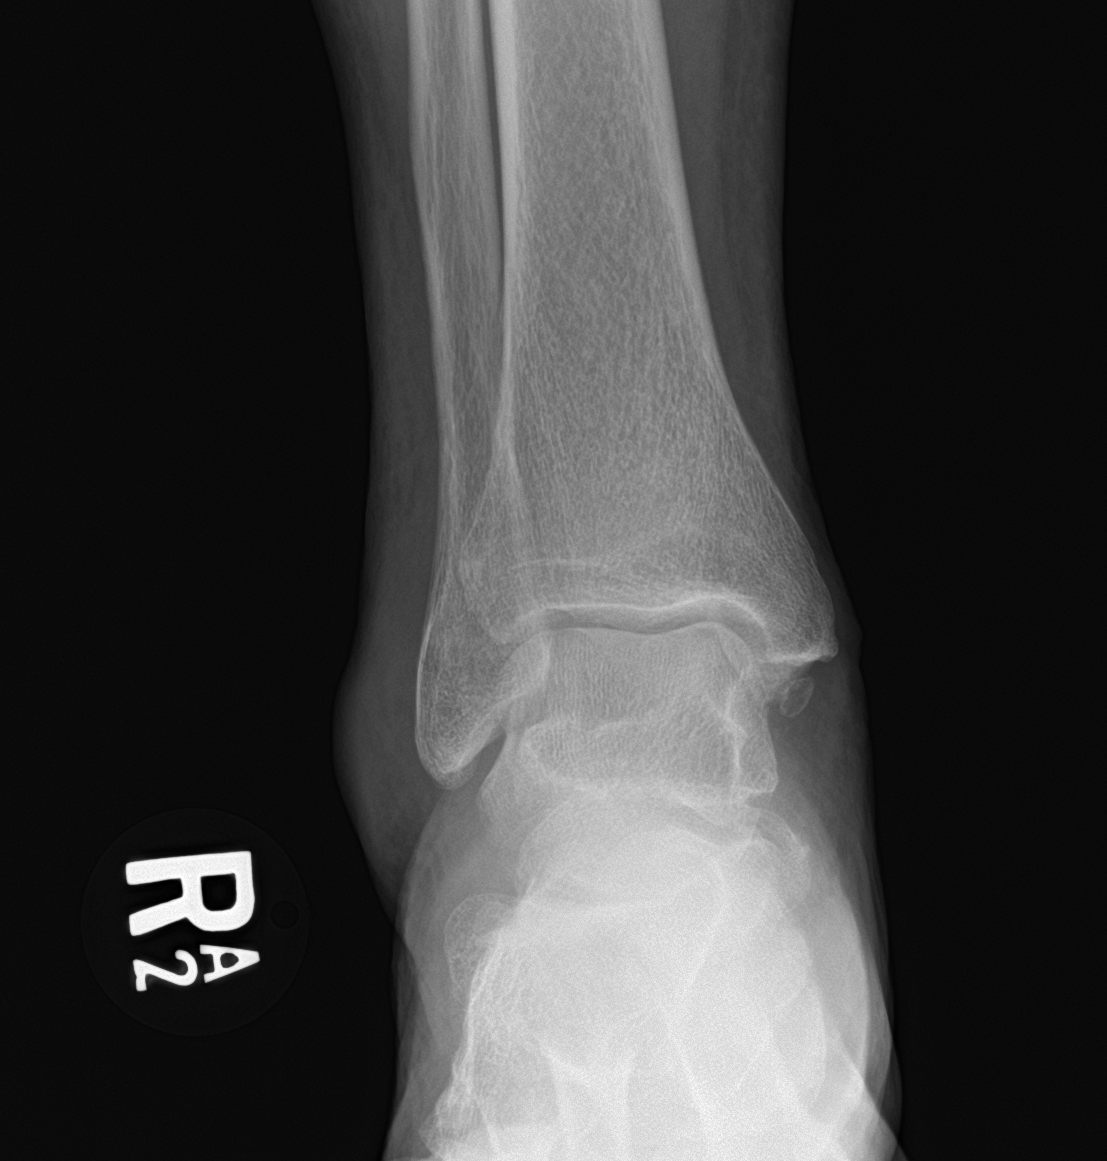

[ankle obl]
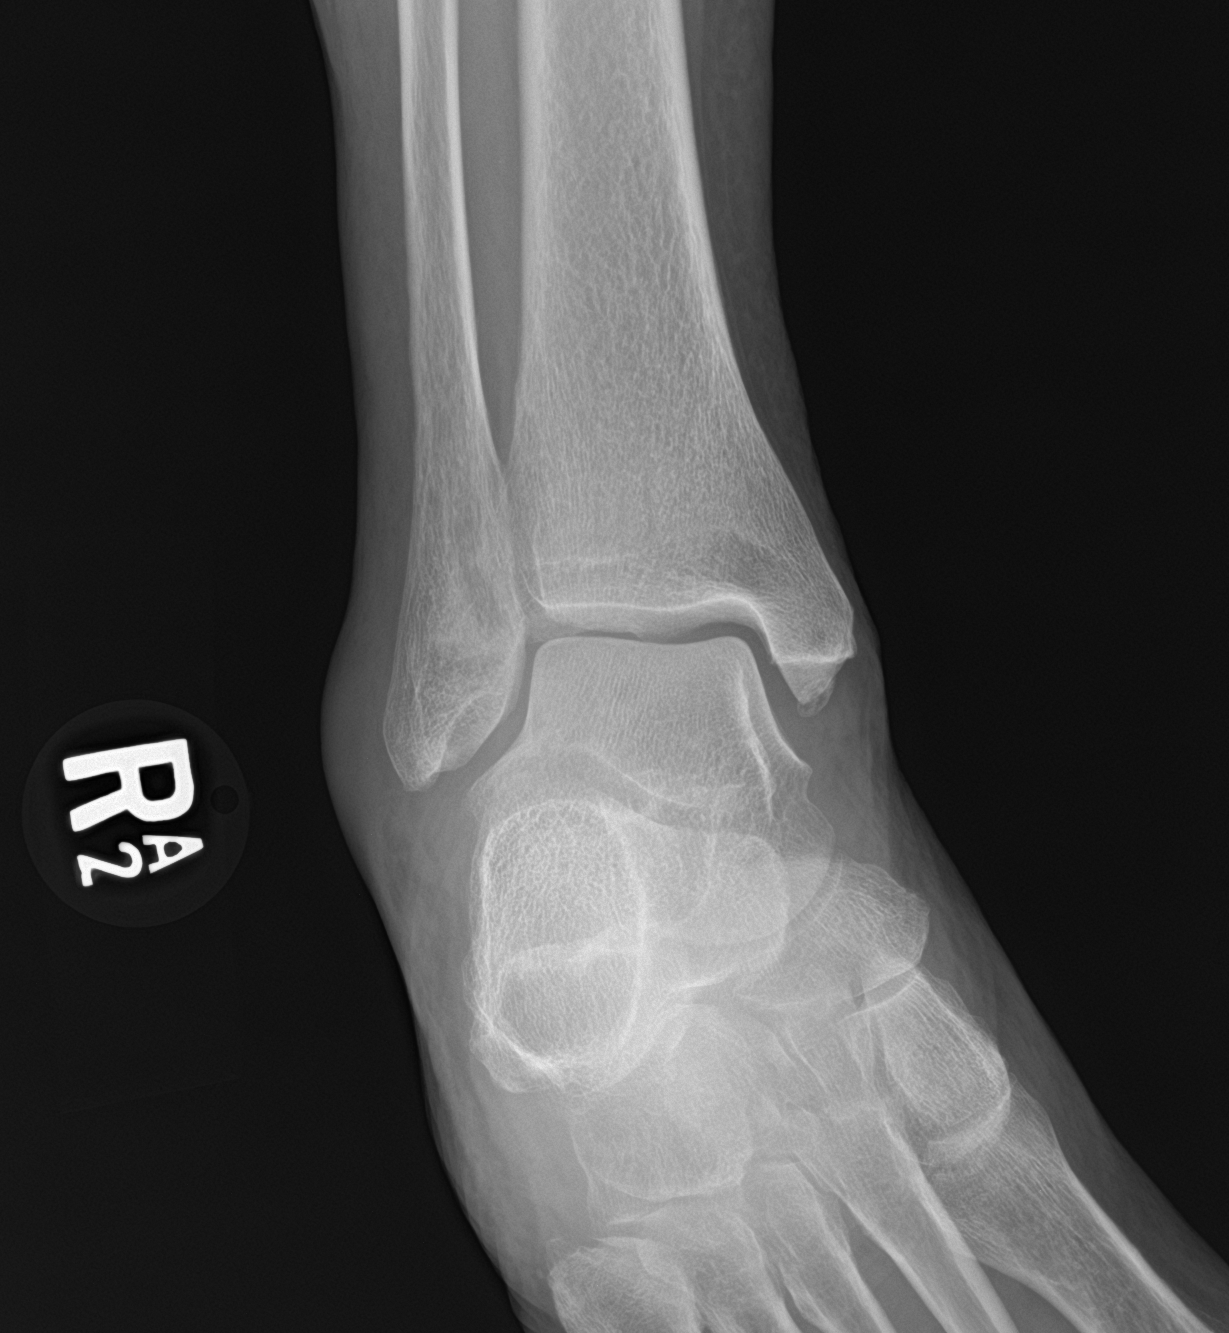

[ankle lat]
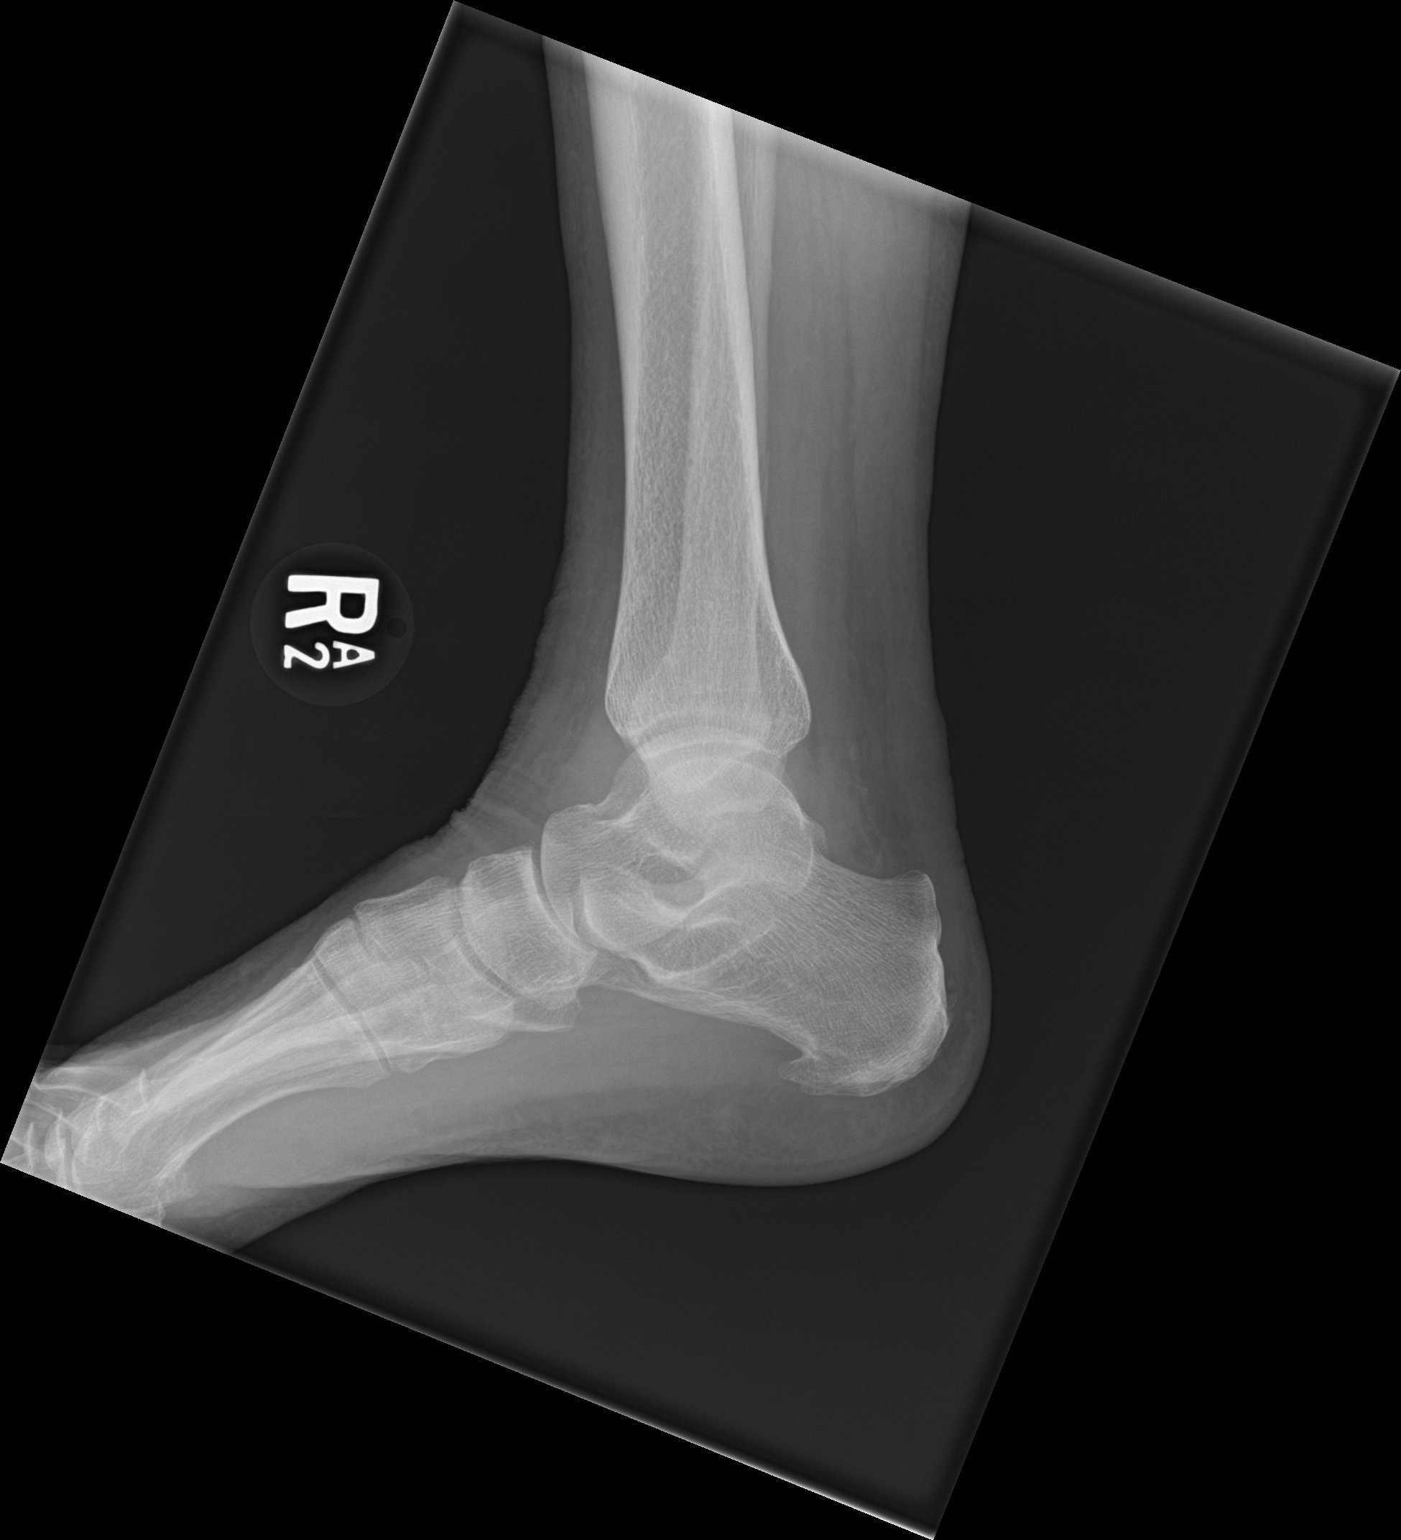

[3 of 3 positions shown; findings below may reference images not displayed]

FINDINGS: Moderate lateral right ankle soft tissue swelling. No fracture or
subluxation. No suspicious focal osseous lesion. Small plantar right
calcaneal spur. Small ossicle adjacent to the medial malleolus. No
radiopaque foreign body.
IMPRESSION: Moderate lateral right ankle soft tissue swelling, with no fracture
or subluxation.

## 2018-05-13 DIAGNOSIS — G894 Chronic pain syndrome: Secondary | ICD-10-CM | POA: Diagnosis not present

## 2018-05-13 DIAGNOSIS — F5101 Primary insomnia: Secondary | ICD-10-CM | POA: Diagnosis not present

## 2018-05-13 DIAGNOSIS — R682 Dry mouth, unspecified: Secondary | ICD-10-CM | POA: Diagnosis not present

## 2018-05-13 DIAGNOSIS — M069 Rheumatoid arthritis, unspecified: Secondary | ICD-10-CM | POA: Diagnosis not present

## 2018-05-13 DIAGNOSIS — M150 Primary generalized (osteo)arthritis: Secondary | ICD-10-CM | POA: Diagnosis not present

## 2018-05-20 DIAGNOSIS — I10 Essential (primary) hypertension: Secondary | ICD-10-CM | POA: Diagnosis not present

## 2018-05-20 DIAGNOSIS — E1169 Type 2 diabetes mellitus with other specified complication: Secondary | ICD-10-CM | POA: Diagnosis not present

## 2018-05-20 DIAGNOSIS — E039 Hypothyroidism, unspecified: Secondary | ICD-10-CM | POA: Diagnosis not present

## 2018-05-20 DIAGNOSIS — E782 Mixed hyperlipidemia: Secondary | ICD-10-CM | POA: Diagnosis not present

## 2018-05-20 DIAGNOSIS — N3000 Acute cystitis without hematuria: Secondary | ICD-10-CM | POA: Diagnosis not present

## 2018-08-23 ENCOUNTER — Encounter: Payer: Self-pay | Admitting: Cardiology

## 2018-08-23 ENCOUNTER — Telehealth (INDEPENDENT_AMBULATORY_CARE_PROVIDER_SITE_OTHER): Payer: Medicare Other | Admitting: Cardiology

## 2018-08-23 ENCOUNTER — Other Ambulatory Visit: Payer: Self-pay

## 2018-08-23 VITALS — BP 138/77 | HR 64 | Ht 63.0 in | Wt 194.0 lb

## 2018-08-23 DIAGNOSIS — I209 Angina pectoris, unspecified: Secondary | ICD-10-CM

## 2018-08-23 DIAGNOSIS — E119 Type 2 diabetes mellitus without complications: Secondary | ICD-10-CM

## 2018-08-23 DIAGNOSIS — Q245 Malformation of coronary vessels: Secondary | ICD-10-CM | POA: Diagnosis not present

## 2018-08-23 DIAGNOSIS — Z79899 Other long term (current) drug therapy: Secondary | ICD-10-CM

## 2018-08-23 DIAGNOSIS — I10 Essential (primary) hypertension: Secondary | ICD-10-CM

## 2018-08-23 DIAGNOSIS — E782 Mixed hyperlipidemia: Secondary | ICD-10-CM

## 2018-08-23 NOTE — Progress Notes (Signed)
Virtual Visit via Video Note   This visit type was conducted due to national recommendations for restrictions regarding the COVID-19 Pandemic (e.g. social distancing) in an effort to limit this patient's exposure and mitigate transmission in our community.  Due to her co-morbid illnesses, this patient is at least at moderate risk for complications without adequate follow up.  This format is felt to be most appropriate for this patient at this time.  All issues noted in this document were discussed and addressed.  A limited physical exam was performed with this format.  Please refer to the patient's chart for her consent to telehealth for [facility].   Evaluation Performed:  Follow-up visit  Date:  08/23/2018   ID:  Deborah Farley, DOB 1946-01-24, MRN [id number]  Patient Location: Home Provider Location: Home  PCP:  Abigail Miyamoto, MD  Cardiologist:  No primary care provider on file.  Electrophysiologist:  None   Chief Complaint:  Angina pectoris  History of Present Illness:    Deborah Farley is a 73 y.o. female with past medical history of myocardial bridge and angina pectoris.  Patient mentions to me that ever since Ranexa was initiated she is feeling very good.  She leads a sedentary lifestyle.  Her chest pain has completely resolved.  He denies any orthopnea or PND.  Unfortunately she leads a sedentary lifestyle.  At the time of my evaluation, the patient is alert awake oriented and in no distress.  The patient does not have symptoms concerning for COVID-19 infection (fever, chills, cough, or new shortness of breath).    Past Medical History:  Diagnosis Date  . Depression   . Diabetes mellitus without complication (HCC)   . Heart disease   . Hernia, hiatal   . High cholesterol   . Hypertension   . Hypothyroidism   . Memory difficulty   . Spastic esophagus    Past Surgical History:  Procedure Laterality Date  . ABDOMINAL HYSTERECTOMY    . BACK SURGERY    .  CARDIAC CATHETERIZATION N/A 08/12/2015   Procedure: Left Heart Cath and Coronary Angiography;  Surgeon: Yates Decamp, MD;  Location: [facility] INVASIVE CV LAB;  Service: Cardiovascular;  Laterality: N/A;  . LEFT HEART CATH AND CORONARY ANGIOGRAPHY N/A 02/20/2018   Procedure: LEFT HEART CATH AND CORONARY ANGIOGRAPHY;  Surgeon: Lennette Bihari, MD;  Location: [facility] INVASIVE CV LAB;  Service: Cardiovascular;  Laterality: N/A;  . TOTAL ABDOMINAL HYSTERECTOMY W/ BILATERAL SALPINGOOPHORECTOMY       Current Meds  Medication Sig  . ALPRAZolam (XANAX) 0.5 MG tablet Take 0.25 mg by mouth daily as needed for anxiety.  Marland Kitchen aspirin EC 81 MG tablet Take 81 mg by mouth daily.  . bumetanide (BUMEX) 1 MG tablet Take 1 mg by mouth daily as needed (swelling).   Marland Kitchen buPROPion (WELLBUTRIN XL) 300 MG 24 hr tablet Take 300 mg by mouth at bedtime.   . Cholecalciferol (VITAMIN D) 2000 units CAPS Take 2,000 Units by mouth daily.  Marland Kitchen diltiazem (CARDIZEM CD) 180 MG 24 hr capsule Take 1 capsule (180 mg total) by mouth daily.  . diphenhydrAMINE (BENADRYL) 25 MG tablet Take 50 mg by mouth at bedtime as needed. Take day of procedure   . enalapril (VASOTEC) 20 MG tablet Take 20 mg by mouth daily.  . folic acid (FOLVITE) 400 MCG tablet Take 400 mcg by mouth daily.  Marland Kitchen latanoprost (XALATAN) 0.005 % ophthalmic solution Place 1 drop into both eyes at bedtime.  Marland Kitchen levothyroxine (SYNTHROID,  LEVOTHROID) 25 MCG tablet Take 25 mcg by mouth daily before breakfast.  . methotrexate (RHEUMATREX) 2.5 MG tablet Take 3 tablets (7.5 mg total) by mouth once a week. (Patient taking differently: Take 25 mg by mouth every Sunday. )  . metoprolol tartrate (LOPRESSOR) 25 MG tablet Take 25 mg by mouth 2 (two) times daily.  . Multiple Vitamin (MULTIVITAMIN) capsule Take 1 capsule by mouth daily.  . nitroGLYCERIN (NITROSTAT) 0.4 MG SL tablet Place 0.4 mg under the tongue every 5 (five) minutes as needed for chest pain.   Marland Kitchen. omeprazole (PRILOSEC) 20 MG capsule Take 20 mg by  mouth 2 (two) times daily before a meal.  . polyethylene glycol (MIRALAX / GLYCOLAX) packet Take 17 g by mouth daily as needed for mild constipation.   . ranolazine (RANEXA) 1000 MG SR tablet Take 1 tablet (1,000 mg total) by mouth 2 (two) times daily.  . sertraline (ZOLOFT) 100 MG tablet Take 150 mg by mouth daily.   . traMADol (ULTRAM) 50 MG tablet Take one to two po q 6 hours prn pain (Patient taking differently: Take 50 mg by mouth every 6 (six) hours as needed for moderate pain. )  . traZODone (DESYREL) 50 MG tablet Take 1 tablet (50 mg total) by mouth at bedtime.  . Vitamin D, Ergocalciferol, (DRISDOL) 50000 units CAPS capsule Take 50,000 Units by mouth every Sunday.      Allergies:   Codeine; Iodine; and Shellfish allergy   Social History   Tobacco Use  . Smoking status: Never Smoker  . Smokeless tobacco: Never Used  Substance Use Topics  . Alcohol use: No  . Drug use: No     Family Hx: The patient's family history includes Heart disease in her father and mother.  ROS:   Please see the history of present illness.    No chest pain orthopnea or PND All other systems reviewed and are negative.   Prior CV studies:   The following studies were reviewed today:  None  Labs/Other Tests and Data Reviewed:    EKG:  No ECG reviewed.  Recent Labs: 03/20/2018: ALT 32; BUN 12; Creatinine, Ser 0.87; Potassium 4.4; Sodium 144   Recent Lipid Panel No results found for: CHOL, TRIG, HDL, CHOLHDL, LDLCALC, LDLDIRECT  Wt Readings from Last 3 Encounters:  08/23/18 194 lb (88 kg)  03/15/18 204 lb (92.5 kg)  02/20/18 204 lb (92.5 kg)     Objective:    Vital Signs:  BP 138/77 (BP Location: Left Arm, Patient Position: Sitting, Cuff Size: Normal)   Pulse 64   Ht 5\' 3"  (1.6 m)   Wt 194 lb (88 kg)   BMI 34.37 kg/m    VITAL SIGNS:  reviewed  ASSESSMENT & PLAN:    1. Angina pectoris secondary to myocardial bridge: Patient's chest pain has resolved completely and she is very  happy about it.  I reassured her about my findings today. 2. Essential hypertension: Her blood pressure is stable 3. Mixed dyslipidemia: Lipids are followed by primary care physician.  Diet was discussed.  Importance of compliance with exercise and diet stressed extensively and she vocalized understanding.  I urged her to lose weight.  Risks of obesity explained. 4. Patient will be seen in follow-up appointment in 6 months or earlier if the patient has any concerns   COVID-19 Education: The signs and symptoms of COVID-19 were discussed with the patient and how to seek care for testing (follow up with PCP or arrange E-visit).  The  importance of social distancing was discussed today.  Time:   Today, I have spent 17 minutes with the patient with telehealth technology discussing the above problems.     Medication Adjustments/Labs and Tests Ordered: Current medicines are reviewed at length with the patient today.  Concerns regarding medicines are outlined above.   Tests Ordered: No orders of the defined types were placed in this encounter.   Medication Changes: No orders of the defined types were placed in this encounter.   Disposition:  Follow up in 6 month(s)  Signed, Garwin Brothersajan R Revankar, MD  08/23/2018 2:59 PM     [facility]

## 2018-08-23 NOTE — Patient Instructions (Signed)
Medication Instructions:  Your physician recommends that you continue on your current medications as directed. Please refer to the Current Medication list given to you today.  If you need a refill on your cardiac medications before your next appointment, please call your pharmacy.   Lab work: None.  If you have labs (blood work) drawn today and your tests are completely normal, you will receive your results only by: . MyChart Message (if you have MyChart) OR . A paper copy in the mail If you have any lab test that is abnormal or we need to change your treatment, we will call you to review the results.  Testing/Procedures: None.   Follow-Up: At [facility], you and your health needs are our priority.  As part of our continuing mission to provide you with exceptional heart care, we have created designated Provider Care Teams.  These Care Teams include your primary Cardiologist (physician) and Advanced Practice Providers (APPs -  Physician Assistants and Nurse Practitioners) who all work together to provide you with the care you need, when you need it. You will need a follow up appointment in 6 months.     

## 2018-08-26 DIAGNOSIS — F5101 Primary insomnia: Secondary | ICD-10-CM | POA: Diagnosis not present

## 2018-08-26 DIAGNOSIS — M069 Rheumatoid arthritis, unspecified: Secondary | ICD-10-CM | POA: Diagnosis not present

## 2018-08-26 DIAGNOSIS — G894 Chronic pain syndrome: Secondary | ICD-10-CM | POA: Diagnosis not present

## 2018-08-26 DIAGNOSIS — M150 Primary generalized (osteo)arthritis: Secondary | ICD-10-CM | POA: Diagnosis not present

## 2018-08-26 DIAGNOSIS — R682 Dry mouth, unspecified: Secondary | ICD-10-CM | POA: Diagnosis not present

## 2018-09-09 DIAGNOSIS — Z1159 Encounter for screening for other viral diseases: Secondary | ICD-10-CM | POA: Diagnosis not present

## 2018-10-15 ENCOUNTER — Other Ambulatory Visit: Payer: Self-pay | Admitting: Cardiology

## 2018-12-13 DIAGNOSIS — M069 Rheumatoid arthritis, unspecified: Secondary | ICD-10-CM | POA: Diagnosis not present

## 2018-12-16 DIAGNOSIS — M150 Primary generalized (osteo)arthritis: Secondary | ICD-10-CM | POA: Diagnosis not present

## 2018-12-16 DIAGNOSIS — G894 Chronic pain syndrome: Secondary | ICD-10-CM | POA: Diagnosis not present

## 2018-12-16 DIAGNOSIS — M069 Rheumatoid arthritis, unspecified: Secondary | ICD-10-CM | POA: Diagnosis not present

## 2019-01-14 DIAGNOSIS — I10 Essential (primary) hypertension: Secondary | ICD-10-CM | POA: Diagnosis not present

## 2019-01-14 DIAGNOSIS — Z1159 Encounter for screening for other viral diseases: Secondary | ICD-10-CM | POA: Diagnosis not present

## 2019-01-14 DIAGNOSIS — R0789 Other chest pain: Secondary | ICD-10-CM | POA: Diagnosis not present

## 2019-01-14 DIAGNOSIS — W108XXA Fall (on) (from) other stairs and steps, initial encounter: Secondary | ICD-10-CM | POA: Diagnosis not present

## 2019-01-14 DIAGNOSIS — M25561 Pain in right knee: Secondary | ICD-10-CM | POA: Diagnosis not present

## 2019-01-16 ENCOUNTER — Encounter: Payer: Self-pay | Admitting: Cardiology

## 2019-01-16 ENCOUNTER — Other Ambulatory Visit: Payer: Self-pay

## 2019-01-16 ENCOUNTER — Ambulatory Visit (INDEPENDENT_AMBULATORY_CARE_PROVIDER_SITE_OTHER): Payer: Medicare Other | Admitting: Cardiology

## 2019-01-16 VITALS — BP 120/80 | HR 70 | Ht 63.0 in | Wt 225.0 lb

## 2019-01-16 DIAGNOSIS — M0609 Rheumatoid arthritis without rheumatoid factor, multiple sites: Secondary | ICD-10-CM | POA: Diagnosis not present

## 2019-01-16 DIAGNOSIS — R0600 Dyspnea, unspecified: Secondary | ICD-10-CM

## 2019-01-16 DIAGNOSIS — R0789 Other chest pain: Secondary | ICD-10-CM | POA: Diagnosis not present

## 2019-01-16 DIAGNOSIS — Q245 Malformation of coronary vessels: Secondary | ICD-10-CM | POA: Diagnosis not present

## 2019-01-16 DIAGNOSIS — I10 Essential (primary) hypertension: Secondary | ICD-10-CM | POA: Diagnosis not present

## 2019-01-16 DIAGNOSIS — R0609 Other forms of dyspnea: Secondary | ICD-10-CM | POA: Diagnosis not present

## 2019-01-16 DIAGNOSIS — R0781 Pleurodynia: Secondary | ICD-10-CM | POA: Diagnosis not present

## 2019-01-16 DIAGNOSIS — S299XXA Unspecified injury of thorax, initial encounter: Secondary | ICD-10-CM | POA: Diagnosis not present

## 2019-01-16 HISTORY — DX: Other forms of dyspnea: R06.09

## 2019-01-16 HISTORY — DX: Dyspnea, unspecified: R06.00

## 2019-01-16 MED ORDER — DILTIAZEM HCL ER COATED BEADS 120 MG PO CP24: 120.0000 mg | ORAL_CAPSULE | Freq: Every day | ORAL | 1 refills | Status: DC

## 2019-01-16 NOTE — Addendum Note (Signed)
Addended by: Ashok Norris on: 01/16/2019 02:13 PM   Modules accepted: Orders

## 2019-01-16 NOTE — Patient Instructions (Signed)
Medication Instructions:  Your physician has recommended you make the following change in your medication:  DECREASE: Cardizem to 120 mg daily   If you need a refill on your cardiac medications before your next appointment, please call your pharmacy.   Lab work: None.  If you have labs (blood work) drawn today and your tests are completely normal, you will receive your results only by: Marland Kitchen MyChart Message (if you have MyChart) OR . A paper copy in the mail If you have any lab test that is abnormal or we need to change your treatment, we will call you to review the results.  Testing/Procedures: Your physician has requested that you have an echocardiogram. Echocardiography is a painless test that uses sound waves to create images of your heart. It provides your doctor with information about the size and shape of your heart and how well your heart's chambers and valves are working. This procedure takes approximately one hour. There are no restrictions for this procedure.  You will have a VQ scan.    Follow-Up: At [facility], you and your health needs are our priority.  As part of our continuing mission to provide you with exceptional heart care, we have created designated Provider Care Teams.  These Care Teams include your primary Cardiologist (physician) and Advanced Practice Providers (APPs -  Physician Assistants and Nurse Practitioners) who all work together to provide you with the care you need, when you need it. You will need a follow up appointment in 3 weeks.  Please call our office 2 months in advance to schedule this appointment.  You may see No primary care provider on file. or another member of our [employer] Provider Team in [place]: Norman Herrlich, MD . Belva Crome, MD  Any Other Special Instructions Will Be Listed Below (If Applicable).   Ventilation-Perfusion Scan  A ventilation-perfusion scan is a procedure to look at airflow and blood flow in the lungs. It is  most often done to look for a blood clot that may have traveled to the lungs (pulmonary embolus). During this scan, radioactive compounds are injected into the bloodstream and are also breathed in. The compounds are not harmful. They are given at very low doses and stay in the body for a short time. After the compounds are injected and breathed in, a camera is used to scan the lungs. Tell a health care provider about:  Any allergies you have.  All medicines you are taking, including vitamins, herbs, eye drops, creams, and over-the-counter medicines.  Any blood disorders you have.  Any surgeries you have had.  Any medical conditions you have.  Whether you are pregnant or may be pregnant.  Whether or not you are breastfeeding. What are the risks? Generally, this is a safe procedure. However, problems may occur, including an allergic reaction to the radioactive compounds. What happens before the procedure?  Do not use any products that contain nicotine or tobacco, such as cigarettes and e-cigarettes. If you need help quitting, ask your health care provider.  Take over-the-counter and prescription medicines only as told by your health care provider. What happens during the procedure?  An IV will be inserted into one of your veins. The IV will stay in place for the entire exam.  A small amount of radioactive material will be injected into your bloodstream.  Your lungs will be scanned with a camera. This camera will record the images.  You will be asked to inhale a second radioactive compound. Take deep  breaths as directed by the health care provider.  Your lungs will be scanned again.  Your IV will be removed. The procedure may vary among health care providers and hospitals. What happens after the procedure?  You may go home unless your health care provider instructs you differently.  You may continue with your normal activities and diet as instructed by your health care  provider.  It is up to you to get the results of your procedure. Ask your health care provider, or the department that is doing the procedure, when your results will be ready. Summary  A ventilation-perfusion scan is a scan to look at airflow and blood flow in the lungs. It is most often done to look for blood clots that may have traveled to the lungs.  The radioactive compounds are not harmful.  Your lungs will be scanned with a camera. This camera will record the images.  It is up to you to get the results of your procedure. Ask your health care provider, or the department that is doing the procedure, when your results will be ready. This information is not intended to replace advice given to you by your health care provider. Make sure you discuss any questions you have with your health care provider. Document Released: 04/21/2000 Document Revised: 04/06/2017 Document Reviewed: 06/01/2016 Elsevier Patient Education  2020 [employer].  Echocardiogram An echocardiogram is a procedure that uses painless sound waves (ultrasound) to produce an image of the heart. Images from an echocardiogram can provide important information about:  Signs of coronary artery disease (CAD).  Aneurysm detection. An aneurysm is a weak or damaged part of an artery wall that bulges out from the normal force of blood pumping through the body.  Heart size and shape. Changes in the size or shape of the heart can be associated with certain conditions, including heart failure, aneurysm, and CAD.  Heart muscle function.  Heart valve function.  Signs of a past heart attack.  Fluid buildup around the heart.  Thickening of the heart muscle.  A tumor or infectious growth around the heart valves. Tell a health care provider about:  Any allergies you have.  All medicines you are taking, including vitamins, herbs, eye drops, creams, and over-the-counter medicines.  Any blood disorders you have.  Any  surgeries you have had.  Any medical conditions you have.  Whether you are pregnant or may be pregnant. What are the risks? Generally, this is a safe procedure. However, problems may occur, including:  Allergic reaction to dye (contrast) that may be used during the procedure. What happens before the procedure? No specific preparation is needed. You may eat and drink normally. What happens during the procedure?   An IV tube may be inserted into one of your veins.  You may receive contrast through this tube. A contrast is an injection that improves the quality of the pictures from your heart.  A gel will be applied to your chest.  A wand-like tool (transducer) will be moved over your chest. The gel will help to transmit the sound waves from the transducer.  The sound waves will harmlessly bounce off of your heart to allow the heart images to be captured in real-time motion. The images will be recorded on a computer. The procedure may vary among health care providers and hospitals. What happens after the procedure?  You may return to your normal, everyday life, including diet, activities, and medicines, unless your health care provider tells you not to do  that. Summary  An echocardiogram is a procedure that uses painless sound waves (ultrasound) to produce an image of the heart.  Images from an echocardiogram can provide important information about the size and shape of your heart, heart muscle function, heart valve function, and fluid buildup around your heart.  You do not need to do anything to prepare before this procedure. You may eat and drink normally.  After the echocardiogram is completed, you may return to your normal, everyday life, unless your health care provider tells you not to do that. This information is not intended to replace advice given to you by your health care provider. Make sure you discuss any questions you have with your health care provider. Document  Released: 04/21/2000 Document Revised: 08/15/2018 Document Reviewed: 05/27/2016 Elsevier Patient Education  2020 [employer].

## 2019-01-16 NOTE — Progress Notes (Signed)
Cardiology Office Note:    Date:  01/16/2019   ID:  Deborah Farley, DOB 1945/06/11, MRN [id number]  PCP:  Abigail MiyamotoPerry, Lawrence Edward, MD  Cardiologist:  Gypsy Balsamobert Krasowski, MD    Referring MD: Abigail MiyamotoPerry, Lawrence Edward,*   Chief Complaint  Patient presents with  . Shortness of Breath  . Chest Pain    Patient fell 2 weeks ago and hurt her ribs  . Follow-up    History of Present Illness:    Deborah Farley is a 73 y.o. female with history of coronary artery disease, LAD myocardial bridging, successfully managed with medications.  Also history of diabetes hypertension.  She comes today to my office because 2 weeks ago she fell down she does have 3 falls in her last few months.  She said usually her legs give up her legs are very weak and sometimes especially when she walks down stairs to give up when she fell down and this is exactly what happened she injured her right side of her chest as well as her leg.  Initially she started recovering quite nicely but then she started having more chest pain more pleuritic pain as well as some shortness of breath.  She went to her primary care physician today.  She was sent to have a chest x-ray.  Not short of breath while talking to me but she said that he clearly she started getting better initially and then started getting worse.  My concern is possibility of pulmonary emboli.  On top of that another concern is the fact that her blood pressure sometimes very low she check her blood pressure by doing a wrist blood pressure monitor I told her this is inappropriate she should do duodenal shoulder.  She will change her monitor.  Past Medical History:  Diagnosis Date  . Depression   . Diabetes mellitus without complication (HCC)   . Heart disease   . Hernia, hiatal   . High cholesterol   . Hypertension   . Hypothyroidism   . Memory difficulty   . Spastic esophagus     Past Surgical History:  Procedure Laterality Date  . ABDOMINAL HYSTERECTOMY    . BACK  SURGERY    . CARDIAC CATHETERIZATION N/A 08/12/2015   Procedure: Left Heart Cath and Coronary Angiography;  Surgeon: Yates DecampJay Ganji, MD;  [facility];  Service: Cardiovascular;  Laterality: N/A;  . LEFT HEART CATH AND CORONARY ANGIOGRAPHY N/A 02/20/2018   Procedure: LEFT HEART CATH AND CORONARY ANGIOGRAPHY;  Surgeon: Lennette BihariKelly, Thomas A, MD;  Location: [facility] INVASIVE CV LAB;  Service: Cardiovascular;  Laterality: N/A;  . TOTAL ABDOMINAL HYSTERECTOMY W/ BILATERAL SALPINGOOPHORECTOMY      Current Medications: Current Meds  Medication Sig  . ALPRAZolam (XANAX) 0.5 MG tablet Take 0.25 mg by mouth daily as needed for anxiety.  Marland Kitchen. aspirin EC 81 MG tablet Take 81 mg by mouth daily.  . bumetanide (BUMEX) 1 MG tablet Take 1 mg by mouth daily as needed (swelling).   Marland Kitchen. buPROPion (WELLBUTRIN XL) 300 MG 24 hr tablet Take 300 mg by mouth at bedtime.   . Cholecalciferol (VITAMIN D) 2000 units CAPS Take 2,000 Units by mouth daily.  Marland Kitchen. diltiazem (CARDIZEM CD) 180 MG 24 hr capsule Take 1 capsule (180 mg total) by mouth daily.  . diphenhydrAMINE (BENADRYL) 25 MG tablet Take 50 mg by mouth at bedtime as needed. Take day of procedure   . enalapril (VASOTEC) 20 MG tablet Take 20 mg by mouth daily.  . folic  acid (FOLVITE) 400 MCG tablet Take 400 mcg by mouth daily.  Marland Kitchen latanoprost (XALATAN) 0.005 % ophthalmic solution Place 1 drop into both eyes at bedtime.  Marland Kitchen levothyroxine (SYNTHROID, LEVOTHROID) 25 MCG tablet Take 25 mcg by mouth daily before breakfast.  . methotrexate (RHEUMATREX) 2.5 MG tablet Take 3 tablets (7.5 mg total) by mouth once a week. (Patient taking differently: Take 10 mg by mouth every Sunday. )  . metoprolol tartrate (LOPRESSOR) 25 MG tablet Take 25 mg by mouth 2 (two) times daily.  . Multiple Vitamin (MULTIVITAMIN) capsule Take 1 capsule by mouth daily.  . nitroGLYCERIN (NITROSTAT) 0.4 MG SL tablet Place 0.4 mg under the tongue every 5 (five) minutes as needed for chest pain.   Marland Kitchen omeprazole  (PRILOSEC) 20 MG capsule Take 20 mg by mouth 2 (two) times daily before a meal.  . polyethylene glycol (MIRALAX / GLYCOLAX) packet Take 17 g by mouth daily as needed for mild constipation.   . pravastatin (PRAVACHOL) 20 MG tablet TAKE 1 TABLET(20 MG) BY MOUTH EVERY EVENING  . ranolazine (RANEXA) 1000 MG SR tablet Take 1 tablet (1,000 mg total) by mouth 2 (two) times daily.  . sertraline (ZOLOFT) 100 MG tablet Take 150 mg by mouth daily.   . traMADol (ULTRAM) 50 MG tablet Take one to two po q 6 hours prn pain (Patient taking differently: Take 50 mg by mouth every 6 (six) hours as needed for moderate pain. )  . traZODone (DESYREL) 50 MG tablet Take 1 tablet (50 mg total) by mouth at bedtime.  . Vitamin D, Ergocalciferol, (DRISDOL) 50000 units CAPS capsule Take 50,000 Units by mouth every Sunday.      Allergies:   Codeine, Iodine, and Shellfish allergy   Social History   Socioeconomic History  . Marital status: Married    Spouse name: Not on file  . Number of children: Not on file  . Years of education: Not on file  . Highest education level: Not on file  Occupational History  . Not on file  Social Needs  . Financial resource strain: Not on file  . Food insecurity    Worry: Not on file    Inability: Not on file  . Transportation needs    Medical: Not on file    Non-medical: Not on file  Tobacco Use  . Smoking status: Never Smoker  . Smokeless tobacco: Never Used  Substance and Sexual Activity  . Alcohol use: No  . Drug use: No  . Sexual activity: Not on file  Lifestyle  . Physical activity    Days per week: Not on file    Minutes per session: Not on file  . Stress: Not on file  Relationships  . Social Herbalist on phone: Not on file    Gets together: Not on file    Attends religious service: Not on file    Active member of club or organization: Not on file    Attends meetings of clubs or organizations: Not on file    Relationship status: Not on file  Other  Topics Concern  . Not on file  Social History Narrative  . Not on file     Family History: The patient's family history includes Heart disease in her father and mother. ROS:   Please see the history of present illness.    All 14 point review of systems negative except as described per history of present illness  EKGs/Labs/Other Studies Reviewed:  Recent Labs: 03/20/2018: ALT 32; BUN 12; Creatinine, Ser 0.87; Potassium 4.4; Sodium 144  Recent Lipid Panel No results found for: CHOL, TRIG, HDL, CHOLHDL, VLDL, LDLCALC, LDLDIRECT  Physical Exam:    VS:  BP 120/80   Pulse 70   Ht 5\' 3"  (1.6 m)   Wt 225 lb (102.1 kg)   SpO2 98%   BMI 39.86 kg/m     Wt Readings from Last 3 Encounters:  01/16/19 225 lb (102.1 kg)  08/23/18 194 lb (88 kg)  03/15/18 204 lb (92.5 kg)     GEN:  Well nourished, well developed in no acute distress HEENT: Normal NECK: No JVD; No carotid bruits LYMPHATICS: No lymphadenopathy CARDIAC: RRR, no murmurs, no rubs, no gallops RESPIRATORY:  Clear to auscultation without rales, wheezing or rhonchi  ABDOMEN: Soft, non-tender, non-distended MUSCULOSKELETAL:  No edema; No deformity  SKIN: Warm and dry LOWER EXTREMITIES: no swelling NEUROLOGIC:  Alert and oriented x 3 PSYCHIATRIC:  Normal affect   ASSESSMENT:    1. Essential hypertension   2. Coronary-myocardial bridge   3. Rheumatoid arthritis of multiple sites with negative rheumatoid factor (HCC)   4. Dyspnea on exertion    PLAN:    In order of problems listed above:  1. Essential hypertension now with facing different situation blood pressure being low I will reduce her Cardizem from 1 80-1 20.  I warned her about possible to 5 more angina like pain if dislocation need to let 13/08/19 know.  She can also take beta-blocker as well as Bumex.  However she takes Bumex only very rarely.  I will schedule her to have echocardiogram to check her systolic as well as diastolic function. 2. Coronary artery  disease with myocardial bridging plan as outlined above we will reduce slightly calcium channel blocker to help with the blood pressure. 3. Rheumatoid arthritis stable 4. Dyspnea on exertion after recent trauma.  I will schedule her to have either VQ scan or CT angios to rule out pulmonary emboli.  She does have some issue with dye allergy and therefore she need to be premedicated before the test.   Medication Adjustments/Labs and Tests Ordered: Current medicines are reviewed at length with the patient today.  Concerns regarding medicines are outlined above.  No orders of the defined types were placed in this encounter.  Medication changes: No orders of the defined types were placed in this encounter.   Signed, [place], MD, [facility] 01/16/2019 1:45 PM    [facility]

## 2019-01-17 DIAGNOSIS — R0602 Shortness of breath: Secondary | ICD-10-CM | POA: Diagnosis not present

## 2019-01-17 DIAGNOSIS — R0609 Other forms of dyspnea: Secondary | ICD-10-CM | POA: Diagnosis not present

## 2019-01-21 DIAGNOSIS — W108XXD Fall (on) (from) other stairs and steps, subsequent encounter: Secondary | ICD-10-CM | POA: Diagnosis not present

## 2019-01-21 DIAGNOSIS — M25561 Pain in right knee: Secondary | ICD-10-CM | POA: Diagnosis not present

## 2019-01-21 DIAGNOSIS — R0789 Other chest pain: Secondary | ICD-10-CM | POA: Diagnosis not present

## 2019-01-21 DIAGNOSIS — Z1159 Encounter for screening for other viral diseases: Secondary | ICD-10-CM | POA: Diagnosis not present

## 2019-01-28 DIAGNOSIS — Z1159 Encounter for screening for other viral diseases: Secondary | ICD-10-CM | POA: Diagnosis not present

## 2019-01-28 DIAGNOSIS — M25561 Pain in right knee: Secondary | ICD-10-CM | POA: Diagnosis not present

## 2019-01-28 DIAGNOSIS — W108XXD Fall (on) (from) other stairs and steps, subsequent encounter: Secondary | ICD-10-CM | POA: Diagnosis not present

## 2019-01-28 DIAGNOSIS — I10 Essential (primary) hypertension: Secondary | ICD-10-CM | POA: Diagnosis not present

## 2019-02-05 DIAGNOSIS — K5730 Diverticulosis of large intestine without perforation or abscess without bleeding: Secondary | ICD-10-CM | POA: Diagnosis not present

## 2019-02-05 DIAGNOSIS — R110 Nausea: Secondary | ICD-10-CM | POA: Diagnosis not present

## 2019-02-05 DIAGNOSIS — R531 Weakness: Secondary | ICD-10-CM | POA: Diagnosis not present

## 2019-02-05 DIAGNOSIS — N390 Urinary tract infection, site not specified: Secondary | ICD-10-CM | POA: Diagnosis not present

## 2019-02-05 DIAGNOSIS — Z888 Allergy status to other drugs, medicaments and biological substances status: Secondary | ICD-10-CM | POA: Diagnosis not present

## 2019-02-05 DIAGNOSIS — E119 Type 2 diabetes mellitus without complications: Secondary | ICD-10-CM | POA: Diagnosis not present

## 2019-02-05 DIAGNOSIS — R100 Acute abdomen: Secondary | ICD-10-CM | POA: Diagnosis not present

## 2019-02-05 DIAGNOSIS — J811 Chronic pulmonary edema: Secondary | ICD-10-CM | POA: Diagnosis not present

## 2019-02-05 DIAGNOSIS — K449 Diaphragmatic hernia without obstruction or gangrene: Secondary | ICD-10-CM | POA: Diagnosis not present

## 2019-02-05 DIAGNOSIS — Z885 Allergy status to narcotic agent status: Secondary | ICD-10-CM | POA: Diagnosis not present

## 2019-02-17 ENCOUNTER — Other Ambulatory Visit: Payer: Self-pay

## 2019-02-17 ENCOUNTER — Encounter: Payer: Self-pay | Admitting: Cardiology

## 2019-02-17 ENCOUNTER — Ambulatory Visit (INDEPENDENT_AMBULATORY_CARE_PROVIDER_SITE_OTHER): Payer: Medicare Other

## 2019-02-17 DIAGNOSIS — I10 Essential (primary) hypertension: Secondary | ICD-10-CM | POA: Diagnosis not present

## 2019-02-17 DIAGNOSIS — R0600 Dyspnea, unspecified: Secondary | ICD-10-CM

## 2019-02-17 NOTE — Progress Notes (Signed)
Complete echocardiogram has been performed.  Jimmy Raylen Tangonan RDCS, RVT 

## 2019-03-04 ENCOUNTER — Ambulatory Visit: Payer: Medicare Other | Admitting: Cardiology

## 2019-03-06 DIAGNOSIS — I10 Essential (primary) hypertension: Secondary | ICD-10-CM | POA: Diagnosis not present

## 2019-03-06 DIAGNOSIS — R0600 Dyspnea, unspecified: Secondary | ICD-10-CM | POA: Diagnosis not present

## 2019-03-06 DIAGNOSIS — I2589 Other forms of chronic ischemic heart disease: Secondary | ICD-10-CM | POA: Diagnosis not present

## 2019-03-06 DIAGNOSIS — R079 Chest pain, unspecified: Secondary | ICD-10-CM | POA: Diagnosis not present

## 2019-03-06 DIAGNOSIS — E785 Hyperlipidemia, unspecified: Secondary | ICD-10-CM | POA: Diagnosis not present

## 2019-03-06 DIAGNOSIS — N390 Urinary tract infection, site not specified: Secondary | ICD-10-CM | POA: Diagnosis not present

## 2019-03-06 DIAGNOSIS — R42 Dizziness and giddiness: Secondary | ICD-10-CM | POA: Diagnosis not present

## 2019-03-06 DIAGNOSIS — R531 Weakness: Secondary | ICD-10-CM | POA: Diagnosis not present

## 2019-03-06 DIAGNOSIS — E039 Hypothyroidism, unspecified: Secondary | ICD-10-CM | POA: Diagnosis not present

## 2019-03-06 DIAGNOSIS — R0789 Other chest pain: Secondary | ICD-10-CM | POA: Diagnosis not present

## 2019-03-06 DIAGNOSIS — R509 Fever, unspecified: Secondary | ICD-10-CM | POA: Diagnosis not present

## 2019-03-06 DIAGNOSIS — R0602 Shortness of breath: Secondary | ICD-10-CM | POA: Diagnosis not present

## 2019-03-07 ENCOUNTER — Telehealth (INDEPENDENT_AMBULATORY_CARE_PROVIDER_SITE_OTHER): Payer: Medicare Other | Admitting: Cardiology

## 2019-03-07 ENCOUNTER — Other Ambulatory Visit: Payer: Self-pay

## 2019-03-07 VITALS — BP 117/63 | HR 63 | Ht 63.0 in | Wt 197.0 lb

## 2019-03-07 DIAGNOSIS — E669 Obesity, unspecified: Secondary | ICD-10-CM | POA: Diagnosis not present

## 2019-03-07 DIAGNOSIS — E119 Type 2 diabetes mellitus without complications: Secondary | ICD-10-CM | POA: Diagnosis not present

## 2019-03-07 DIAGNOSIS — M069 Rheumatoid arthritis, unspecified: Secondary | ICD-10-CM | POA: Diagnosis not present

## 2019-03-07 DIAGNOSIS — I509 Heart failure, unspecified: Secondary | ICD-10-CM | POA: Diagnosis not present

## 2019-03-07 DIAGNOSIS — E782 Mixed hyperlipidemia: Secondary | ICD-10-CM | POA: Diagnosis not present

## 2019-03-07 DIAGNOSIS — Q245 Malformation of coronary vessels: Secondary | ICD-10-CM

## 2019-03-07 DIAGNOSIS — R0789 Other chest pain: Secondary | ICD-10-CM | POA: Diagnosis not present

## 2019-03-07 DIAGNOSIS — I110 Hypertensive heart disease with heart failure: Secondary | ICD-10-CM | POA: Diagnosis not present

## 2019-03-07 DIAGNOSIS — K219 Gastro-esophageal reflux disease without esophagitis: Secondary | ICD-10-CM | POA: Diagnosis not present

## 2019-03-07 DIAGNOSIS — Z8774 Personal history of (corrected) congenital malformations of heart and circulatory system: Secondary | ICD-10-CM | POA: Diagnosis not present

## 2019-03-07 DIAGNOSIS — N390 Urinary tract infection, site not specified: Secondary | ICD-10-CM | POA: Diagnosis not present

## 2019-03-07 DIAGNOSIS — R8290 Unspecified abnormal findings in urine: Secondary | ICD-10-CM | POA: Diagnosis not present

## 2019-03-07 DIAGNOSIS — Z7982 Long term (current) use of aspirin: Secondary | ICD-10-CM | POA: Diagnosis not present

## 2019-03-07 DIAGNOSIS — R112 Nausea with vomiting, unspecified: Secondary | ICD-10-CM | POA: Diagnosis not present

## 2019-03-07 DIAGNOSIS — E785 Hyperlipidemia, unspecified: Secondary | ICD-10-CM | POA: Diagnosis not present

## 2019-03-07 DIAGNOSIS — E039 Hypothyroidism, unspecified: Secondary | ICD-10-CM | POA: Diagnosis not present

## 2019-03-07 DIAGNOSIS — G8929 Other chronic pain: Secondary | ICD-10-CM | POA: Diagnosis not present

## 2019-03-07 NOTE — Progress Notes (Signed)
Virtual Visit via Video Note   This visit type was conducted due to national recommendations for restrictions regarding the COVID-19 Pandemic (e.g. social distancing) in an effort to limit this patient's exposure and mitigate transmission in our community.  Due to her co-morbid illnesses, this patient is at least at moderate risk for complications without adequate follow up.  This format is felt to be most appropriate for this patient at this time.  All issues noted in this document were discussed and addressed.  A limited physical exam was performed with this format.  Please refer to the patient's chart for her consent to telehealth for [facility].   Date:  03/07/2019   ID:  Deborah Farley, DOB 18-Jul-1945, MRN [id number]  Patient Location: Home Provider Location: Office  PCP:  Deborah Miyamoto, MD  Cardiologist:  No primary care provider on file.  Electrophysiologist:  None   Evaluation Performed:  Follow-Up Visit  Chief Complaint: Dyspnea on exertion  History of Present Illness:    Deborah Farley is a 73 y.o. female with past medical history of essential hypertension, myocardial bridging.  She has been noticing shortness of breath on exertion.  She tells me that she went up [place] and went to the emergency room.  Subsequently she is in [place] and because of the same symptoms she also has found to be emergency room.  They have not given her any significant diagnosis.  She is being treated for urinary tract infection.  She appears to be comfortable at rest but is very concerned about her symptoms.  At the time of my evaluation, the patient is alert awake oriented and in no distress.  I reviewed records from previous visit with my partner and VQ scan revealed low probability of pulmonary embolism.  The patient does not have symptoms concerning for COVID-19 infection (fever, chills, cough, or new shortness of breath).    Past Medical History:  Diagnosis Date  . Depression   .  Diabetes mellitus without complication (HCC)   . Heart disease   . Hernia, hiatal   . High cholesterol   . Hypertension   . Hypothyroidism   . Memory difficulty   . Spastic esophagus    Past Surgical History:  Procedure Laterality Date  . ABDOMINAL HYSTERECTOMY    . BACK SURGERY    . CARDIAC CATHETERIZATION N/A 08/12/2015   Procedure: Left Heart Cath and Coronary Angiography;  Surgeon: Yates Decamp, MD;  Location: [facility] INVASIVE CV LAB;  Service: Cardiovascular;  Laterality: N/A;  . LEFT HEART CATH AND CORONARY ANGIOGRAPHY N/A 02/20/2018   Procedure: LEFT HEART CATH AND CORONARY ANGIOGRAPHY;  Surgeon: Lennette Bihari, MD;  Location: [facility] INVASIVE CV LAB;  Service: Cardiovascular;  Laterality: N/A;  . TOTAL ABDOMINAL HYSTERECTOMY W/ BILATERAL SALPINGOOPHORECTOMY       Current Meds  Medication Sig  . ALPRAZolam (XANAX) 0.5 MG tablet Take 0.25 mg by mouth daily as needed for anxiety.  Marland Kitchen aspirin EC 81 MG tablet Take 81 mg by mouth daily.  . bumetanide (BUMEX) 1 MG tablet Take 1 mg by mouth daily as needed (swelling).   Marland Kitchen buPROPion (WELLBUTRIN XL) 300 MG 24 hr tablet Take 300 mg by mouth at bedtime.   . Cholecalciferol (VITAMIN D) 2000 units CAPS Take 2,000 Units by mouth daily.  Marland Kitchen diltiazem (CARDIZEM CD) 120 MG 24 hr capsule Take 1 capsule (120 mg total) by mouth daily.  . diphenhydrAMINE (BENADRYL) 25 MG tablet Take 50 mg by mouth at bedtime as  needed. Take day of procedure   . enalapril (VASOTEC) 20 MG tablet Take 20 mg by mouth daily.  . folic acid (FOLVITE) 400 MCG tablet Take 400 mcg by mouth daily.  Marland Kitchen latanoprost (XALATAN) 0.005 % ophthalmic solution Place 1 drop into both eyes at bedtime.  Marland Kitchen levothyroxine (SYNTHROID, LEVOTHROID) 25 MCG tablet Take 25 mcg by mouth daily before breakfast.  . methotrexate (RHEUMATREX) 2.5 MG tablet Take 3 tablets (7.5 mg total) by mouth once a week. (Patient taking differently: Take 10 mg by mouth every Sunday. )  . metoprolol tartrate (LOPRESSOR) 25 MG  tablet Take 25 mg by mouth 2 (two) times daily.  . Multiple Vitamin (MULTIVITAMIN) capsule Take 1 capsule by mouth daily.  . nitroGLYCERIN (NITROSTAT) 0.4 MG SL tablet Place 0.4 mg under the tongue every 5 (five) minutes as needed for chest pain.   Marland Kitchen omeprazole (PRILOSEC) 20 MG capsule Take 20 mg by mouth 2 (two) times daily before a meal.  . polyethylene glycol (MIRALAX / GLYCOLAX) packet Take 17 g by mouth daily as needed for mild constipation.   . pravastatin (PRAVACHOL) 20 MG tablet TAKE 1 TABLET(20 MG) BY MOUTH EVERY EVENING  . ranolazine (RANEXA) 1000 MG SR tablet Take 1 tablet (1,000 mg total) by mouth 2 (two) times daily.  . sertraline (ZOLOFT) 100 MG tablet Take 150 mg by mouth daily.   . traMADol (ULTRAM) 50 MG tablet Take one to two po q 6 hours prn pain (Patient taking differently: Take 50 mg by mouth every 6 (six) hours as needed for moderate pain. )  . traZODone (DESYREL) 50 MG tablet Take 1 tablet (50 mg total) by mouth at bedtime.  . Vitamin D, Ergocalciferol, (DRISDOL) 50000 units CAPS capsule Take 50,000 Units by mouth every Sunday.      Allergies:   Codeine, Iodine, and Shellfish allergy   Social History   Tobacco Use  . Smoking status: Never Smoker  . Smokeless tobacco: Never Used  Substance Use Topics  . Alcohol use: No  . Drug use: No     Family Hx: The patient's family history includes Heart disease in her father and mother.  ROS:   Please see the history of present illness.    As mentioned above All other systems reviewed and are negative.   Prior CV studies:   The following studies were reviewed today:  I reviewed the VQ scan report with the patient  Labs/Other Tests and Data Reviewed:    EKG:  No ECG reviewed.  Recent Labs: 03/20/2018: ALT 32; BUN 12; Creatinine, Ser 0.87; Potassium 4.4; Sodium 144   Recent Lipid Panel No results found for: CHOL, TRIG, HDL, CHOLHDL, LDLCALC, LDLDIRECT  Wt Readings from Last 3 Encounters:  03/07/19 197 lb  (89.4 kg)  01/16/19 225 lb (102.1 kg)  08/23/18 194 lb (88 kg)     Objective:    Vital Signs:  BP 117/63 (BP Location: Left Arm, Patient Position: Sitting, Cuff Size: Normal)   Pulse 63   Ht 5\' 3"  (1.6 m)   Wt 197 lb (89.4 kg)   BMI 34.90 kg/m    VITAL SIGNS:  reviewed  ASSESSMENT & PLAN:    1. Dyspnea on exertion: Unclear what is relating to her symptomatology.  She tells me that she is found to emergency rooms and I told her when she comes back from [place] in the next week or 2 to make an appointment with me.  I told her to get copies of her  records "reviewed. 2. Essential hypertension: She mentions to me that her blood pressure is stable 3. Myocardial bridging: Symptoms appear not to be coming from this issue.  She does not have any symptoms suggesting angina. 4. She will be seen in the follow-up weeks when she comes back from [place] for further evaluation if necessary.  She understands this.  COVID-19 Education: The signs and symptoms of COVID-19 were discussed with the patient and how to seek care for testing (follow up with PCP or arrange E-visit).  The importance of social distancing was discussed today.  Time:   Today, I have spent 15 minutes with the patient with telehealth technology discussing the above problems.     Medication Adjustments/Labs and Tests Ordered: Current medicines are reviewed at length with the patient today.  Concerns regarding medicines are outlined above.   Tests Ordered: No orders of the defined types were placed in this encounter.   Medication Changes: No orders of the defined types were placed in this encounter.   Follow Up:  In Person in 1 month(s)  Signed, Jenean Lindau, MD  03/07/2019 11:00 AM    [place]

## 2019-03-07 NOTE — Patient Instructions (Signed)
Medication Instructions:  Your physician recommends that you continue on your current medications as directed. Please refer to the Current Medication list given to you today.  *If you need a refill on your cardiac medications before your next appointment, please call your pharmacy*  Lab Work: NONE If you have labs (blood work) drawn today and your tests are completely normal, you will receive your results only by: Marland Kitchen MyChart Message (if you have MyChart) OR . A paper copy in the mail If you have any lab test that is abnormal or we need to change your treatment, we will call you to review the results.  Testing/Procedures: NONE  Follow-Up: At [facility], you and your health needs are our priority.  As part of our continuing mission to provide you with exceptional heart care, we have created designated Provider Care Teams.  These Care Teams include your primary Cardiologist (physician) and Advanced Practice Providers (APPs -  Physician Assistants and Nurse Practitioners) who all work together to provide you with the care you need, when you need it.  Your next appointment:   1 week  The format for your next appointment:   In Person  Provider:   Jyl Heinz, MD

## 2019-03-08 DIAGNOSIS — I10 Essential (primary) hypertension: Secondary | ICD-10-CM | POA: Diagnosis not present

## 2019-03-08 DIAGNOSIS — N390 Urinary tract infection, site not specified: Secondary | ICD-10-CM | POA: Diagnosis not present

## 2019-03-08 DIAGNOSIS — R0789 Other chest pain: Secondary | ICD-10-CM | POA: Diagnosis not present

## 2019-03-08 DIAGNOSIS — E785 Hyperlipidemia, unspecified: Secondary | ICD-10-CM | POA: Diagnosis not present

## 2019-03-08 DIAGNOSIS — Q245 Malformation of coronary vessels: Secondary | ICD-10-CM | POA: Diagnosis not present

## 2019-03-08 DIAGNOSIS — M069 Rheumatoid arthritis, unspecified: Secondary | ICD-10-CM | POA: Diagnosis not present

## 2019-03-10 ENCOUNTER — Ambulatory Visit: Payer: Medicare Other | Admitting: Cardiology

## 2019-03-11 ENCOUNTER — Encounter: Payer: Self-pay | Admitting: Cardiology

## 2019-03-11 ENCOUNTER — Ambulatory Visit: Payer: Medicare Other | Admitting: Cardiology

## 2019-03-11 ENCOUNTER — Other Ambulatory Visit: Payer: Self-pay

## 2019-03-11 VITALS — BP 160/84 | HR 63 | Ht 63.0 in | Wt 224.0 lb

## 2019-03-11 DIAGNOSIS — E669 Obesity, unspecified: Secondary | ICD-10-CM | POA: Diagnosis not present

## 2019-03-11 DIAGNOSIS — R0989 Other specified symptoms and signs involving the circulatory and respiratory systems: Secondary | ICD-10-CM

## 2019-03-11 DIAGNOSIS — R0600 Dyspnea, unspecified: Secondary | ICD-10-CM | POA: Diagnosis not present

## 2019-03-11 DIAGNOSIS — Z8639 Personal history of other endocrine, nutritional and metabolic disease: Secondary | ICD-10-CM

## 2019-03-11 DIAGNOSIS — R0609 Other forms of dyspnea: Secondary | ICD-10-CM

## 2019-03-11 DIAGNOSIS — R0602 Shortness of breath: Secondary | ICD-10-CM

## 2019-03-11 DIAGNOSIS — I10 Essential (primary) hypertension: Secondary | ICD-10-CM

## 2019-03-11 DIAGNOSIS — E782 Mixed hyperlipidemia: Secondary | ICD-10-CM

## 2019-03-11 DIAGNOSIS — Q245 Malformation of coronary vessels: Secondary | ICD-10-CM

## 2019-03-11 HISTORY — DX: Other specified symptoms and signs involving the circulatory and respiratory systems: R09.89

## 2019-03-11 MED ORDER — PREDNISONE 50 MG PO TABS: ORAL_TABLET | ORAL | 0 refills | Status: DC

## 2019-03-11 MED ORDER — DIPHENHYDRAMINE HCL 50 MG PO TABS: ORAL_TABLET | ORAL | 0 refills | Status: DC

## 2019-03-11 NOTE — Progress Notes (Signed)
Cardiology Office Note:    Date:  03/11/2019   ID:  Deborah Farley, DOB January 07, 1946, MRN [id number]  PCP:  Lillard Anes, MD  Cardiologist:  Jenean Lindau, MD   Referring MD: Lillard Anes,*    ASSESSMENT:    1. Dyspnea on exertion   2. Shortness of breath   3. Suspected pulmonary embolism   4. Obesity (BMI 35.0-39.9 without comorbidity)   5. History of diabetes mellitus   6. Coronary-myocardial bridge   7. Mixed hyperlipidemia   8. Essential hypertension    PLAN:    In order of problems listed above:  1. Dyspnea on exertion: Her symptoms are very concerning.  In view of this I suspect that we need to assess her pulmonary status and also for possible potential of pulmonary thromboembolism.  I discussed this with her extensively and then she vocalized understanding.  She will have a Chem-7 today.  She has contrast allergy and we will premedicate her per protocol.  She will be scheduled for CT scan of the chest with contrast to rule out for pulmonary embolism. 2. Essential hypertension: Blood pressure stable 3. Obesity: Diet was discussed and weight reduction was stressed.  She knows to go to the nearest emergency room for any concerning symptoms.Patient will be seen in follow-up appointment in 3 months or earlier if the patient has any concerns    Medication Adjustments/Labs and Tests Ordered: Current medicines are reviewed at length with the patient today.  Concerns regarding medicines are outlined above.  Orders Placed This Encounter  Procedures  . CT ANGIO CHEST PE W OR WO CONTRAST  . Basic Metabolic Panel (BMET)  . EKG 12-Lead   Meds ordered this encounter  Medications  . DISCONTD: predniSONE (DELTASONE) 50 MG tablet    Sig: Prednisone 50 mg - take 13 hours prior to test Take another Prednisone 50 mg 7 hours prior to test Take another Prednisone 50 mg 1 hour prior to test    Dispense:  30 tablet    Refill:  0  . diphenhydrAMINE (BENADRYL) 50 MG  tablet    Sig: Take 1 tablet one hour prior to your procedure    Dispense:  30 tablet    Refill:  0  . DISCONTD: predniSONE (DELTASONE) 50 MG tablet    Sig: Prednisone 50 mg - take 13 hours prior to test Take another Prednisone 50 mg 7 hours prior to test Take another Prednisone 50 mg 1 hour prior to test    Dispense:  30 tablet    Refill:  0  . predniSONE (DELTASONE) 50 MG tablet    Sig: Prednisone 50 mg - take 13 hours prior to test Take another Prednisone 50 mg 7 hours prior to test Take another Prednisone 50 mg 1 hour prior to test    Dispense:  30 tablet    Refill:  0     Chief Complaint  Patient presents with  . Follow-up     History of Present Illness:    Deborah Farley is a 73 y.o. female.  Patient has past medical history of essential hypertension and myocardial bridging.  She mentions to me that shortness of breath on exertion has been causing concern to her.  This is very distressing to her because it has affected her quality of life and is happening recently.  She went to the hospital in [place] recently.  She brought me a copy of her reports and it reveals that she is had a CT  of the abdomen which was unremarkable.  Chest x-ray was unremarkable.  She continues to experience these disabling symptoms.  Is a fairly recent.  At the time of my evaluation, the patient is alert awake oriented and in no distress.  Past Medical History:  Diagnosis Date  . Depression   . Diabetes mellitus without complication (HCC)   . Heart disease   . Hernia, hiatal   . High cholesterol   . Hypertension   . Hypothyroidism   . Memory difficulty   . Spastic esophagus     Past Surgical History:  Procedure Laterality Date  . ABDOMINAL HYSTERECTOMY    . BACK SURGERY    . CARDIAC CATHETERIZATION N/A 08/12/2015   Procedure: Left Heart Cath and Coronary Angiography;  Surgeon: Yates Decamp, MD;  Location: [facility] INVASIVE CV LAB;  Service: Cardiovascular;  Laterality: N/A;  . LEFT HEART CATH AND  CORONARY ANGIOGRAPHY N/A 02/20/2018   Procedure: LEFT HEART CATH AND CORONARY ANGIOGRAPHY;  Surgeon: Lennette Bihari, MD;  Location: [facility] INVASIVE CV LAB;  Service: Cardiovascular;  Laterality: N/A;  . TOTAL ABDOMINAL HYSTERECTOMY W/ BILATERAL SALPINGOOPHORECTOMY      Current Medications: Current Meds  Medication Sig  . ALPRAZolam (XANAX) 0.5 MG tablet Take 0.25 mg by mouth daily as needed for anxiety.  Marland Kitchen aspirin EC 81 MG tablet Take 81 mg by mouth daily.  . bumetanide (BUMEX) 1 MG tablet Take 1 mg by mouth daily as needed (swelling).   Marland Kitchen buPROPion (WELLBUTRIN XL) 300 MG 24 hr tablet Take 300 mg by mouth at bedtime.   . cefdinir (OMNICEF) 300 MG capsule Take 300 mg by mouth every 12 (twelve) hours.  . Cholecalciferol (VITAMIN D) 2000 units CAPS Take 2,000 Units by mouth daily.  Marland Kitchen diltiazem (CARDIZEM CD) 120 MG 24 hr capsule Take 1 capsule (120 mg total) by mouth daily.  . enalapril (VASOTEC) 20 MG tablet Take 20 mg by mouth daily.  . folic acid (FOLVITE) 400 MCG tablet Take 400 mcg by mouth daily.  Marland Kitchen latanoprost (XALATAN) 0.005 % ophthalmic solution Place 1 drop into both eyes at bedtime.  Marland Kitchen levothyroxine (SYNTHROID, LEVOTHROID) 25 MCG tablet Take 25 mcg by mouth daily before breakfast.  . methotrexate (RHEUMATREX) 2.5 MG tablet Take 3 tablets (7.5 mg total) by mouth once a week. (Patient taking differently: Take 10 mg by mouth every Sunday. )  . metoprolol tartrate (LOPRESSOR) 25 MG tablet Take 25 mg by mouth 2 (two) times daily.  . Multiple Vitamin (MULTIVITAMIN) capsule Take 1 capsule by mouth daily.  . nitroGLYCERIN (NITROSTAT) 0.4 MG SL tablet Place 0.4 mg under the tongue every 5 (five) minutes as needed for chest pain.   Marland Kitchen omeprazole (PRILOSEC) 20 MG capsule Take 20 mg by mouth 2 (two) times daily before a meal.  . polyethylene glycol (MIRALAX / GLYCOLAX) packet Take 17 g by mouth daily as needed for mild constipation.   . pravastatin (PRAVACHOL) 20 MG tablet TAKE 1 TABLET(20 MG) BY  MOUTH EVERY EVENING  . ranolazine (RANEXA) 1000 MG SR tablet Take 1 tablet (1,000 mg total) by mouth 2 (two) times daily.  . sertraline (ZOLOFT) 100 MG tablet Take 150 mg by mouth daily.   . traMADol (ULTRAM) 50 MG tablet Take one to two po q 6 hours prn pain (Patient taking differently: Take 50 mg by mouth as needed for moderate pain. )  . traZODone (DESYREL) 50 MG tablet Take 1 tablet (50 mg total) by mouth at bedtime.  . Vitamin  D, Ergocalciferol, (DRISDOL) 50000 units CAPS capsule Take 50,000 Units by mouth every Sunday.   . [DISCONTINUED] diphenhydrAMINE (BENADRYL) 25 MG tablet Take 50 mg by mouth at bedtime as needed. Take day of procedure      Allergies:   Codeine, Iodine, and Shellfish allergy   Social History   Socioeconomic History  . Marital status: Married    Spouse name: Not on file  . Number of children: Not on file  . Years of education: Not on file  . Highest education level: Not on file  Occupational History  . Not on file  Social Needs  . Financial resource strain: Not on file  . Food insecurity    Worry: Not on file    Inability: Not on file  . Transportation needs    Medical: Not on file    Non-medical: Not on file  Tobacco Use  . Smoking status: Never Smoker  . Smokeless tobacco: Never Used  Substance and Sexual Activity  . Alcohol use: No  . Drug use: No  . Sexual activity: Not on file  Lifestyle  . Physical activity    Days per week: Not on file    Minutes per session: Not on file  . Stress: Not on file  Relationships  . Social Musicianconnections    Talks on phone: Not on file    Gets together: Not on file    Attends religious service: Not on file    Active member of club or organization: Not on file    Attends meetings of clubs or organizations: Not on file    Relationship status: Not on file  Other Topics Concern  . Not on file  Social History Narrative  . Not on file     Family History: The patient's family history includes Heart disease in  her father and mother.  ROS:   Please see the history of present illness.    All other systems reviewed and are negative.  EKGs/Labs/Other Studies Reviewed:    The following studies were reviewed today: IMPRESSIONS    1. Left ventricular ejection fraction, by visual estimation, is 55 to 60%. The left ventricle has normal function. Normal left ventricular size. Left ventricular septal wall thickness was mildly increased. Mildly increased left ventricular posterior  wall thickness. There is mildly increased left ventricular hypertrophy.  2. Left ventricular diastolic Doppler parameters are indeterminate pattern of LV diastolic filling.  3. Global right ventricle has normal systolic function.The right ventricular size is normal. No increase in right ventricular wall thickness.  4. Left atrial size was mildly dilated.  5. Right atrial size was normal.  6. The mitral valve is normal in structure. No evidence of mitral valve regurgitation. No evidence of mitral stenosis.  7. The tricuspid valve is normal in structure. Tricuspid valve regurgitation is trivial.  8. The pulmonic valve was normal in structure. Pulmonic valve regurgitation is trivial by color flow Doppler.  9. The aortic valve is normal in structure. Aortic valve regurgitation was not visualized by color flow Doppler. Structurally normal aortic valve, with no evidence of sclerosis or stenosis. 10. Normal pulmonary artery systolic pressure. 11. No prior study for comparison.    Recent Labs: 03/20/2018: ALT 32; BUN 12; Creatinine, Ser 0.87; Potassium 4.4; Sodium 144  Recent Lipid Panel No results found for: CHOL, TRIG, HDL, CHOLHDL, VLDL, LDLCALC, LDLDIRECT  Physical Exam:    VS:  BP (!) 160/84 (BP Location: Left Arm, Patient Position: Sitting, Cuff Size: Normal)   Pulse  63   Ht 5\' 3"  (1.6 m)   Wt 224 lb (101.6 kg)   SpO2 97%   BMI 39.68 kg/m     Wt Readings from Last 3 Encounters:  03/11/19 224 lb (101.6 kg)   03/07/19 197 lb (89.4 kg)  01/16/19 225 lb (102.1 kg)     GEN: Patient is in no acute distress HEENT: Normal NECK: No JVD; No carotid bruits LYMPHATICS: No lymphadenopathy CARDIAC: Hear sounds regular, 2/6 systolic murmur at the apex. RESPIRATORY:  Clear to auscultation without rales, wheezing or rhonchi  ABDOMEN: Soft, non-tender, non-distended MUSCULOSKELETAL:  No edema; No deformity  SKIN: Warm and dry NEUROLOGIC:  Alert and oriented x 3 PSYCHIATRIC:  Normal affect   Signed, 03/18/19, MD  03/11/2019 12:55 PM    [facility]

## 2019-03-11 NOTE — Patient Instructions (Addendum)
Medication Instructions:  Your physician recommends that you continue on your current medications as directed. Please refer to the Current Medication list given to you today.  *If you need a refill on your cardiac medications before your next appointment, please call your pharmacy*  Lab Work: Your physician recommends that you have a BMP drawn today. If you have labs (blood work) drawn today and your tests are completely normal, you will receive your results only by:  [place] (if you have MyChart) OR  A paper copy in the mail If you have any lab test that is abnormal or we need to change your treatment, we will call you to review the results.  Testing/Procedures: You had an EKG performed today. You are being scheduled to have a CT of chest performed. Due to your allergy you will need to stay well hydrated.   If the patient has contrast allergy: ? Patient will need a prescription for Prednisone and very clear instructions (as follows): 1. Prednisone 50 mg - take 13 hours prior to test 2. Take another Prednisone 50 mg 7 hours prior to test 3. Take another Prednisone 50 mg 1 hour prior to test 4. Take Benadryl 50 mg 1 hour prior to test  Patient must complete all four doses of above prophylactic medications.  Patient will need a ride after test due to Benadryl.        Coronary Angiogram A coronary angiogram is an X-ray procedure that is used to examine the arteries in the heart. In this procedure, a dye (contrast dye) is injected through a long, thin tube (catheter). The catheter is inserted through the groin, wrist, or arm. The dye is injected into each artery, then X-rays are taken to show if there is a blockage in the arteries of the heart. This procedure can also show if you have valve disease or a disease of the aorta, and it can be used to check the overall function of your heart muscle. You may have a coronary angiogram if:  You are having chest pain, or other symptoms  of angina, and you are at risk for heart disease.  You have an abnormal electrocardiogram (ECG) or stress test.  You have chest pain and heart failure.  You are having irregular heart rhythms.  You and your health care provider determine that the benefits of the test information outweigh the risks of the procedure. Let your health care provider know about:  Any allergies you have, including allergies to contrast dye.  All medicines you are taking, including vitamins, herbs, eye drops, creams, and over-the-counter medicines.  Any problems you or family members have had with anesthetic medicines.  Any blood disorders you have.  Any surgeries you have had.  History of kidney problems or kidney failure.  Any medical conditions you have.  Whether you are pregnant or may be pregnant. What are the risks? Generally, this is a safe procedure. However, problems may occur, including:  Infection.  Allergic reaction to medicines or dyes that are used.  Bleeding from the access site or other locations.  Kidney injury, especially in people with impaired kidney function.  Stroke (rare).  Heart attack (rare).  Damage to other structures or organs. What happens before the procedure? Staying hydrated Follow instructions from your health care provider about hydration, which may include:  Up to 2 hours before the procedure - you may continue to drink clear liquids, such as water, clear fruit juice, black coffee, and plain tea. Eating and drinking  restrictions Follow instructions from your health care provider about eating and drinking, which may include:  8 hours before the procedure - stop eating heavy meals or foods such as meat, fried foods, or fatty foods.  6 hours before the procedure - stop eating light meals or foods, such as toast or cereal.  2 hours before the procedure - stop drinking clear liquids. General instructions  Ask your health care provider about: ? Changing  or stopping your regular medicines. This is especially important if you are taking diabetes medicines or blood thinners. ? Taking medicines such as ibuprofen. These medicines can thin your blood. Do not take these medicines before your procedure if your health care provider instructs you not to, though aspirin may be recommended prior to coronary angiograms.  Plan to have someone take you home from the hospital or clinic.  You may need to have blood tests or X-rays done. What happens during the procedure?  An IV tube will be inserted into one of your veins.  You will be given one or more of the following: ? A medicine to help you relax (sedative). ? A medicine to numb the area where the catheter will be inserted into an artery (local anesthetic).  To reduce your risk of infection: ? Your health care team will wash or sanitize their hands. ? Your skin will be washed with soap. ? Hair may be removed from the area where the catheter will be inserted.  You will be connected to a continuous ECG monitor.  The catheter will be inserted into an artery. The location may be in your groin, in your wrist, or in the fold of your arm (near your elbow).  A type of X-ray (fluoroscopy) will be used to help guide the catheter to the opening of the blood vessel that is being examined.  A dye will be injected into the catheter, and X-rays will be taken. The dye will help to show where any narrowing or blockages are located in the heart arteries.  Tell your health care provider if you have any chest pain or trouble breathing during the procedure.  If blockages are found, your health care provider may perform another procedure, such as inserting a coronary stent. The procedure may vary among health care providers and hospitals. What happens after the procedure?  After the procedure, you will need to keep the area still for a few hours, or for as long as told by your health care provider. If the procedure  is done through the groin, you will be instructed to not bend and not cross your legs.  The insertion site will be checked frequently.  The pulse in your foot or wrist will be checked frequently.  You may have additional blood tests, X-rays, and a test that records the electrical activity of your heart (ECG).  Do not drive for 24 hours if you were given a sedative. Summary  A coronary angiogram is an X-ray procedure that is used to look into the arteries in the heart.  During the procedure, a dye (contrast dye) is injected through a long, thin tube (catheter). The catheter is inserted through the groin, wrist, or arm.  Tell your health care provider about any allergies you have, including allergies to contrast dye.  After the procedure, you will need to keep the area still for a few hours, or for as long as told by your health care provider. This information is not intended to replace advice given to you by  your health care provider. Make sure you discuss any questions you have with your health care provider. Document Released: 10/29/2002 Document Revised: 02/04/2016 Document Reviewed: 02/04/2016 Elsevier Interactive Patient Education  2019 [employer].     Follow-Up: At [facility], you and your health needs are our priority.  As part of our continuing mission to provide you with exceptional heart care, we have created designated Provider Care Teams.  These Care Teams include your primary Cardiologist (physician) and Advanced Practice Providers (APPs -  Physician Assistants and Nurse Practitioners) who all work together to provide you with the care you need, when you need it.  Your next appointment:   3 months  The format for your next appointment:   In Person  Provider:   Belva Crome, MD  Other Instructions  CT Angiogram  A CT angiogram is a procedure to look at the blood vessels in various areas of the body. For this procedure, a large X-ray machine, called a CT  scanner, takes detailed pictures of blood vessels that have been injected with a dye (contrast material). A CT angiogram allows your health care provider to see how well blood is flowing to the area of your body that is being checked. Your health care provider will be able to see if there are any problems, such as a blockage. Tell a health care provider about:  Any allergies you have.  All medicines you are taking, including vitamins, herbs, eye drops, creams, and over-the-counter medicines.  Any problems you or family members have had with anesthetic medicines.  Any blood disorders you have.  Any surgeries you have had.  Any medical conditions you have.  Whether you are pregnant or may be pregnant.  Whether you are breastfeeding.  Any anxiety disorders, chronic pain, or other conditions you have that may increase your stress or prevent you from lying still. What are the risks? Generally, this is a safe procedure. However, problems may occur, including:  Infection.  Bleeding.  Allergic reactions to medicines or dyes.  Damage to other structures or organs.  Kidney damage from the dye or contrast that is used.  Increased risk of cancer from radiation exposure. This risk is low. Talk with your health care provider about: ? The risks and benefits of testing. ? How you can receive the lowest dose of radiation. What happens before the procedure?  Wear comfortable clothing and remove any jewelry.  Follow instructions from your health care provider about eating and drinking. For most people, instructions may include these actions: ? For 12 hours before the test, avoid caffeine. This includes tea, coffee, soda, and energy drinks or pills. ? For 3-4 hours before the test, stop eating or drinking anything but water. ? Stay well hydrated by continuing to drink water before the exam. This will help to clear the contrast dye from your body after the test.  Ask your health care  provider about changing or stopping your regular medicines. This is especially important if you are taking diabetes medicines or blood thinners. What happens during the procedure?  An IV tube will be inserted into one of your veins.  You will be asked to lie on an exam table. This table will slide in and out of the CT machine during the procedure.  Contrast dye will be injected into the IV tube. You might feel warm, or you may get a metallic taste in your mouth.  The table that you are lying on will move into the CT machine  tunnel for the scan.  The person running the machine will give you instructions while the scans are being done. You may be asked to: ? Keep your arms above your head. ? Hold your breath. ? Stay very still, even if the table is moving.  When the scanning is complete, you will be moved out of the machine.  The IV tube will be removed. The procedure may vary among health care providers and hospitals. What happens after the procedure?  You might feel warm, or you may get a metallic taste in your mouth.  You may be asked to drink water or other fluids to wash (flush) the contrast material out of your body.  It is up to you to get the results of your procedure. Ask your health care provider, or the department that is doing the procedure, when your results will be ready. Summary  A CT angiogram is a procedure to look at the blood vessels in various areas of the body.  You will need to stay very still during the exam.  You may be asked to drink water or other fluids to wash (flush) the contrast material out of your body after your scan. This information is not intended to replace advice given to you by your health care provider. Make sure you discuss any questions you have with your health care provider. Document Released: 12/23/2015 Document Revised: 07/04/2018 Document Reviewed: 12/23/2015 Elsevier Patient Education  2020 [employer]

## 2019-03-12 DIAGNOSIS — R0602 Shortness of breath: Secondary | ICD-10-CM | POA: Diagnosis not present

## 2019-03-12 DIAGNOSIS — R0989 Other specified symptoms and signs involving the circulatory and respiratory systems: Secondary | ICD-10-CM | POA: Diagnosis not present

## 2019-03-12 DIAGNOSIS — R0609 Other forms of dyspnea: Secondary | ICD-10-CM | POA: Diagnosis not present

## 2019-03-12 LAB — BASIC METABOLIC PANEL
BUN/Creatinine Ratio: 12 (ref 12–28)
BUN: 11 mg/dL (ref 8–27)
CO2: 22 mmol/L (ref 20–29)
Calcium: 9.3 mg/dL (ref 8.7–10.3)
Chloride: 106 mmol/L (ref 96–106)
Creatinine, Ser: 0.9 mg/dL (ref 0.57–1.00)
GFR calc Af Amer: 73 mL/min/{1.73_m2} (ref >59)
GFR calc non Af Amer: 63 mL/min/{1.73_m2} (ref >59)
Glucose: 94 mg/dL (ref 65–99)
Potassium: 4.4 mmol/L (ref 3.5–5.2)
Sodium: 140 mmol/L (ref 134–144)

## 2019-03-13 ENCOUNTER — Telehealth: Payer: Self-pay

## 2019-03-13 NOTE — Telephone Encounter (Signed)
-----   Message from Rajan R Revankar, MD sent at 03/12/2019  8:11 AM EST ----- The results of the study is unremarkable. Please inform patient. I will discuss in detail at next appointment. Cc  primary care/referring physician Rajan R Revankar, MD 03/12/2019 8:11 AM 

## 2019-03-13 NOTE — Telephone Encounter (Signed)
CT results and lab results relayed to patient. She was instructed to f/u with Dr. Henrene Pastor concerning her hiatal hernia. Copy of results sent to Dr. Henrene Pastor.

## 2019-03-17 DIAGNOSIS — Z1159 Encounter for screening for other viral diseases: Secondary | ICD-10-CM | POA: Diagnosis not present

## 2019-03-17 DIAGNOSIS — N3000 Acute cystitis without hematuria: Secondary | ICD-10-CM | POA: Diagnosis not present

## 2019-03-18 DIAGNOSIS — H2513 Age-related nuclear cataract, bilateral: Secondary | ICD-10-CM | POA: Diagnosis not present

## 2019-03-18 DIAGNOSIS — H40003 Preglaucoma, unspecified, bilateral: Secondary | ICD-10-CM | POA: Diagnosis not present

## 2019-03-24 DIAGNOSIS — M150 Primary generalized (osteo)arthritis: Secondary | ICD-10-CM | POA: Diagnosis not present

## 2019-03-24 DIAGNOSIS — M069 Rheumatoid arthritis, unspecified: Secondary | ICD-10-CM | POA: Diagnosis not present

## 2019-03-24 DIAGNOSIS — R682 Dry mouth, unspecified: Secondary | ICD-10-CM | POA: Diagnosis not present

## 2019-03-24 DIAGNOSIS — G894 Chronic pain syndrome: Secondary | ICD-10-CM | POA: Diagnosis not present

## 2019-03-24 DIAGNOSIS — F5101 Primary insomnia: Secondary | ICD-10-CM | POA: Diagnosis not present

## 2019-03-31 DIAGNOSIS — Z1159 Encounter for screening for other viral diseases: Secondary | ICD-10-CM | POA: Diagnosis not present

## 2019-03-31 DIAGNOSIS — N3090 Cystitis, unspecified without hematuria: Secondary | ICD-10-CM | POA: Diagnosis not present

## 2019-03-31 DIAGNOSIS — M6281 Muscle weakness (generalized): Secondary | ICD-10-CM | POA: Diagnosis not present

## 2019-03-31 DIAGNOSIS — Z23 Encounter for immunization: Secondary | ICD-10-CM | POA: Diagnosis not present

## 2019-04-02 ENCOUNTER — Other Ambulatory Visit: Payer: Self-pay

## 2019-04-02 MED ORDER — RANOLAZINE ER 1000 MG PO TB12: 1000.0000 mg | ORAL_TABLET | Freq: Two times a day (BID) | ORAL | 1 refills | Status: DC

## 2019-04-09 ENCOUNTER — Other Ambulatory Visit: Payer: Self-pay

## 2019-04-09 MED ORDER — RANOLAZINE ER 1000 MG PO TB12: 1000.0000 mg | ORAL_TABLET | Freq: Two times a day (BID) | ORAL | 1 refills | Status: DC

## 2019-04-14 DIAGNOSIS — E039 Hypothyroidism, unspecified: Secondary | ICD-10-CM | POA: Diagnosis not present

## 2019-04-14 DIAGNOSIS — E1169 Type 2 diabetes mellitus with other specified complication: Secondary | ICD-10-CM | POA: Diagnosis not present

## 2019-04-14 DIAGNOSIS — E782 Mixed hyperlipidemia: Secondary | ICD-10-CM | POA: Diagnosis not present

## 2019-04-14 DIAGNOSIS — I10 Essential (primary) hypertension: Secondary | ICD-10-CM | POA: Diagnosis not present

## 2019-04-14 DIAGNOSIS — Z0000 Encounter for general adult medical examination without abnormal findings: Secondary | ICD-10-CM | POA: Diagnosis not present

## 2019-04-16 ENCOUNTER — Other Ambulatory Visit: Payer: Self-pay | Admitting: Cardiology

## 2019-04-16 NOTE — Telephone Encounter (Signed)
°*  STAT* If patient is at the pharmacy, call can be transferred to refill team.   1. Which medications need to be refilled? (please list name of each medication and dose if known) Ranexa  2. Which pharmacy/location (including street and city if local pharmacy) is medication to be sent to?[facility]  3. Do they need a 30 day or 90 day supply? [place]

## 2019-04-18 DIAGNOSIS — Z01818 Encounter for other preprocedural examination: Secondary | ICD-10-CM | POA: Diagnosis not present

## 2019-04-18 MED ORDER — RANOLAZINE ER 1000 MG PO TB12: 1000.0000 mg | ORAL_TABLET | Freq: Two times a day (BID) | ORAL | 1 refills | Status: DC

## 2019-04-18 NOTE — Telephone Encounter (Signed)
90 day refill Ranexa sent to [facility] per patient request.   Loel Dubonnet, NP

## 2019-04-21 DIAGNOSIS — H2512 Age-related nuclear cataract, left eye: Secondary | ICD-10-CM | POA: Diagnosis not present

## 2019-04-21 DIAGNOSIS — I509 Heart failure, unspecified: Secondary | ICD-10-CM | POA: Diagnosis not present

## 2019-04-21 DIAGNOSIS — Z823 Family history of stroke: Secondary | ICD-10-CM | POA: Diagnosis not present

## 2019-04-21 DIAGNOSIS — Z885 Allergy status to narcotic agent status: Secondary | ICD-10-CM | POA: Diagnosis not present

## 2019-04-21 DIAGNOSIS — K219 Gastro-esophageal reflux disease without esophagitis: Secondary | ICD-10-CM | POA: Diagnosis not present

## 2019-04-21 DIAGNOSIS — I110 Hypertensive heart disease with heart failure: Secondary | ICD-10-CM | POA: Diagnosis not present

## 2019-04-21 DIAGNOSIS — Z7982 Long term (current) use of aspirin: Secondary | ICD-10-CM | POA: Diagnosis not present

## 2019-04-21 DIAGNOSIS — M069 Rheumatoid arthritis, unspecified: Secondary | ICD-10-CM | POA: Diagnosis not present

## 2019-04-21 DIAGNOSIS — Z791 Long term (current) use of non-steroidal anti-inflammatories (NSAID): Secondary | ICD-10-CM | POA: Diagnosis not present

## 2019-04-21 DIAGNOSIS — M797 Fibromyalgia: Secondary | ICD-10-CM | POA: Diagnosis not present

## 2019-04-21 DIAGNOSIS — M1990 Unspecified osteoarthritis, unspecified site: Secondary | ICD-10-CM | POA: Diagnosis not present

## 2019-04-21 DIAGNOSIS — E1136 Type 2 diabetes mellitus with diabetic cataract: Secondary | ICD-10-CM | POA: Diagnosis not present

## 2019-04-21 DIAGNOSIS — E785 Hyperlipidemia, unspecified: Secondary | ICD-10-CM | POA: Diagnosis not present

## 2019-04-21 DIAGNOSIS — Z79899 Other long term (current) drug therapy: Secondary | ICD-10-CM | POA: Diagnosis not present

## 2019-04-21 DIAGNOSIS — E079 Disorder of thyroid, unspecified: Secondary | ICD-10-CM | POA: Diagnosis not present

## 2019-05-03 DIAGNOSIS — Z01818 Encounter for other preprocedural examination: Secondary | ICD-10-CM | POA: Diagnosis not present

## 2019-05-05 DIAGNOSIS — I509 Heart failure, unspecified: Secondary | ICD-10-CM | POA: Diagnosis not present

## 2019-05-05 DIAGNOSIS — M069 Rheumatoid arthritis, unspecified: Secondary | ICD-10-CM | POA: Diagnosis not present

## 2019-05-05 DIAGNOSIS — E785 Hyperlipidemia, unspecified: Secondary | ICD-10-CM | POA: Diagnosis not present

## 2019-05-05 DIAGNOSIS — I110 Hypertensive heart disease with heart failure: Secondary | ICD-10-CM | POA: Diagnosis not present

## 2019-05-05 DIAGNOSIS — Z7982 Long term (current) use of aspirin: Secondary | ICD-10-CM | POA: Diagnosis not present

## 2019-05-05 DIAGNOSIS — H2511 Age-related nuclear cataract, right eye: Secondary | ICD-10-CM | POA: Diagnosis not present

## 2019-05-05 DIAGNOSIS — H269 Unspecified cataract: Secondary | ICD-10-CM | POA: Diagnosis not present

## 2019-05-05 DIAGNOSIS — Z823 Family history of stroke: Secondary | ICD-10-CM | POA: Diagnosis not present

## 2019-05-05 DIAGNOSIS — M797 Fibromyalgia: Secondary | ICD-10-CM | POA: Diagnosis not present

## 2019-05-05 DIAGNOSIS — K219 Gastro-esophageal reflux disease without esophagitis: Secondary | ICD-10-CM | POA: Diagnosis not present

## 2019-05-05 DIAGNOSIS — E079 Disorder of thyroid, unspecified: Secondary | ICD-10-CM | POA: Diagnosis not present

## 2019-05-05 DIAGNOSIS — Z791 Long term (current) use of non-steroidal anti-inflammatories (NSAID): Secondary | ICD-10-CM | POA: Diagnosis not present

## 2019-05-05 DIAGNOSIS — E1136 Type 2 diabetes mellitus with diabetic cataract: Secondary | ICD-10-CM | POA: Diagnosis not present

## 2019-05-05 DIAGNOSIS — M1990 Unspecified osteoarthritis, unspecified site: Secondary | ICD-10-CM | POA: Diagnosis not present

## 2019-05-05 DIAGNOSIS — Z885 Allergy status to narcotic agent status: Secondary | ICD-10-CM | POA: Diagnosis not present

## 2019-05-05 DIAGNOSIS — Z79899 Other long term (current) drug therapy: Secondary | ICD-10-CM | POA: Diagnosis not present

## 2019-05-13 DIAGNOSIS — Z1211 Encounter for screening for malignant neoplasm of colon: Secondary | ICD-10-CM | POA: Diagnosis not present

## 2019-05-16 DIAGNOSIS — H25811 Combined forms of age-related cataract, right eye: Secondary | ICD-10-CM | POA: Diagnosis not present

## 2019-06-16 ENCOUNTER — Encounter: Payer: Self-pay | Admitting: Cardiology

## 2019-06-16 ENCOUNTER — Ambulatory Visit (INDEPENDENT_AMBULATORY_CARE_PROVIDER_SITE_OTHER): Payer: Medicare Other | Admitting: Cardiology

## 2019-06-16 ENCOUNTER — Other Ambulatory Visit: Payer: Self-pay

## 2019-06-16 VITALS — BP 134/80 | HR 103 | Ht 63.0 in | Wt 207.0 lb

## 2019-06-16 DIAGNOSIS — Z1329 Encounter for screening for other suspected endocrine disorder: Secondary | ICD-10-CM

## 2019-06-16 DIAGNOSIS — E782 Mixed hyperlipidemia: Secondary | ICD-10-CM

## 2019-06-16 DIAGNOSIS — E119 Type 2 diabetes mellitus without complications: Secondary | ICD-10-CM

## 2019-06-16 DIAGNOSIS — I10 Essential (primary) hypertension: Secondary | ICD-10-CM

## 2019-06-16 DIAGNOSIS — I209 Angina pectoris, unspecified: Secondary | ICD-10-CM

## 2019-06-16 DIAGNOSIS — E669 Obesity, unspecified: Secondary | ICD-10-CM

## 2019-06-16 MED ORDER — DILTIAZEM HCL ER COATED BEADS 120 MG PO CP24: 120.0000 mg | ORAL_CAPSULE | Freq: Every day | ORAL | 3 refills | Status: DC

## 2019-06-16 NOTE — Patient Instructions (Signed)
Medication Instructions:  No medication changes  *If you need a refill on your cardiac medications before your next appointment, please call your pharmacy*  Lab Work: You need to come back and have fasting labs. If you have labs (blood work) drawn today and your tests are completely normal, you will receive your results only by: [place] MyChart Message (if you have MyChart) OR . A paper copy in the mail If you have any lab test that is abnormal or we need to change your treatment, we will call you to review the results.  Testing/Procedures: None you needed  Follow-Up: At [facility], you and your health needs are our priority.  As part of our continuing mission to provide you with exceptional heart care, we have created designated Provider Care Teams.  These Care Teams include your primary Cardiologist (physician) and Advanced Practice Providers (APPs -  Physician Assistants and Nurse Practitioners) who all work together to provide you with the care you need, when you need it.  Your next appointment:   6 month(s)  The format for your next appointment:   In Person  Provider:   Belva Crome, MD  Other Instructions NA

## 2019-06-16 NOTE — Progress Notes (Signed)
Cardiology Office Note:    Date:  06/16/2019   ID:  Deborah Farley, DOB 1946-05-08, MRN [id number]  PCP:  Abigail Miyamoto, MD  Cardiologist:  Garwin Brothers, MD   Referring MD: Abigail Miyamoto,*    ASSESSMENT:    1. Essential hypertension   2. Angina pectoris (HCC)   3. Mixed hyperlipidemia   4. Diet-controlled diabetes mellitus (HCC)   5. Obesity (BMI 35.0-39.9 without comorbidity)    PLAN:    In order of problems listed above:  1. Coronary artery disease: I discussed my findings with the patient at extensive length and secondary prevention stressed.  Importance of compliance with diet and medication stressed and she vocalized understanding. 2. Importance of regular exercise stressed and I told her to walk at least half an hour a day at least 5 days a week and she promises to do so. 3. She will have blood work in the next few days including fasting lipids 4. Mixed dyslipidemia: Diet was discussed for dyslipidemia.  Weight reduction was stressed.   5. Essential hypertension: Blood pressure is stable.  As mentioned above she checks it regularly at home it is fine at home.  We will continue current medications.  Prescription for Cardizem CD 120 mg will be sent today. 6. Patient will be seen in follow-up appointment in 6 months or earlier if the patient has any concerns    Medication Adjustments/Labs and Tests Ordered: Current medicines are reviewed at length with the patient today.  Concerns regarding medicines are outlined above.  No orders of the defined types were placed in this encounter.  No orders of the defined types were placed in this encounter.    Chief Complaint  Patient presents with  . Follow-up    3 Months     History of Present Illness:    Deborah Farley is a 74 y.o. female.  Patient has history of mild nonobstructive coronary artery disease, coronary arterial bridging, essential hypertension and dyslipidemia.  She leads a sedentary lifestyle  and is overweight.  No chest pain orthopnea or PND.  She has issues going on in her family which is why she is stressed out.  She tells me that this is why her heart rate and blood pressure elevated today and overall they are fine.  She mentioned to me the numbers.  At the time of my evaluation, the patient is alert awake oriented and in no distress.  Past Medical History:  Diagnosis Date  . Depression   . Diabetes mellitus without complication (HCC)   . Heart disease   . Hernia, hiatal   . High cholesterol   . Hypertension   . Hypothyroidism   . Memory difficulty   . Spastic esophagus     Past Surgical History:  Procedure Laterality Date  . ABDOMINAL HYSTERECTOMY    . BACK SURGERY    . CARDIAC CATHETERIZATION N/A 08/12/2015   Procedure: Left Heart Cath and Coronary Angiography;  Surgeon: Yates Decamp, MD;  Location: [facility] INVASIVE CV LAB;  Service: Cardiovascular;  Laterality: N/A;  . LEFT HEART CATH AND CORONARY ANGIOGRAPHY N/A 02/20/2018   Procedure: LEFT HEART CATH AND CORONARY ANGIOGRAPHY;  Surgeon: Lennette Bihari, MD;  Location: [facility] INVASIVE CV LAB;  Service: Cardiovascular;  Laterality: N/A;  . TOTAL ABDOMINAL HYSTERECTOMY W/ BILATERAL SALPINGOOPHORECTOMY      Current Medications: Current Meds  Medication Sig  . ALPRAZolam (XANAX) 0.5 MG tablet Take 0.25 mg by mouth daily as needed for anxiety.  Marland Kitchen  aspirin EC 81 MG tablet Take 81 mg by mouth daily.  . bumetanide (BUMEX) 1 MG tablet Take 1 mg by mouth daily as needed (swelling).   Marland Kitchen buPROPion (WELLBUTRIN XL) 300 MG 24 hr tablet Take 300 mg by mouth at bedtime.   . Cholecalciferol (VITAMIN D) 2000 units CAPS Take 2,000 Units by mouth daily.  Marland Kitchen diltiazem (CARDIZEM CD) 120 MG 24 hr capsule Take 1 capsule (120 mg total) by mouth daily.  Marland Kitchen diltiazem (TIAZAC) 180 MG 24 hr capsule Take by mouth.  . diphenhydrAMINE (BENADRYL) 50 MG tablet Take 1 tablet one hour prior to your procedure  . enalapril (VASOTEC) 20 MG tablet Take 20 mg by mouth  daily.  . folic acid (FOLVITE) 400 MCG tablet Take 400 mcg by mouth daily.  Marland Kitchen latanoprost (XALATAN) 0.005 % ophthalmic solution Place 1 drop into both eyes at bedtime.  Marland Kitchen levothyroxine (SYNTHROID, LEVOTHROID) 25 MCG tablet Take 25 mcg by mouth daily before breakfast.  . methotrexate (RHEUMATREX) 2.5 MG tablet Take 3 tablets (7.5 mg total) by mouth once a week. (Patient taking differently: Take 10 mg by mouth every Sunday. )  . metoprolol tartrate (LOPRESSOR) 25 MG tablet Take 25 mg by mouth 2 (two) times daily.  . Multiple Vitamin (MULTIVITAMIN) capsule Take 1 capsule by mouth daily.  . nitroGLYCERIN (NITROSTAT) 0.4 MG SL tablet Place 0.4 mg under the tongue every 5 (five) minutes as needed for chest pain.   Marland Kitchen omeprazole (PRILOSEC) 20 MG capsule Take 20 mg by mouth 2 (two) times daily before a meal.  . polyethylene glycol (MIRALAX / GLYCOLAX) packet Take 17 g by mouth daily as needed for mild constipation.   . pravastatin (PRAVACHOL) 20 MG tablet TAKE 1 TABLET(20 MG) BY MOUTH EVERY EVENING  . ranolazine (RANEXA) 1000 MG SR tablet Take 1 tablet (1,000 mg total) by mouth 2 (two) times daily.  . sertraline (ZOLOFT) 100 MG tablet Take 150 mg by mouth daily.   . traMADol (ULTRAM) 50 MG tablet Take one to two po q 6 hours prn pain (Patient taking differently: Take 50 mg by mouth as needed for moderate pain. )  . traZODone (DESYREL) 50 MG tablet Take 1 tablet (50 mg total) by mouth at bedtime.  . Vitamin D, Ergocalciferol, (DRISDOL) 50000 units CAPS capsule Take 50,000 Units by mouth every Sunday.      Allergies:   Codeine, Iodine, and Shellfish allergy   Social History   Socioeconomic History  . Marital status: Married    Spouse name: Not on file  . Number of children: Not on file  . Years of education: Not on file  . Highest education level: Not on file  Occupational History  . Not on file  Tobacco Use  . Smoking status: Never Smoker  . Smokeless tobacco: Never Used  Substance and Sexual  Activity  . Alcohol use: No  . Drug use: No  . Sexual activity: Not on file  Other Topics Concern  . Not on file  Social History Narrative  . Not on file   Social Determinants of Health   Financial Resource Strain:   . Difficulty of Paying Living Expenses: Not on file  Food Insecurity:   . Worried About Programme researcher, broadcasting/film/video in the Last Year: Not on file  . Ran Out of Food in the Last Year: Not on file  Transportation Needs:   . Lack of Transportation (Medical): Not on file  . Lack of Transportation (Non-Medical): Not on file  Physical Activity:   . Days of Exercise per Week: Not on file  . Minutes of Exercise per Session: Not on file  Stress:   . Feeling of Stress : Not on file  Social Connections:   . Frequency of Communication with Friends and Family: Not on file  . Frequency of Social Gatherings with Friends and Family: Not on file  . Attends Religious Services: Not on file  . Active Member of Clubs or Organizations: Not on file  . Attends Archivist Meetings: Not on file  . Marital Status: Not on file     Family History: The patient's family history includes Heart disease in her father and mother.  ROS:   Please see the history of present illness.    All other systems reviewed and are negative.  EKGs/Labs/Other Studies Reviewed:    The following studies were reviewed today: Study date: 02/20/18  MyChart Results Release  MyChart Status: Active Results Release  Physicians  Panel Physicians Referring Physician Case Authorizing Physician  Troy Sine, MD (Primary)    Procedures  LEFT HEART CATH AND CORONARY ANGIOGRAPHY  Conclusion    Ost Cx lesion is 25% stenosed.  Ost LAD lesion is 15% stenosed.  Non-stenotic Mid LAD lesion.   Normal LV function with an ejection fraction estimate at approximately 55%.  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  Smooth 25% ostial narrowing of the left circumflex coronary artery.  Normal dominant RCA.  RECOMMENDATION: Medical therapy for CAD and systolic bridging of the mid LAD.  Rest of lipid-lowering therapy with target LDL less than 70. Recommend Aspirin 81mg  daily for moderate CAD.      Recent Labs: 03/11/2019: BUN 11; Creatinine, Ser 0.90; Potassium 4.4; Sodium 140  Recent Lipid Panel No results found for: CHOL, TRIG, HDL, CHOLHDL, VLDL, LDLCALC, LDLDIRECT  Physical Exam:    VS:  BP 134/80   Pulse (!) 103   Ht 5\' 3"  (1.6 m)   Wt 207 lb (93.9 kg)   SpO2 97%   BMI 36.67 kg/m     Wt Readings from Last 3 Encounters:  06/16/19 207 lb (93.9 kg)  03/11/19 224 lb (101.6 kg)  03/07/19 197 lb (89.4 kg)     GEN: Patient is in no acute distress HEENT: Normal NECK: No JVD; No carotid bruits LYMPHATICS: No lymphadenopathy CARDIAC: Hear sounds regular, 2/6 systolic murmur at the apex. RESPIRATORY:  Clear to auscultation without rales, wheezing or rhonchi  ABDOMEN: Soft, non-tender, non-distended MUSCULOSKELETAL:  No edema; No deformity  SKIN: Warm and dry NEUROLOGIC:  Alert and oriented x 3 PSYCHIATRIC:  Normal affect   Signed, Jenean Lindau, MD  06/16/2019 3:49 PM    [facility]

## 2019-06-26 DIAGNOSIS — R1013 Epigastric pain: Secondary | ICD-10-CM | POA: Diagnosis not present

## 2019-06-26 DIAGNOSIS — R112 Nausea with vomiting, unspecified: Secondary | ICD-10-CM | POA: Diagnosis not present

## 2019-06-26 DIAGNOSIS — K760 Fatty (change of) liver, not elsewhere classified: Secondary | ICD-10-CM | POA: Diagnosis not present

## 2019-06-26 DIAGNOSIS — K8050 Calculus of bile duct without cholangitis or cholecystitis without obstruction: Secondary | ICD-10-CM | POA: Diagnosis not present

## 2019-06-26 DIAGNOSIS — R079 Chest pain, unspecified: Secondary | ICD-10-CM | POA: Diagnosis not present

## 2019-07-24 ENCOUNTER — Other Ambulatory Visit: Payer: Self-pay

## 2019-07-24 MED ORDER — OMEPRAZOLE 20 MG PO CPDR: 20.0000 mg | DELAYED_RELEASE_CAPSULE | Freq: Two times a day (BID) | ORAL | 2 refills | Status: DC

## 2019-07-28 DIAGNOSIS — E119 Type 2 diabetes mellitus without complications: Secondary | ICD-10-CM | POA: Diagnosis not present

## 2019-07-28 DIAGNOSIS — Z1329 Encounter for screening for other suspected endocrine disorder: Secondary | ICD-10-CM | POA: Diagnosis not present

## 2019-07-28 DIAGNOSIS — I209 Angina pectoris, unspecified: Secondary | ICD-10-CM | POA: Diagnosis not present

## 2019-07-28 DIAGNOSIS — E782 Mixed hyperlipidemia: Secondary | ICD-10-CM | POA: Diagnosis not present

## 2019-07-28 DIAGNOSIS — I10 Essential (primary) hypertension: Secondary | ICD-10-CM | POA: Diagnosis not present

## 2019-07-29 LAB — CBC WITH DIFFERENTIAL/PLATELET
Basophils Absolute: 0.1 10*3/uL (ref 0.0–0.2)
Basos: 1 %
EOS (ABSOLUTE): 0.3 10*3/uL (ref 0.0–0.4)
Eos: 3 %
Hematocrit: 43.3 % (ref 34.0–46.6)
Hemoglobin: 14.7 g/dL (ref 11.1–15.9)
Immature Grans (Abs): 0 10*3/uL (ref 0.0–0.1)
Immature Granulocytes: 0 %
Lymphocytes Absolute: 2.2 10*3/uL (ref 0.7–3.1)
Lymphs: 28 %
MCH: 33.6 pg — ABNORMAL HIGH (ref 26.6–33.0)
MCHC: 33.9 g/dL (ref 31.5–35.7)
MCV: 99 fL — ABNORMAL HIGH (ref 79–97)
Monocytes Absolute: 0.6 10*3/uL (ref 0.1–0.9)
Monocytes: 8 %
Neutrophils Absolute: 4.8 10*3/uL (ref 1.4–7.0)
Neutrophils: 60 %
Platelets: 226 10*3/uL (ref 150–450)
RBC: 4.38 x10E6/uL (ref 3.77–5.28)
RDW: 13.4 % (ref 11.7–15.4)
WBC: 8 10*3/uL (ref 3.4–10.8)

## 2019-07-29 LAB — HEPATIC FUNCTION PANEL
ALT: 19 IU/L (ref 0–32)
AST: 24 IU/L (ref 0–40)
Albumin: 4 g/dL (ref 3.7–4.7)
Alkaline Phosphatase: 126 IU/L — ABNORMAL HIGH (ref 39–117)
Bilirubin Total: 0.6 mg/dL (ref 0.0–1.2)
Bilirubin, Direct: 0.19 mg/dL (ref 0.00–0.40)
Total Protein: 6.8 g/dL (ref 6.0–8.5)

## 2019-07-29 LAB — BASIC METABOLIC PANEL
BUN/Creatinine Ratio: 12 (ref 12–28)
BUN: 12 mg/dL (ref 8–27)
CO2: 22 mmol/L (ref 20–29)
Calcium: 9.5 mg/dL (ref 8.7–10.3)
Chloride: 106 mmol/L (ref 96–106)
Creatinine, Ser: 0.99 mg/dL (ref 0.57–1.00)
GFR calc Af Amer: 65 mL/min/{1.73_m2} (ref >59)
GFR calc non Af Amer: 56 mL/min/{1.73_m2} — ABNORMAL LOW (ref >59)
Glucose: 94 mg/dL (ref 65–99)
Potassium: 4.8 mmol/L (ref 3.5–5.2)
Sodium: 142 mmol/L (ref 134–144)

## 2019-07-29 LAB — LIPID PANEL
Chol/HDL Ratio: 3.4 ratio (ref 0.0–4.4)
Cholesterol, Total: 168 mg/dL (ref 100–199)
HDL: 50 mg/dL (ref >39)
LDL Chol Calc (NIH): 97 mg/dL (ref 0–99)
Triglycerides: 120 mg/dL (ref 0–149)
VLDL Cholesterol Cal: 21 mg/dL (ref 5–40)

## 2019-07-29 LAB — TSH: TSH: 3.47 u[IU]/mL (ref 0.450–4.500)

## 2019-07-30 ENCOUNTER — Encounter: Payer: Self-pay | Admitting: *Deleted

## 2019-08-19 ENCOUNTER — Telehealth: Payer: Self-pay

## 2019-08-19 NOTE — Telephone Encounter (Signed)
Per Dr. Silvana Newness office, Alona Bene canceled her appointment.

## 2019-08-26 DIAGNOSIS — M069 Rheumatoid arthritis, unspecified: Secondary | ICD-10-CM | POA: Diagnosis not present

## 2019-08-26 DIAGNOSIS — M150 Primary generalized (osteo)arthritis: Secondary | ICD-10-CM | POA: Diagnosis not present

## 2019-08-26 DIAGNOSIS — R682 Dry mouth, unspecified: Secondary | ICD-10-CM | POA: Diagnosis not present

## 2019-08-26 DIAGNOSIS — F5101 Primary insomnia: Secondary | ICD-10-CM | POA: Diagnosis not present

## 2019-08-26 DIAGNOSIS — G894 Chronic pain syndrome: Secondary | ICD-10-CM | POA: Diagnosis not present

## 2019-09-14 ENCOUNTER — Other Ambulatory Visit: Payer: Self-pay | Admitting: Legal Medicine

## 2019-09-30 DIAGNOSIS — H401122 Primary open-angle glaucoma, left eye, moderate stage: Secondary | ICD-10-CM | POA: Diagnosis not present

## 2019-09-30 DIAGNOSIS — H02885 Meibomian gland dysfunction left lower eyelid: Secondary | ICD-10-CM | POA: Diagnosis not present

## 2019-09-30 DIAGNOSIS — H401111 Primary open-angle glaucoma, right eye, mild stage: Secondary | ICD-10-CM | POA: Diagnosis not present

## 2019-10-03 ENCOUNTER — Other Ambulatory Visit: Payer: Self-pay | Admitting: Legal Medicine

## 2019-11-07 ENCOUNTER — Other Ambulatory Visit: Payer: Self-pay | Admitting: Legal Medicine

## 2019-11-11 ENCOUNTER — Other Ambulatory Visit: Payer: Self-pay | Admitting: Legal Medicine

## 2019-12-03 DIAGNOSIS — M069 Rheumatoid arthritis, unspecified: Secondary | ICD-10-CM | POA: Diagnosis not present

## 2019-12-03 DIAGNOSIS — M150 Primary generalized (osteo)arthritis: Secondary | ICD-10-CM | POA: Diagnosis not present

## 2019-12-03 DIAGNOSIS — G894 Chronic pain syndrome: Secondary | ICD-10-CM | POA: Diagnosis not present

## 2019-12-03 DIAGNOSIS — F5101 Primary insomnia: Secondary | ICD-10-CM | POA: Diagnosis not present

## 2019-12-03 DIAGNOSIS — R682 Dry mouth, unspecified: Secondary | ICD-10-CM | POA: Diagnosis not present

## 2020-01-14 ENCOUNTER — Ambulatory Visit: Payer: Medicare Other | Admitting: Cardiology

## 2020-01-14 ENCOUNTER — Other Ambulatory Visit: Payer: Self-pay

## 2020-01-14 ENCOUNTER — Encounter: Payer: Self-pay | Admitting: Cardiology

## 2020-01-14 VITALS — BP 136/78 | HR 108 | Ht 63.0 in | Wt 202.8 lb

## 2020-01-14 DIAGNOSIS — I209 Angina pectoris, unspecified: Secondary | ICD-10-CM | POA: Diagnosis not present

## 2020-01-14 DIAGNOSIS — Q245 Malformation of coronary vessels: Secondary | ICD-10-CM | POA: Diagnosis not present

## 2020-01-14 DIAGNOSIS — E119 Type 2 diabetes mellitus without complications: Secondary | ICD-10-CM | POA: Diagnosis not present

## 2020-01-14 DIAGNOSIS — E782 Mixed hyperlipidemia: Secondary | ICD-10-CM | POA: Diagnosis not present

## 2020-01-14 DIAGNOSIS — I10 Essential (primary) hypertension: Secondary | ICD-10-CM | POA: Diagnosis not present

## 2020-01-14 MED ORDER — PREDNISONE 50 MG PO TABS: ORAL_TABLET | ORAL | 0 refills | Status: DC

## 2020-01-14 MED ORDER — NITROGLYCERIN 0.4 MG SL SUBL: 0.4000 mg | SUBLINGUAL_TABLET | SUBLINGUAL | 0 refills | Status: DC | PRN

## 2020-01-14 NOTE — Progress Notes (Signed)
Cardiology Office Note:    Date:  01/14/2020   ID:  Deborah Farley, DOB 04-09-1946, MRN [id number]  PCP:  Abigail Miyamoto, MD  Cardiologist:  Garwin Brothers, MD   Referring MD: Abigail Miyamoto,*    ASSESSMENT:    1. Coronary-myocardial bridge   2. Mixed hyperlipidemia   3. Essential hypertension   4. Diet-controlled diabetes mellitus (HCC)   5. Angina pectoris (HCC)    PLAN:    In order of problems listed above:  1. Angina pectoris:In view of the patient's symptoms, I discussed with the patient options for evaluation. Invasive and noninvasive options were given to the patient. I discussed stress testing and coronary angiography and left heart catheterization at length. Benefits, pros and cons of each approach were discussed at length. Patient had multiple questions which were answered to the patient's satisfaction. Patient opted for invasive evaluation and we will set up for coronary angiography and left heart catheterization. Further recommendations will be made based on the findings with coronary angiography. In the interim if the patient has any significant symptoms in hospital to the nearest emergency room.  EKG done today reveals sinus rhythm and nonspecific ST-T changes. 2. The patient's symptoms are concerning.  Sublingual nitroglycerin prescription was sent, its protocol and 911 protocol explained and the patient vocalized understanding questions were answered to the patient's satisfaction 3. Essential hypertension: Blood pressure stable 4. Mixed dyslipidemia diabetes mellitus: Diet was emphasized.  Weight reduction was stressed she promises to do so.   Medication Adjustments/Labs and Tests Ordered: Current medicines are reviewed at length with the patient today.  Concerns regarding medicines are outlined above.  No orders of the defined types were placed in this encounter.  No orders of the defined types were placed in this encounter.    No chief complaint  on file.    History of Present Illness:    Deborah Farley is a 74 y.o. female.  Patient has past medical history of coronary artery disease, myocardial bridge essential hypertension and dyslipidemia.  She mentions to me that she has chest tightness on exertion consistently when she starts exerting or when she is under stress.  No chest pain orthopnea or PND.  This is of significant concern to her.  She is very worried about the symptoms.  At the time of my evaluation, the patient is alert awake oriented and in no distress.  Past Medical History:  Diagnosis Date  . Depression   . Diabetes mellitus without complication (HCC)   . Heart disease   . Hernia, hiatal   . High cholesterol   . Hypertension   . Hypothyroidism   . Memory difficulty   . Spastic esophagus     Past Surgical History:  Procedure Laterality Date  . ABDOMINAL HYSTERECTOMY    . BACK SURGERY    . CARDIAC CATHETERIZATION N/A 08/12/2015   Procedure: Left Heart Cath and Coronary Angiography;  Surgeon: Yates Decamp, MD;  Location: [facility] INVASIVE CV LAB;  Service: Cardiovascular;  Laterality: N/A;  . LEFT HEART CATH AND CORONARY ANGIOGRAPHY N/A 02/20/2018   Procedure: LEFT HEART CATH AND CORONARY ANGIOGRAPHY;  Surgeon: Lennette Bihari, MD;  Location: [facility] INVASIVE CV LAB;  Service: Cardiovascular;  Laterality: N/A;  . TOTAL ABDOMINAL HYSTERECTOMY W/ BILATERAL SALPINGOOPHORECTOMY      Current Medications: Current Meds  Medication Sig  . ALPRAZolam (XANAX) 0.5 MG tablet Take 0.25 mg by mouth daily as needed for anxiety.  Marland Kitchen aspirin EC 81 MG tablet Take 81  mg by mouth daily.  . bumetanide (BUMEX) 1 MG tablet Take 1 mg by mouth daily as needed (swelling).   . buPROPion (WELLBUTRIN XL) 300 MG 24 hr tablet Take 300 mg by mouth at bedtime.   . Cholecalciferol (VITAMIN D) 2000 units CAPS Take 2,000 Units by mouth daily.  . diltiazem (CARDIZEM CD) 120 MG 24 hr capsule Take 1 capsule (120 mg total) by mouth daily.  . diphenhydrAMINE  (BENADRYL) 50 MG tablet Take 1 tablet one hour prior to your procedure  . enalapril (VASOTEC) 20 MG tablet TAKE 1 TABLET BY MOUTH ONCE DAILY  . folic acid (FOLVITE) 400 MCG tablet Take 400 mcg by mouth daily.  . latanoprost (XALATAN) 0.005 % ophthalmic solution Place 1 drop into both eyes at bedtime.  . levothyroxine (SYNTHROID) 25 MCG tablet TAKE 1 TABLET BY MOUTH  DAILY  . methotrexate (RHEUMATREX) 2.5 MG tablet Take 3 tablets (7.5 mg total) by mouth once a week. (Patient taking differently: Take 10 mg by mouth every Sunday. )  . metoprolol tartrate (LOPRESSOR) 25 MG tablet Take 25 mg by mouth 2 (two) times daily.  . Multiple Vitamin (MULTIVITAMIN) capsule Take 1 capsule by mouth daily.  . nitroGLYCERIN (NITROSTAT) 0.4 MG SL tablet Place 0.4 mg under the tongue every 5 (five) minutes as needed for chest pain.   . omeprazole (PRILOSEC) 20 MG capsule Take 1 capsule (20 mg total) by mouth 2 (two) times daily before a meal.  . polyethylene glycol (MIRALAX / GLYCOLAX) packet Take 17 g by mouth daily as needed for mild constipation.   . pravastatin (PRAVACHOL) 20 MG tablet TAKE ONE-HALF TABLET BY  MOUTH ONCE DAILY  . ranolazine (RANEXA) 1000 MG SR tablet Take 1 tablet (1,000 mg total) by mouth 2 (two) times daily.  . sertraline (ZOLOFT) 100 MG tablet TAKE 1 TABLET BY MOUTH ONCE DAILY  . traMADol (ULTRAM) 50 MG tablet Take one to two po q 6 hours prn pain (Patient taking differently: Take 50 mg by mouth as needed for moderate pain. )  . traZODone (DESYREL) 50 MG tablet Take 1 tablet (50 mg total) by mouth at bedtime.  . Vitamin D, Ergocalciferol, (DRISDOL) 50000 units CAPS capsule Take 50,000 Units by mouth every Sunday.      Allergies:   Codeine, Iodine, and Shellfish allergy   Social History   Socioeconomic History  . Marital status: Married    Spouse name: Not on file  . Number of children: Not on file  . Years of education: Not on file  . Highest education level: Not on file  Occupational  History  . Not on file  Tobacco Use  . Smoking status: Never Smoker  . Smokeless tobacco: Never Used  Vaping Use  . Vaping Use: Never used  Substance and Sexual Activity  . Alcohol use: No  . Drug use: No  . Sexual activity: Not on file  Other Topics Concern  . Not on file  Social History Narrative  . Not on file   Social Determinants of Health   Financial Resource Strain:   . Difficulty of Paying Living Expenses: Not on file  Food Insecurity:   . Worried About Running Out of Food in the Last Year: Not on file  . Ran Out of Food in the Last Year: Not on file  Transportation Needs:   . Lack of Transportation (Medical): Not on file  . Lack of Transportation (Non-Medical): Not on file  Physical Activity:   . Days of   Exercise per Week: Not on file  . Minutes of Exercise per Session: Not on file  Stress:   . Feeling of Stress : Not on file  Social Connections:   . Frequency of Communication with Friends and Family: Not on file  . Frequency of Social Gatherings with Friends and Family: Not on file  . Attends Religious Services: Not on file  . Active Member of Clubs or Organizations: Not on file  . Attends Banker Meetings: Not on file  . Marital Status: Not on file     Family History: The patient's family history includes Heart disease in her father and mother.  ROS:   Please see the history of present illness.    All other systems reviewed and are negative.  EKGs/Labs/Other Studies Reviewed:    The following studies were reviewed today: LEs she needs a cath so she will need an EKG   Recent Labs: 07/28/2019: ALT 19; BUN 12; Creatinine, Ser 0.99; Hemoglobin 14.7; Platelets 226; Potassium 4.8; Sodium 142; TSH 3.470  Recent Lipid Panel    Component Value Date/Time   CHOL 168 07/28/2019 1131   TRIG 120 07/28/2019 1131   HDL 50 07/28/2019 1131   CHOLHDL 3.4 07/28/2019 1131   LDLCALC 97 07/28/2019 1131    Physical Exam:    VS:  BP 136/78   Pulse (!)  108   Ht 5\' 3"  (1.6 m)   Wt 202 lb 12.8 oz (92 kg)   SpO2 96%   BMI 35.92 kg/m     Wt Readings from Last 3 Encounters:  01/14/20 202 lb 12.8 oz (92 kg)  06/16/19 207 lb (93.9 kg)  03/11/19 224 lb (101.6 kg)     GEN: Patient is in no acute distress HEENT: Normal NECK: No JVD; No carotid bruits LYMPHATICS: No lymphadenopathy CARDIAC: Hear sounds regular, 2/6 systolic murmur at the apex. RESPIRATORY:  Clear to auscultation without rales, wheezing or rhonchi  ABDOMEN: Soft, non-tender, non-distended MUSCULOSKELETAL:  No edema; No deformity  SKIN: Warm and dry NEUROLOGIC:  Alert and oriented x 3 PSYCHIATRIC:  Normal affect   Signed, 13/03/20, MD  01/14/2020 4:27 PM    [facility]

## 2020-01-14 NOTE — H&P (View-Only) (Signed)
Cardiology Office Note:    Date:  01/14/2020   ID:  Deborah Farley, DOB 04-09-1946, MRN [id number]  PCP:  Abigail Miyamoto, MD  Cardiologist:  Garwin Brothers, MD   Referring MD: Abigail Miyamoto,*    ASSESSMENT:    1. Coronary-myocardial bridge   2. Mixed hyperlipidemia   3. Essential hypertension   4. Diet-controlled diabetes mellitus (HCC)   5. Angina pectoris (HCC)    PLAN:    In order of problems listed above:  1. Angina pectoris:In view of the patient's symptoms, I discussed with the patient options for evaluation. Invasive and noninvasive options were given to the patient. I discussed stress testing and coronary angiography and left heart catheterization at length. Benefits, pros and cons of each approach were discussed at length. Patient had multiple questions which were answered to the patient's satisfaction. Patient opted for invasive evaluation and we will set up for coronary angiography and left heart catheterization. Further recommendations will be made based on the findings with coronary angiography. In the interim if the patient has any significant symptoms in hospital to the nearest emergency room.  EKG done today reveals sinus rhythm and nonspecific ST-T changes. 2. The patient's symptoms are concerning.  Sublingual nitroglycerin prescription was sent, its protocol and 911 protocol explained and the patient vocalized understanding questions were answered to the patient's satisfaction 3. Essential hypertension: Blood pressure stable 4. Mixed dyslipidemia diabetes mellitus: Diet was emphasized.  Weight reduction was stressed she promises to do so.   Medication Adjustments/Labs and Tests Ordered: Current medicines are reviewed at length with the patient today.  Concerns regarding medicines are outlined above.  No orders of the defined types were placed in this encounter.  No orders of the defined types were placed in this encounter.    No chief complaint  on file.    History of Present Illness:    Deborah Farley is a 74 y.o. female.  Patient has past medical history of coronary artery disease, myocardial bridge essential hypertension and dyslipidemia.  She mentions to me that she has chest tightness on exertion consistently when she starts exerting or when she is under stress.  No chest pain orthopnea or PND.  This is of significant concern to her.  She is very worried about the symptoms.  At the time of my evaluation, the patient is alert awake oriented and in no distress.  Past Medical History:  Diagnosis Date  . Depression   . Diabetes mellitus without complication (HCC)   . Heart disease   . Hernia, hiatal   . High cholesterol   . Hypertension   . Hypothyroidism   . Memory difficulty   . Spastic esophagus     Past Surgical History:  Procedure Laterality Date  . ABDOMINAL HYSTERECTOMY    . BACK SURGERY    . CARDIAC CATHETERIZATION N/A 08/12/2015   Procedure: Left Heart Cath and Coronary Angiography;  Surgeon: Yates Decamp, MD;  Location: [facility] INVASIVE CV LAB;  Service: Cardiovascular;  Laterality: N/A;  . LEFT HEART CATH AND CORONARY ANGIOGRAPHY N/A 02/20/2018   Procedure: LEFT HEART CATH AND CORONARY ANGIOGRAPHY;  Surgeon: Lennette Bihari, MD;  Location: [facility] INVASIVE CV LAB;  Service: Cardiovascular;  Laterality: N/A;  . TOTAL ABDOMINAL HYSTERECTOMY W/ BILATERAL SALPINGOOPHORECTOMY      Current Medications: Current Meds  Medication Sig  . ALPRAZolam (XANAX) 0.5 MG tablet Take 0.25 mg by mouth daily as needed for anxiety.  Marland Kitchen aspirin EC 81 MG tablet Take 81  mg by mouth daily.  . bumetanide (BUMEX) 1 MG tablet Take 1 mg by mouth daily as needed (swelling).   Marland Kitchen buPROPion (WELLBUTRIN XL) 300 MG 24 hr tablet Take 300 mg by mouth at bedtime.   . Cholecalciferol (VITAMIN D) 2000 units CAPS Take 2,000 Units by mouth daily.  Marland Kitchen diltiazem (CARDIZEM CD) 120 MG 24 hr capsule Take 1 capsule (120 mg total) by mouth daily.  . diphenhydrAMINE  (BENADRYL) 50 MG tablet Take 1 tablet one hour prior to your procedure  . enalapril (VASOTEC) 20 MG tablet TAKE 1 TABLET BY MOUTH ONCE DAILY  . folic acid (FOLVITE) 400 MCG tablet Take 400 mcg by mouth daily.  Marland Kitchen latanoprost (XALATAN) 0.005 % ophthalmic solution Place 1 drop into both eyes at bedtime.  Marland Kitchen levothyroxine (SYNTHROID) 25 MCG tablet TAKE 1 TABLET BY MOUTH  DAILY  . methotrexate (RHEUMATREX) 2.5 MG tablet Take 3 tablets (7.5 mg total) by mouth once a week. (Patient taking differently: Take 10 mg by mouth every Sunday. )  . metoprolol tartrate (LOPRESSOR) 25 MG tablet Take 25 mg by mouth 2 (two) times daily.  . Multiple Vitamin (MULTIVITAMIN) capsule Take 1 capsule by mouth daily.  . nitroGLYCERIN (NITROSTAT) 0.4 MG SL tablet Place 0.4 mg under the tongue every 5 (five) minutes as needed for chest pain.   Marland Kitchen omeprazole (PRILOSEC) 20 MG capsule Take 1 capsule (20 mg total) by mouth 2 (two) times daily before a meal.  . polyethylene glycol (MIRALAX / GLYCOLAX) packet Take 17 g by mouth daily as needed for mild constipation.   . pravastatin (PRAVACHOL) 20 MG tablet TAKE ONE-HALF TABLET BY  MOUTH ONCE DAILY  . ranolazine (RANEXA) 1000 MG SR tablet Take 1 tablet (1,000 mg total) by mouth 2 (two) times daily.  . sertraline (ZOLOFT) 100 MG tablet TAKE 1 TABLET BY MOUTH ONCE DAILY  . traMADol (ULTRAM) 50 MG tablet Take one to two po q 6 hours prn pain (Patient taking differently: Take 50 mg by mouth as needed for moderate pain. )  . traZODone (DESYREL) 50 MG tablet Take 1 tablet (50 mg total) by mouth at bedtime.  . Vitamin D, Ergocalciferol, (DRISDOL) 50000 units CAPS capsule Take 50,000 Units by mouth every Sunday.      Allergies:   Codeine, Iodine, and Shellfish allergy   Social History   Socioeconomic History  . Marital status: Married    Spouse name: Not on file  . Number of children: Not on file  . Years of education: Not on file  . Highest education level: Not on file  Occupational  History  . Not on file  Tobacco Use  . Smoking status: Never Smoker  . Smokeless tobacco: Never Used  Vaping Use  . Vaping Use: Never used  Substance and Sexual Activity  . Alcohol use: No  . Drug use: No  . Sexual activity: Not on file  Other Topics Concern  . Not on file  Social History Narrative  . Not on file   Social Determinants of Health   Financial Resource Strain:   . Difficulty of Paying Living Expenses: Not on file  Food Insecurity:   . Worried About Programme researcher, broadcasting/film/video in the Last Year: Not on file  . Ran Out of Food in the Last Year: Not on file  Transportation Needs:   . Lack of Transportation (Medical): Not on file  . Lack of Transportation (Non-Medical): Not on file  Physical Activity:   . Days of  Exercise per Week: Not on file  . Minutes of Exercise per Session: Not on file  Stress:   . Feeling of Stress : Not on file  Social Connections:   . Frequency of Communication with Friends and Family: Not on file  . Frequency of Social Gatherings with Friends and Family: Not on file  . Attends Religious Services: Not on file  . Active Member of Clubs or Organizations: Not on file  . Attends Banker Meetings: Not on file  . Marital Status: Not on file     Family History: The patient's family history includes Heart disease in her father and mother.  ROS:   Please see the history of present illness.    All other systems reviewed and are negative.  EKGs/Labs/Other Studies Reviewed:    The following studies were reviewed today: LEs she needs a cath so she will need an EKG   Recent Labs: 07/28/2019: ALT 19; BUN 12; Creatinine, Ser 0.99; Hemoglobin 14.7; Platelets 226; Potassium 4.8; Sodium 142; TSH 3.470  Recent Lipid Panel    Component Value Date/Time   CHOL 168 07/28/2019 1131   TRIG 120 07/28/2019 1131   HDL 50 07/28/2019 1131   CHOLHDL 3.4 07/28/2019 1131   LDLCALC 97 07/28/2019 1131    Physical Exam:    VS:  BP 136/78   Pulse (!)  108   Ht 5\' 3"  (1.6 m)   Wt 202 lb 12.8 oz (92 kg)   SpO2 96%   BMI 35.92 kg/m     Wt Readings from Last 3 Encounters:  01/14/20 202 lb 12.8 oz (92 kg)  06/16/19 207 lb (93.9 kg)  03/11/19 224 lb (101.6 kg)     GEN: Patient is in no acute distress HEENT: Normal NECK: No JVD; No carotid bruits LYMPHATICS: No lymphadenopathy CARDIAC: Hear sounds regular, 2/6 systolic murmur at the apex. RESPIRATORY:  Clear to auscultation without rales, wheezing or rhonchi  ABDOMEN: Soft, non-tender, non-distended MUSCULOSKELETAL:  No edema; No deformity  SKIN: Warm and dry NEUROLOGIC:  Alert and oriented x 3 PSYCHIATRIC:  Normal affect   Signed, 13/03/20, MD  01/14/2020 4:27 PM    [facility]

## 2020-01-14 NOTE — Patient Instructions (Addendum)
Medication Instructions:  Your physician recommends that you continue on your current medications as directed. Please refer to the Current Medication list given to you today.  *If you need a refill on your cardiac medications before your next appointment, please call your pharmacy*   Lab Work: Your physician recommends that you have lab work today: bmet/cbc/tsh/lft  If you have labs (blood work) drawn today and your tests are completely normal, you will receive your results only by: Marland Kitchen MyChart Message (if you have MyChart) OR . A paper copy in the mail If you have any lab test that is abnormal or we need to change your treatment, we will call you to review the results.   Testing/Procedures: Your provider has recommended a cardiac catherization.   You will need a COVID test prior to your procedure - Go to Pre-Procedural COVID -19 testing at [address] in [place], [place] [address] on _________Thursday, September 9 _@ 2:40 pm ___________________________.   You are scheduled for a cardiac catheterization on Monday, September 13 with Dr. Herbie Baltimore or associate.  Please arrive at the [facility] (Main Entrance) at [facility] at [address] [place] -  2nd Floor Short Stay on Monday, September 13 at 7:00 am .  Free valet parking is available.   You are allowed only one visitor in the hospital with you. Both you and your visitor must wear masks.    Special note: Every effort is made to have your procedure done on time.   Please understand that emergencies sometimes delay a scheduled   procedure.  No solid foods after midnight on Sunday, September 12. You may have clear liquids until 5 am on the day of your procedure.  On the morning of your procedure, take your all your medications.   You may take your morning medications with a sip of water on the day of your procedure.  Please take a baby aspirin (81 mg) on the morning of your procedure.   Medications to HOLD -  BUMEX  Take one tablet prednisone (50 mg) 13 hours prior to procedure, one tablet 7 hours prior to procedure, one tablet (50 mg) one hour prior. Take benadryl one tablet (50 mg) one hour prior to test.  Plan for a one night stay -- bring personal belongings.  Bring a current list of your medications and current insurance cards.  You MUST have a responsible person to drive you home. Someone MUST be with you the first 24 hours after you arrive home or your discharge will be delayed. Wear clothes that are easy to get on and off and wear slip on shoes.    Coronary Angiogram A coronary angiogram, also called coronary angiography, is an X-ray procedure used to look at the arteries in the heart. In this procedure, a dye (contrast dye) is injected through a long, hollow tube (catheter). The catheter is about the size of a piece of cooked spaghetti and is inserted through your groin, wrist, or arm. The dye is injected into each artery, and X-rays are then taken to show if there is a blockage in the arteries of your heart.  LET [facility] CARE PROVIDER KNOW ABOUT:  Any allergies you have, including allergies to shellfish or contrast dye.    All medicines you are taking, including vitamins, herbs, eye drops, creams, and over-the-counter medicines.    Previous problems you or members of your family have had with the use of anesthetics.    Any blood disorders  you have.    Previous surgeries you have had.  History of kidney problems or failure.    Other medical conditions you have.  RISKS AND COMPLICATIONS  Generally, a coronary angiogram is a safe procedure. However, about 1 person out of 1000 can have problems that may include:  Allergic reaction to the dye.  Bleeding/bruising from the access site or other locations.  Kidney injury, especially in people with impaired kidney function.   Stroke (rare).  Heart attack (rare).  Irregular rhythms (rare)  Death (rare)  BEFORE THE PROCEDURE    Do not eat or drink anything after midnight the night before the procedure or as directed by your health care provider.    Ask your health care provider about changing or stopping your regular medicines. This is especially important if you are taking diabetes medicines or blood thinners.  PROCEDURE  You may be given a medicine to help you relax (sedative) before the procedure. This medicine is given through an intravenous (IV) access tube that is inserted into one of your veins.    The area where the catheter will be inserted will be washed and shaved. This is usually done in the groin but may be done in the fold of your arm (near your elbow) or in the wrist.     A medicine will be given to numb the area where the catheter will be inserted (local anesthetic).    The health care provider will insert the catheter into an artery. The catheter will be guided by using a special type of X-ray (fluoroscopy) of the blood vessel being examined.    A special dye will then be injected into the catheter, and X-rays will be taken. The dye will help to show where any narrowing or blockages are located in the heart arteries.     AFTER THE PROCEDURE   If the procedure is done through the leg, you will be kept in bed lying flat for several hours. You will be instructed to not bend or cross your legs.  The insertion site will be checked frequently.    The pulse in your feet or wrist will be checked frequently.    Additional blood tests, X-rays, and an electrocardiogram may be done.       Follow-Up: At [facility], you and your health needs are our priority.  As part of our continuing mission to provide you with exceptional heart care, we have created designated Provider Care Teams.  These Care Teams include your primary Cardiologist (physician) and Advanced Practice Providers (APPs -  Physician Assistants and Nurse Practitioners) who all work together to provide you with the care you need, when  you need it.  We recommend signing up for the patient portal called "MyChart".  Sign up information is provided on this After Visit Summary.  MyChart is used to connect with patients for Virtual Visits (Telemedicine).  Patients are able to view lab/test results, encounter notes, upcoming appointments, etc.  Non-urgent messages can be sent to your provider as well.   To learn more about what you can do with MyChart, go to [email or web address].    Your next appointment:   6 month(s)  The format for your next appointment:   In Person  Provider:   Belva Crome, MD   Other Instructions

## 2020-01-15 ENCOUNTER — Telehealth: Payer: Self-pay

## 2020-01-15 ENCOUNTER — Other Ambulatory Visit (HOSPITAL_COMMUNITY)
Admission: RE | Admit: 2020-01-15 | Discharge: 2020-01-15 | Disposition: A | Discharge status: Home / Self Care | Payer: Medicare Other | Source: Ambulatory Visit | Attending: Cardiology | Admitting: Cardiology

## 2020-01-15 DIAGNOSIS — Z01818 Encounter for other preprocedural examination: Secondary | ICD-10-CM | POA: Diagnosis not present

## 2020-01-15 DIAGNOSIS — Z20822 Contact with and (suspected) exposure to covid-19: Secondary | ICD-10-CM | POA: Insufficient documentation

## 2020-01-15 LAB — BASIC METABOLIC PANEL
BUN/Creatinine Ratio: 13 (ref 12–28)
BUN: 12 mg/dL (ref 8–27)
CO2: 21 mmol/L (ref 20–29)
Calcium: 9.8 mg/dL (ref 8.7–10.3)
Chloride: 107 mmol/L — ABNORMAL HIGH (ref 96–106)
Creatinine, Ser: 0.96 mg/dL (ref 0.57–1.00)
GFR calc Af Amer: 67 mL/min/{1.73_m2} (ref >59)
GFR calc non Af Amer: 58 mL/min/{1.73_m2} — ABNORMAL LOW (ref >59)
Glucose: 93 mg/dL (ref 65–99)
Potassium: 4.2 mmol/L (ref 3.5–5.2)
Sodium: 143 mmol/L (ref 134–144)

## 2020-01-15 LAB — HEPATIC FUNCTION PANEL
ALT: 25 IU/L (ref 0–32)
AST: 45 IU/L — ABNORMAL HIGH (ref 0–40)
Albumin: 4.1 g/dL (ref 3.7–4.7)
Alkaline Phosphatase: 121 IU/L (ref 48–121)
Bilirubin Total: 0.7 mg/dL (ref 0.0–1.2)
Bilirubin, Direct: 0.25 mg/dL (ref 0.00–0.40)
Total Protein: 6.9 g/dL (ref 6.0–8.5)

## 2020-01-15 LAB — CBC WITH DIFFERENTIAL/PLATELET
Basophils Absolute: 0.1 10*3/uL (ref 0.0–0.2)
Basos: 1 %
EOS (ABSOLUTE): 0.1 10*3/uL (ref 0.0–0.4)
Eos: 2 %
Hematocrit: 43.8 % (ref 34.0–46.6)
Hemoglobin: 14.5 g/dL (ref 11.1–15.9)
Immature Grans (Abs): 0 10*3/uL (ref 0.0–0.1)
Immature Granulocytes: 0 %
Lymphocytes Absolute: 2.8 10*3/uL (ref 0.7–3.1)
Lymphs: 43 %
MCH: 32.3 pg (ref 26.6–33.0)
MCHC: 33.1 g/dL (ref 31.5–35.7)
MCV: 98 fL — ABNORMAL HIGH (ref 79–97)
Monocytes Absolute: 0.4 10*3/uL (ref 0.1–0.9)
Monocytes: 6 %
Neutrophils Absolute: 3 10*3/uL (ref 1.4–7.0)
Neutrophils: 48 %
Platelets: 230 10*3/uL (ref 150–450)
RBC: 4.49 x10E6/uL (ref 3.77–5.28)
RDW: 13.3 % (ref 11.7–15.4)
WBC: 6.4 10*3/uL (ref 3.4–10.8)

## 2020-01-15 LAB — SARS CORONAVIRUS 2 (TAT 6-24 HRS): SARS Coronavirus 2: NEGATIVE

## 2020-01-15 LAB — TSH: TSH: 2.22 u[IU]/mL (ref 0.450–4.500)

## 2020-01-15 NOTE — Telephone Encounter (Signed)
Spoke with patient regarding results and recommendation.  Patient verbalizes understanding and is agreeable to plan of care. Advised patient to call back with any issues or concerns.  

## 2020-01-15 NOTE — Telephone Encounter (Signed)
-----   Message from Garwin Brothers, MD sent at 01/15/2020  8:11 AM EDT ----- Overall labs are fine liver tests are mildly elevated.  She can discuss this with her primary care physician after her.  The results of the study is unremarkable. Please inform patient. I will discuss in detail at next appointment. Cc  primary care/referring physician Garwin Brothers, MD 01/15/2020 8:11 AM

## 2020-01-15 NOTE — Telephone Encounter (Signed)
Left message for the pt to call back re:   Pt contacted pre-catheterization scheduled at [facility] for: Monday 01/19/20 Verified arrival time and place: [facility]) at: 7:00 am    No solid food after midnight prior to cath, clear liquids until 5 AM day of procedure.  AM meds can be  taken pre-cath with sips of water including: ASA 81 mg  Medications to HOLD - BUMEX and Enalapril (GFR 58)   CONTRAST ALLERGY: YES  1. Prednisone 50 mg - take 13 hours prior to test 2. Take another Prednisone 50 mg 7 hours prior to test 3. Take another Prednisone 50 mg 1 hour prior to test 4. Take Benadryl 50 mg 1 hour prior to test . Patient must complete all four doses of above prophylactic medications.   Confirmed patient has responsible adult to drive home post procedure and be with patient first 24 hours after arriving home:  You are allowed ONE visitor in the waiting room during the time you are at the hospital for your procedure. Both you and your visitor must wear a mask once you enter the hospital.  COVID-19 Pre-Screening Questions:  . In the past 10 days have you had a new cough, shortness of breath, headache, congestion, fever (100 or greater) unexplained body aches, new sore throat, or sudden loss of taste or sense of smell? Marland Kitchen In the past 10 days have you been around anyone with known Covid 19?  Marland Kitchen Have you been vaccinated for COVID-19? YES Pfizer 07/03/19 and 07/31/19

## 2020-01-15 NOTE — Addendum Note (Signed)
Addended by: Smiley Houseman B on: 01/15/2020 10:17 AM   Modules accepted: Orders

## 2020-01-15 NOTE — Addendum Note (Signed)
Addended by: Vernard Gambles on: 01/15/2020 10:38 AM   Modules accepted: Orders

## 2020-01-16 NOTE — Telephone Encounter (Signed)
Left another message for the pt to call back... her COVID test for her Cath was negative and she needs to be advised to hold her Enalapril due to GFR 58.   Pt has dye allergy. Need to be sue she understands the prophylactic protocol.   Cath Monday 01/19/20 arrival 7 am.

## 2020-01-19 ENCOUNTER — Other Ambulatory Visit: Payer: Self-pay

## 2020-01-19 ENCOUNTER — Encounter (HOSPITAL_COMMUNITY)
Admission: RE | Disposition: A | Discharge status: Home / Self Care | Payer: Self-pay | Source: Home / Self Care | Attending: Cardiology

## 2020-01-19 ENCOUNTER — Ambulatory Visit (HOSPITAL_COMMUNITY)
Admission: RE | Admit: 2020-01-19 | Discharge: 2020-01-19 | Disposition: A | Discharge status: Home / Self Care | Payer: Medicare Other | Attending: Cardiology | Admitting: Cardiology

## 2020-01-19 DIAGNOSIS — Z91013 Allergy to seafood: Secondary | ICD-10-CM | POA: Diagnosis not present

## 2020-01-19 DIAGNOSIS — Z888 Allergy status to other drugs, medicaments and biological substances status: Secondary | ICD-10-CM | POA: Diagnosis not present

## 2020-01-19 DIAGNOSIS — Z7982 Long term (current) use of aspirin: Secondary | ICD-10-CM | POA: Insufficient documentation

## 2020-01-19 DIAGNOSIS — Q245 Malformation of coronary vessels: Secondary | ICD-10-CM | POA: Diagnosis not present

## 2020-01-19 DIAGNOSIS — E782 Mixed hyperlipidemia: Secondary | ICD-10-CM | POA: Diagnosis not present

## 2020-01-19 DIAGNOSIS — Z885 Allergy status to narcotic agent status: Secondary | ICD-10-CM | POA: Diagnosis not present

## 2020-01-19 DIAGNOSIS — Z79899 Other long term (current) drug therapy: Secondary | ICD-10-CM | POA: Diagnosis not present

## 2020-01-19 DIAGNOSIS — I10 Essential (primary) hypertension: Secondary | ICD-10-CM | POA: Insufficient documentation

## 2020-01-19 DIAGNOSIS — I25110 Atherosclerotic heart disease of native coronary artery with unstable angina pectoris: Secondary | ICD-10-CM | POA: Insufficient documentation

## 2020-01-19 DIAGNOSIS — I200 Unstable angina: Secondary | ICD-10-CM | POA: Diagnosis not present

## 2020-01-19 DIAGNOSIS — F329 Major depressive disorder, single episode, unspecified: Secondary | ICD-10-CM | POA: Diagnosis not present

## 2020-01-19 DIAGNOSIS — Z79890 Hormone replacement therapy (postmenopausal): Secondary | ICD-10-CM | POA: Insufficient documentation

## 2020-01-19 DIAGNOSIS — E119 Type 2 diabetes mellitus without complications: Secondary | ICD-10-CM | POA: Diagnosis not present

## 2020-01-19 DIAGNOSIS — E039 Hypothyroidism, unspecified: Secondary | ICD-10-CM | POA: Diagnosis not present

## 2020-01-19 HISTORY — PX: LEFT HEART CATH AND CORONARY ANGIOGRAPHY: CATH118249

## 2020-01-19 SURGERY — LEFT HEART CATH AND CORONARY ANGIOGRAPHY
Anesthesia: LOCAL

## 2020-01-19 MED ORDER — LABETALOL HCL 5 MG/ML IV SOLN: 10.0000 mg | INTRAVENOUS | Status: DC | PRN

## 2020-01-19 MED ORDER — HYDRALAZINE HCL 20 MG/ML IJ SOLN: 10.0000 mg | INTRAMUSCULAR | Status: DC | PRN

## 2020-01-19 MED ORDER — SODIUM CHLORIDE 0.9 % IV SOLN: 250.0000 mL | INTRAVENOUS | Status: DC | PRN

## 2020-01-19 MED ORDER — SODIUM CHLORIDE 0.9 % WEIGHT BASED INFUSION: 1.0000 mL/kg/h | INTRAVENOUS | Status: DC

## 2020-01-19 MED ORDER — SODIUM CHLORIDE 0.9% FLUSH: 3.0000 mL | INTRAVENOUS | Status: DC | PRN

## 2020-01-19 MED ORDER — MIDAZOLAM HCL 2 MG/2ML IJ SOLN
INTRAMUSCULAR | Status: DC | PRN
  Administered 2020-01-19: 2 mg via INTRAVENOUS

## 2020-01-19 MED ORDER — ASPIRIN 81 MG PO CHEW: 81.0000 mg | CHEWABLE_TABLET | ORAL | Status: DC

## 2020-01-19 MED ORDER — SODIUM CHLORIDE 0.9 % WEIGHT BASED INFUSION: 3.0000 mL/kg/h | INTRAVENOUS | Status: AC

## 2020-01-19 MED ORDER — HEPARIN (PORCINE) IN NACL 1000-0.9 UT/500ML-% IV SOLN
INTRAVENOUS | Status: DC | PRN
  Administered 2020-01-19: 500 mL

## 2020-01-19 MED ORDER — MIDAZOLAM HCL 2 MG/2ML IJ SOLN
INTRAMUSCULAR | Status: AC
  Filled 2020-01-19: qty 2

## 2020-01-19 MED ORDER — LIDOCAINE HCL (PF) 1 % IJ SOLN
INTRAMUSCULAR | Status: DC | PRN
  Administered 2020-01-19: 2 mL

## 2020-01-19 MED ORDER — VERAPAMIL HCL 2.5 MG/ML IV SOLN
INTRAVENOUS | Status: DC | PRN
  Administered 2020-01-19: 10 mL via INTRA_ARTERIAL

## 2020-01-19 MED ORDER — HEPARIN SODIUM (PORCINE) 1000 UNIT/ML IJ SOLN
INTRAMUSCULAR | Status: DC | PRN
  Administered 2020-01-19: 5000 [IU] via INTRAVENOUS

## 2020-01-19 MED ORDER — FENTANYL CITRATE (PF) 100 MCG/2ML IJ SOLN
INTRAMUSCULAR | Status: DC | PRN
Start: 2020-01-19 — End: 2020-01-19
  Administered 2020-01-19: 25 ug via INTRAVENOUS

## 2020-01-19 MED ORDER — HEPARIN SODIUM (PORCINE) 1000 UNIT/ML IJ SOLN
INTRAMUSCULAR | Status: AC
  Filled 2020-01-19: qty 1

## 2020-01-19 MED ORDER — SODIUM CHLORIDE 0.9% FLUSH: 3.0000 mL | Freq: Two times a day (BID) | INTRAVENOUS | Status: DC

## 2020-01-19 MED ORDER — HEPARIN (PORCINE) IN NACL 1000-0.9 UT/500ML-% IV SOLN
INTRAVENOUS | Status: AC
  Filled 2020-01-19: qty 1000

## 2020-01-19 MED ORDER — IOHEXOL 350 MG/ML SOLN
INTRAVENOUS | Status: DC | PRN
  Administered 2020-01-19: 40 mL

## 2020-01-19 MED ORDER — ONDANSETRON HCL 4 MG/2ML IJ SOLN: 4.0000 mg | Freq: Four times a day (QID) | INTRAMUSCULAR | Status: DC | PRN

## 2020-01-19 MED ORDER — SODIUM CHLORIDE 0.9 % IV SOLN: INTRAVENOUS | Status: DC

## 2020-01-19 MED ORDER — LIDOCAINE HCL (PF) 1 % IJ SOLN
INTRAMUSCULAR | Status: AC
  Filled 2020-01-19: qty 30

## 2020-01-19 MED ORDER — FENTANYL CITRATE (PF) 100 MCG/2ML IJ SOLN
INTRAMUSCULAR | Status: AC
  Filled 2020-01-19: qty 2

## 2020-01-19 MED ORDER — ACETAMINOPHEN 325 MG PO TABS: 650.0000 mg | ORAL_TABLET | ORAL | Status: DC | PRN

## 2020-01-19 MED ORDER — VERAPAMIL HCL 2.5 MG/ML IV SOLN
INTRAVENOUS | Status: AC
  Filled 2020-01-19: qty 2

## 2020-01-19 SURGICAL SUPPLY — 10 items
CATH OPTITORQUE TIG 4.0 5F (CATHETERS) ×2 IMPLANT
DEVICE RAD COMP TR BAND LRG (VASCULAR PRODUCTS) ×2 IMPLANT
GLIDESHEATH SLEND SS 6F .021 (SHEATH) ×2 IMPLANT
GUIDEWIRE INQWIRE 1.5J.035X260 (WIRE) ×1 IMPLANT
INQWIRE 1.5J .035X260CM (WIRE) ×2
KIT HEART LEFT (KITS) ×2 IMPLANT
PACK CARDIAC CATHETERIZATION (CUSTOM PROCEDURE TRAY) ×2 IMPLANT
SHEATH PROBE COVER 6X72 (BAG) ×2 IMPLANT
TRANSDUCER W/STOPCOCK (MISCELLANEOUS) ×2 IMPLANT
TUBING CIL FLEX 10 FLL-RA (TUBING) ×2 IMPLANT

## 2020-01-19 NOTE — Interval H&P Note (Signed)
History and Physical Interval Note:  01/19/2020 9:03 AM  Deborah Farley  has presented today for surgery, with the diagnosis of unstable angina.  The various methods of treatment have been discussed with the patient and family. After consideration of risks, benefits and other options for treatment, the patient has consented to  Procedure(s): LEFT HEART CATH AND CORONARY ANGIOGRAPHY (N/A)  PERCUTANEOUS CORONARY INTERVENTION   as a surgical intervention.  The patient's history has been reviewed, patient examined, no change in status, stable for surgery.  I have reviewed the patient's chart and labs.  Questions were answered to the patient's satisfaction.    Cath Lab Visit (complete for each Cath Lab visit)  Clinical Evaluation Leading to the Procedure:   ACS: No.  Non-ACS:    Anginal Classification: CCS III  Anti-ischemic medical therapy: Maximal Therapy (2 or more classes of medications)  Non-Invasive Test Results: No non-invasive testing performed  Prior CABG: No previous CABG  Bryan Lemma

## 2020-01-19 NOTE — Discharge Instructions (Signed)
Remember to keep right arm elevated above level of heart x24 hours Drink plenty of liquids for next 1-2 days Radial Site Care  This sheet gives you information about how to care for yourself after your procedure. Your health care provider may also give you more specific instructions. If you have problems or questions, contact your health care provider. What can I expect after the procedure? After the procedure, it is common to have:  Bruising and tenderness at the catheter insertion area. Follow these instructions at home: Medicines  Take over-the-counter and prescription medicines only as told by your health care provider. Insertion site care  Follow instructions from your health care provider about how to take care of your insertion site. Make sure you: ? Wash your hands with soap and water before you change your bandage (dressing). If soap and water are not available, use hand sanitizer. ? Change your dressing as told by your health care provider. ? Leave stitches (sutures), skin glue, or adhesive strips in place. These skin closures may need to stay in place for 2 weeks or longer. If adhesive strip edges start to loosen and curl up, you may trim the loose edges. Do not remove adhesive strips completely unless your health care provider tells you to do that.  Check your insertion site every day for signs of infection. Check for: ? Redness, swelling, or pain. ? Fluid or blood. ? Pus or a bad smell. ? Warmth.  Do not take baths, swim, or use a hot tub until your health care provider approves.  You may shower 24-48 hours after the procedure, or as directed by your health care provider. ? Remove the dressing and gently wash the site with plain soap and water. ? Pat the area dry with a clean towel. ? Do not rub the site. That could cause bleeding.  Do not apply powder or lotion to the site. Activity   For 24 hours after the procedure, or as directed by your health care  provider: ? Do not flex or bend the affected arm. ? Do not push or pull heavy objects with the affected arm. ? Do not drive yourself home from the hospital or clinic. You may drive 24 hours after the procedure unless your health care provider tells you not to. ? Do not operate machinery or power tools.  Do not lift anything that is heavier than 10 lb (4.5 kg), or the limit that you are told, until your health care provider says that it is safe.  Ask your health care provider when it is okay to: ? Return to work or school. ? Resume usual physical activities or sports. ? Resume sexual activity. General instructions  If the catheter site starts to bleed, raise your arm and put firm pressure on the site. If the bleeding does not stop, get help right away. This is a medical emergency.  If you went home on the same day as your procedure, a responsible adult should be with you for the first 24 hours after you arrive home.  Keep all follow-up visits as told by your health care provider. This is important. Contact a health care provider if:  You have a fever.  You have redness, swelling, or yellow drainage around your insertion site. Get help right away if:  You have unusual pain at the radial site.  The catheter insertion area swells very fast.  The insertion area is bleeding, and the bleeding does not stop when you hold steady pressure on  the area.  Your arm or hand becomes pale, cool, tingly, or numb. These symptoms may represent a serious problem that is an emergency. Do not wait to see if the symptoms will go away. Get medical help right away. Call your local emergency services (911 in the [place] Do not drive yourself to the hospital. Summary  After the procedure, it is common to have bruising and tenderness at the site.  Follow instructions from your health care provider about how to take care of your radial site wound. Check the wound every day for signs of infection.  Do not  lift anything that is heavier than 10 lb (4.5 kg), or the limit that you are told, until your health care provider says that it is safe. This information is not intended to replace advice given to you by your health care provider. Make sure you discuss any questions you have with your health care provider. Document Revised: 05/30/2017 Document Reviewed: 05/30/2017 Elsevier Patient Education  2020 [employer].

## 2020-01-20 ENCOUNTER — Encounter (HOSPITAL_COMMUNITY): Payer: Self-pay | Admitting: Cardiology

## 2020-03-11 ENCOUNTER — Other Ambulatory Visit: Payer: Self-pay | Admitting: Legal Medicine

## 2020-03-19 ENCOUNTER — Telehealth (INDEPENDENT_AMBULATORY_CARE_PROVIDER_SITE_OTHER): Payer: Medicare Other | Admitting: Legal Medicine

## 2020-03-19 ENCOUNTER — Encounter: Payer: Self-pay | Admitting: Legal Medicine

## 2020-03-19 VITALS — Temp 99.1°F | Ht 63.0 in | Wt 190.0 lb

## 2020-03-19 DIAGNOSIS — J019 Acute sinusitis, unspecified: Secondary | ICD-10-CM

## 2020-03-19 DIAGNOSIS — M0609 Rheumatoid arthritis without rheumatoid factor, multiple sites: Secondary | ICD-10-CM | POA: Diagnosis not present

## 2020-03-19 HISTORY — DX: Acute sinusitis, unspecified: J01.90

## 2020-03-19 MED ORDER — PREDNISONE 50 MG PO TABS: ORAL_TABLET | ORAL | 0 refills | Status: DC

## 2020-03-19 MED ORDER — AMOXICILLIN 875 MG PO TABS: 875.0000 mg | ORAL_TABLET | Freq: Two times a day (BID) | ORAL | 0 refills | Status: DC

## 2020-03-19 NOTE — Progress Notes (Signed)
Virtual Visit via Telephone Note   This visit type was conducted due to national recommendations for restrictions regarding the COVID-19 Pandemic (e.g. social distancing) in an effort to limit this patient's exposure and mitigate transmission in our community.  Due to her co-morbid illnesses, this patient is at least at moderate risk for complications without adequate follow up.  This format is felt to be most appropriate for this patient at this time.  The patient did not have access to video technology/had technical difficulties with video requiring transitioning to audio format only (telephone).  All issues noted in this document were discussed and addressed.  No physical exam could be performed with this format.  Patient verbally consented to a telehealth visit.   Date:  03/19/2020   ID:  Deborah Farley, DOB April 11, 1946, MRN [id number]  Patient Location: Home Provider Location: Office/Clinic  PCP:  Abigail Miyamoto, MD   Evaluation Performed:  New Patient Evaluation  Chief Complaint:  Cough congestion 3 days.  headache  History of Present Illness:    Deborah Farley is a 74 y.o. female with Cough congestion 3 days.  Headache. She just got back from [place].  She had 3 covid shots last 2 weeks ago. No ankle swelling.    Patient has chronic RA and is having joint flair, may need steroids  The patient does not have symptoms concerning for COVID-19 infection (fever, chills, cough, or new shortness of breath).    Past Medical History:  Diagnosis Date  . Depression   . Diabetes mellitus without complication (HCC)   . Heart disease   . Hernia, hiatal   . High cholesterol   . Hypertension   . Hypothyroidism   . Memory difficulty   . Spastic esophagus     Past Surgical History:  Procedure Laterality Date  . ABDOMINAL HYSTERECTOMY    . BACK SURGERY    . CARDIAC CATHETERIZATION N/A 08/12/2015   Procedure: Left Heart Cath and Coronary Angiography;  Surgeon: Yates Decamp, MD;   Location: [facility] INVASIVE CV LAB;  Service: Cardiovascular;  Laterality: N/A;  . LEFT HEART CATH AND CORONARY ANGIOGRAPHY N/A 02/20/2018   Procedure: LEFT HEART CATH AND CORONARY ANGIOGRAPHY;  Surgeon: Lennette Bihari, MD;  Location: [facility] INVASIVE CV LAB;  Service: Cardiovascular;  Laterality: N/A;  . LEFT HEART CATH AND CORONARY ANGIOGRAPHY N/A 01/19/2020   Procedure: LEFT HEART CATH AND CORONARY ANGIOGRAPHY;  Surgeon: Marykay Lex, MD;  Location: [facility] INVASIVE CV LAB;  Service: Cardiovascular;  Laterality: N/A;  . TOTAL ABDOMINAL HYSTERECTOMY W/ BILATERAL SALPINGOOPHORECTOMY      Family History  Problem Relation Age of Onset  . Heart disease Mother   . Heart disease Father     Social History   Socioeconomic History  . Marital status: Married    Spouse name: Not on file  . Number of children: Not on file  . Years of education: Not on file  . Highest education level: Not on file  Occupational History  . Not on file  Tobacco Use  . Smoking status: Never Smoker  . Smokeless tobacco: Never Used  Vaping Use  . Vaping Use: Never used  Substance and Sexual Activity  . Alcohol use: No  . Drug use: No  . Sexual activity: Not on file  Other Topics Concern  . Not on file  Social History Narrative  . Not on file   Social Determinants of Health   Financial Resource Strain:   . Difficulty of Paying Living Expenses:  Not on file  Food Insecurity:   . Worried About Programme researcher, broadcasting/film/video in the Last Year: Not on file  . Ran Out of Food in the Last Year: Not on file  Transportation Needs:   . Lack of Transportation (Medical): Not on file  . Lack of Transportation (Non-Medical): Not on file  Physical Activity:   . Days of Exercise per Week: Not on file  . Minutes of Exercise per Session: Not on file  Stress:   . Feeling of Stress : Not on file  Social Connections:   . Frequency of Communication with Friends and Family: Not on file  . Frequency of Social Gatherings with Friends and  Family: Not on file  . Attends Religious Services: Not on file  . Active Member of Clubs or Organizations: Not on file  . Attends Banker Meetings: Not on file  . Marital Status: Not on file  Intimate Partner Violence:   . Fear of Current or Ex-Partner: Not on file  . Emotionally Abused: Not on file  . Physically Abused: Not on file  . Sexually Abused: Not on file    Outpatient Medications Prior to Visit  Medication Sig Dispense Refill  . ALPRAZolam (XANAX) 0.5 MG tablet Take 0.25 mg by mouth daily as needed for anxiety.    Marland Kitchen aspirin EC 81 MG tablet Take 81 mg by mouth daily.    . bumetanide (BUMEX) 1 MG tablet Take 1 mg by mouth daily as needed (swelling).     Marland Kitchen buPROPion (WELLBUTRIN XL) 300 MG 24 hr tablet Take 300 mg by mouth at bedtime.     . Cholecalciferol (VITAMIN D) 2000 units CAPS Take 2,000 Units by mouth daily.    . diclofenac Sodium (VOLTAREN) 1 % GEL 4 (four) times a day as needed.    . diltiazem (CARDIZEM CD) 120 MG 24 hr capsule Take 1 capsule (120 mg total) by mouth daily. 90 capsule 3  . diphenhydrAMINE (BENADRYL) 50 MG tablet Take 1 tablet one hour prior to your procedure 30 tablet 0  . enalapril (VASOTEC) 20 MG tablet TAKE 1 TABLET BY MOUTH ONCE DAILY 90 tablet 2  . folic acid (FOLVITE) 400 MCG tablet Take 400 mcg by mouth daily.    Marland Kitchen latanoprost (XALATAN) 0.005 % ophthalmic solution Place 1 drop into both eyes at bedtime.    Marland Kitchen levothyroxine (SYNTHROID) 25 MCG tablet TAKE 1 TABLET BY MOUTH  DAILY 90 tablet 2  . methotrexate (RHEUMATREX) 2.5 MG tablet Take 3 tablets (7.5 mg total) by mouth once a week. (Patient taking differently: Take 25 mg by mouth every Sunday. ) 36 tablet 0  . metoprolol tartrate (LOPRESSOR) 25 MG tablet Take 25 mg by mouth 2 (two) times daily.    . Multiple Vitamin (MULTIVITAMIN) capsule Take 1 capsule by mouth daily.    . nitroGLYCERIN (NITROSTAT) 0.4 MG SL tablet Place 1 tablet (0.4 mg total) under the tongue every 5 (five) minutes  as needed for chest pain. 25 tablet 0  . omeprazole (PRILOSEC) 20 MG capsule Take 1 capsule (20 mg total) by mouth 2 (two) times daily before a meal. 180 capsule 2  . polyethylene glycol (MIRALAX / GLYCOLAX) packet Take 17 g by mouth daily as needed for mild constipation.     . pravastatin (PRAVACHOL) 20 MG tablet TAKE ONE-HALF TABLET BY  MOUTH ONCE DAILY 45 tablet 3  . ranolazine (RANEXA) 1000 MG SR tablet Take 1 tablet (1,000 mg total) by mouth 2 (  two) times daily. 180 tablet 1  . sertraline (ZOLOFT) 100 MG tablet TAKE 1 TABLET BY MOUTH ONCE DAILY 90 tablet 3  . traMADol (ULTRAM) 50 MG tablet Take one to two po q 6 hours prn pain (Patient taking differently: Take 50 mg by mouth as needed for moderate pain. ) 24 tablet 0  . traZODone (DESYREL) 50 MG tablet Take 1 tablet (50 mg total) by mouth at bedtime. 90 tablet 1  . Vitamin D, Ergocalciferol, (DRISDOL) 50000 units CAPS capsule Take 50,000 Units by mouth every Sunday.     . predniSONE (DELTASONE) 50 MG tablet Take one tablet 13 hours prior,take one tablet 7 hours prior,take last tablet 1 hour prior for procedure. 3 tablet 0   No facility-administered medications prior to visit.    Allergies:   Codeine, Iodine, and Shellfish allergy   Social History   Tobacco Use  . Smoking status: Never Smoker  . Smokeless tobacco: Never Used  Vaping Use  . Vaping Use: Never used  Substance Use Topics  . Alcohol use: No  . Drug use: No     Review of Systems  Constitutional: Negative for chills and fever.  HENT: Positive for congestion.   Eyes: Negative.   Respiratory: Positive for cough and sputum production.   Cardiovascular: Negative for chest pain, palpitations and orthopnea.  Neurological: Negative for dizziness and headaches.     Labs/Other Tests and Data Reviewed:    Recent Labs: 01/14/2020: ALT 25; BUN 12; Creatinine, Ser 0.96; Hemoglobin 14.5; Platelets 230; Potassium 4.2; Sodium 143; TSH 2.220   Recent Lipid Panel Lab Results   Component Value Date/Time   CHOL 168 07/28/2019 11:31 AM   TRIG 120 07/28/2019 11:31 AM   HDL 50 07/28/2019 11:31 AM   CHOLHDL 3.4 07/28/2019 11:31 AM   LDLCALC 97 07/28/2019 11:31 AM    Wt Readings from Last 3 Encounters:  03/19/20 190 lb (86.2 kg)  01/19/20 202 lb (91.6 kg)  01/14/20 202 lb 12.8 oz (92 kg)     Objective:    Vital Signs:  Temp 99.1 F (37.3 C)   Ht 5\' 3"  (1.6 m)   Wt 190 lb (86.2 kg)   BMI 33.66 kg/m    Physical Exam v stable  ASSESSMENT & PLAN:   1. Rheumatoid arthritis of multiple sites with negative rheumatoid factor (HCC) - predniSONE (DELTASONE) 50 MG tablet; Take one tablet 13 hours prior,take one tablet 7 hours prior,take last tablet 1 hour prior for procedure.  Dispense: 3 tablet; Refill: 0 - amoxicillin (AMOXIL) 875 MG tablet; Take 1 tablet (875 mg total) by mouth 2 (two) times daily.  Dispense: 20 tablet; Refill: 0  2. Acute non-recurrent sinusitis, unspecified  Treat with amoxicillin and prednisone.  She is in [place] now.       Meds ordered this encounter  Medications  . predniSONE (DELTASONE) 50 MG tablet    Sig: Take one tablet 13 hours prior,take one tablet 7 hours prior,take last tablet 1 hour prior for procedure.    Dispense:  3 tablet    Refill:  0  . amoxicillin (AMOXIL) 875 MG tablet    Sig: Take 1 tablet (875 mg total) by mouth 2 (two) times daily.    Dispense:  20 tablet    Refill:  0    COVID-19 Education: The signs and symptoms of COVID-19 were discussed with the patient and how to seek care for testing (follow up with PCP or arrange E-visit). The importance of social distancing  was discussed today.  Time:   Today, I have spent 20 minutes with the patient with telehealth technology discussing the above problems, as well as Preparing to see the patient (eg, review of tests). Obtaining and/or reviewing separately obtained history. Performing a medically appropriate examination and/or evaluation. Counseling and educating  the patient/family/caregiver. Ordering medications, tests, or procedures.  Documenting clinical information in the electronic or other health record. Independently interpreting results (not separately reported) and communicating results to the patient/family/caregiver.   My nursing staff have aided in the documentation of this note on the behalf of Brent Bulla, MD,as directed by  Brent Bulla, MD and thoroughly reviewed by Brent Bulla, MD.  Follow Up:  In Person prn  Signed,  Brent Bulla, MD  03/19/2020 11:48 AM    [facility]

## 2020-03-22 ENCOUNTER — Telehealth: Payer: Medicare Other | Admitting: Legal Medicine

## 2020-04-01 ENCOUNTER — Other Ambulatory Visit: Payer: Self-pay | Admitting: Legal Medicine

## 2020-04-08 ENCOUNTER — Other Ambulatory Visit: Payer: Self-pay | Admitting: Cardiology

## 2020-04-08 NOTE — Telephone Encounter (Signed)
Rx refill sent to pharmacy. 

## 2020-05-06 DIAGNOSIS — Z20822 Contact with and (suspected) exposure to covid-19: Secondary | ICD-10-CM | POA: Diagnosis not present

## 2020-05-17 ENCOUNTER — Other Ambulatory Visit: Payer: Self-pay | Admitting: Legal Medicine

## 2020-07-15 DIAGNOSIS — E119 Type 2 diabetes mellitus without complications: Secondary | ICD-10-CM | POA: Insufficient documentation

## 2020-07-15 DIAGNOSIS — R413 Other amnesia: Secondary | ICD-10-CM | POA: Insufficient documentation

## 2020-07-15 DIAGNOSIS — I519 Heart disease, unspecified: Secondary | ICD-10-CM | POA: Insufficient documentation

## 2020-07-15 DIAGNOSIS — K224 Dyskinesia of esophagus: Secondary | ICD-10-CM | POA: Insufficient documentation

## 2020-07-15 DIAGNOSIS — K449 Diaphragmatic hernia without obstruction or gangrene: Secondary | ICD-10-CM | POA: Insufficient documentation

## 2020-07-15 DIAGNOSIS — E7800 Pure hypercholesterolemia, unspecified: Secondary | ICD-10-CM | POA: Insufficient documentation

## 2020-07-16 ENCOUNTER — Other Ambulatory Visit: Payer: Self-pay

## 2020-07-16 ENCOUNTER — Ambulatory Visit: Payer: Medicare HMO | Admitting: Cardiology

## 2020-07-16 ENCOUNTER — Encounter: Payer: Self-pay | Admitting: Cardiology

## 2020-07-16 VITALS — BP 116/66 | HR 66 | Ht 63.0 in | Wt 199.6 lb

## 2020-07-16 DIAGNOSIS — I25110 Atherosclerotic heart disease of native coronary artery with unstable angina pectoris: Secondary | ICD-10-CM

## 2020-07-16 DIAGNOSIS — E039 Hypothyroidism, unspecified: Secondary | ICD-10-CM | POA: Diagnosis not present

## 2020-07-16 DIAGNOSIS — E782 Mixed hyperlipidemia: Secondary | ICD-10-CM

## 2020-07-16 DIAGNOSIS — I10 Essential (primary) hypertension: Secondary | ICD-10-CM

## 2020-07-16 MED ORDER — NITROGLYCERIN 0.4 MG SL SUBL: 0.4000 mg | SUBLINGUAL_TABLET | SUBLINGUAL | 0 refills | Status: DC | PRN

## 2020-07-16 NOTE — Patient Instructions (Signed)
Medication Instructions:  Your physician has recommended you make the following change in your medication:   Stop Enalapril  *If you need a refill on your cardiac medications before your next appointment, please call your pharmacy*   Lab Work: Your physician recommends that you return for lab work in: the next few days. You need to have labs done when you are fasting.  You can come Monday through Friday 8:30 am to 12:00 pm and 1:15 to 4:30. You do not need to make an appointment as the order has already been placed. The labs you are going to have done are BMET, CBC, TSH, LFT and Lipids.  If you have labs (blood work) drawn today and your tests are completely normal, you will receive your results only by: Marland Kitchen MyChart Message (if you have MyChart) OR . A paper copy in the mail If you have any lab test that is abnormal or we need to change your treatment, we will call you to review the results.   Testing/Procedures: None ordered   Follow-Up: At [facility], you and your health needs are our priority.  As part of our continuing mission to provide you with exceptional heart care, we have created designated Provider Care Teams.  These Care Teams include your primary Cardiologist (physician) and Advanced Practice Providers (APPs -  Physician Assistants and Nurse Practitioners) who all work together to provide you with the care you need, when you need it.  We recommend signing up for the patient portal called "MyChart".  Sign up information is provided on this After Visit Summary.  MyChart is used to connect with patients for Virtual Visits (Telemedicine).  Patients are able to view lab/test results, encounter notes, upcoming appointments, etc.  Non-urgent messages can be sent to your provider as well.   To learn more about what you can do with MyChart, go to [email or web address].    Your next appointment:   6 month(s)  The format for your next appointment:   In Person  Provider:    Belva Crome, MD   Other Instructions NA

## 2020-07-16 NOTE — Progress Notes (Signed)
Cardiology Office Note:    Date:  07/16/2020   ID:  Deborah Farley, DOB Nov 26, 1945, MRN [id number]  PCP:  Abigail Miyamoto, MD  Cardiologist:  Garwin Brothers, MD   Referring MD: Abigail Miyamoto,*    ASSESSMENT:    No diagnosis found. PLAN:    In order of problems listed above:  1. Coronary artery disease: Secondary prevention stressed with patient.  Importance of compliance with diet medication stressed and she vocalized understanding.  She was advised to walk at least half an hour a day on a daily basis.  She currently leads a sedentary lifestyle and I cautioned her against this. 2. Essential hypertension: Blood pressure stable and diet was emphasized.  Lifestyle modification was urged. 3. Mixed dyslipidemia: I told her to come back in the next few days for blood work to assess this.  Last blood work was reviewed from [facility].  Also weight reduction was stressed and risks of obesity emphasized. 4. Orthostatic hypotension: Patient has diet-controlled diabetes mellitus.  But in view of her symptoms have asked her to hold her losartan and she is agreeable.  She will let us know in 8 to 10 days how she feels.  Her symptoms are concerning and I am concerned about a fall. 5. Patient will be seen in follow-up appointment in 6 months or earlier if the patient has any concerns    Medication Adjustments/Labs and Tests Ordered: Current medicines are reviewed at length with the patient today.  Concerns regarding medicines are outlined above.  No orders of the defined types were placed in this encounter.  No orders of the defined types were placed in this encounter.    No chief complaint on file.    History of Present Illness:    Deborah Farley is a 75 y.o. female.  Patient has past medical history of coronary artery disease and myocardial bridge.  She has history of essential hypertension dyslipidemia.  She denies any problems at this time and takes care of activities of  daily living.  No chest pain orthopnea or PND.  Her only symptoms seem to be suggestive of postural hypotension.  At the time of my evaluation, the patient is alert awake oriented and in no distress.  Past Medical History:  Diagnosis Date  . Acid reflux 02/29/2016  . Acute sinusitis 03/19/2020  . Chronic idiopathic constipation 07/02/2015  . Chronic systolic congestive heart failure (HCC) 07/02/2015  . Coronary artery disease involving native coronary artery of native heart with unstable angina pectoris (HCC) 07/02/2015   Formatting of this note might be different from the original. [place]  . 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 Depression   . Diabetes (HCC) 03/01/2016  . Diabetes mellitus without complication (HCC)   . Diet-controlled diabetes mellitus (HCC) 08/11/2015  . Dyspnea on exertion 01/16/2019  . Epigastric pain 04/08/2015  . Gait disturbance 03/15/2017  . Heart disease   . Hernia, hiatal   . Hiatal hernia 07/02/2015  . High cholesterol   . High risk medication use 04/12/2016   Methotrexate PLQ Eye Exam: 07/28/16 WNL @ [facility] Follow up in 6 months.   . History of diabetes mellitus 05/31/2016  . History of gastroesophageal reflux (GERD) 05/31/2016  . History of hypothyroidism 05/31/2016  . Hyperlipidemia 02/29/2016  . Hypertension   . Hypothyroidism   . Malaise and fatigue 07/02/2015  . Memory difficulty   . Moderate episode of recurrent major depressive disorder (HCC) 07/02/2015  . Obesity (BMI 35.0-39.9 without comorbidity)  02/29/2016  . Progressive angina (HCC) 08/11/2015  . Rheumatoid arthritis (HCC) 04/12/2016  . Spastic esophagus   . Suspected pulmonary embolism 03/11/2019    Past Surgical History:  Procedure Laterality Date  . ABDOMINAL HYSTERECTOMY    . BACK SURGERY    . CARDIAC CATHETERIZATION N/A 08/12/2015   Procedure: Left Heart Cath and Coronary Angiography;  Surgeon: Yates Decamp, MD;  Location: [facility] INVASIVE CV LAB;  Service: Cardiovascular;  Laterality: N/A;   . LEFT HEART CATH AND CORONARY ANGIOGRAPHY N/A 02/20/2018   Procedure: LEFT HEART CATH AND CORONARY ANGIOGRAPHY;  Surgeon: Lennette Bihari, MD;  Location: [facility] INVASIVE CV LAB;  Service: Cardiovascular;  Laterality: N/A;  . LEFT HEART CATH AND CORONARY ANGIOGRAPHY N/A 01/19/2020   Procedure: LEFT HEART CATH AND CORONARY ANGIOGRAPHY;  Surgeon: Marykay Lex, MD;  Location[facility] INVASIVE CV LAB;  Service: Cardiovascular;  Laterality: N/A;  . TOTAL ABDOMINAL HYSTERECTOMY W/ BILATERAL SALPINGOOPHORECTOMY      Current Medications: Current Meds  Medication Sig  . ALPRAZolam (XANAX) 0.5 MG tablet Take 0.25 mg by mouth daily as needed for anxiety.  Marland Kitchen aspirin EC 81 MG tablet Take 81 mg by mouth daily.  . bumetanide (BUMEX) 1 MG tablet Take 1 mg by mouth daily as needed (swelling).   Marland Kitchen buPROPion (WELLBUTRIN XL) 300 MG 24 hr tablet Take 300 mg by mouth at bedtime.   . Cholecalciferol (VITAMIN D) 2000 units CAPS Take 2,000 Units by mouth daily.  Marland Kitchen diltiazem (CARDIZEM CD) 120 MG 24 hr capsule Take 1 capsule (120 mg total) by mouth daily.  . enalapril (VASOTEC) 20 MG tablet Take 20 mg by mouth daily.  . folic acid (FOLVITE) 400 MCG tablet Take 400 mcg by mouth daily.  Marland Kitchen latanoprost (XALATAN) 0.005 % ophthalmic solution Place 1 drop into both eyes at bedtime.  Marland Kitchen levothyroxine (SYNTHROID) 25 MCG tablet Take 25 mcg by mouth daily before breakfast.  . methotrexate (RHEUMATREX) 2.5 MG tablet Take 25 mg by mouth once a week. Caution:Chemotherapy. Protect from light.  . metoprolol tartrate (LOPRESSOR) 25 MG tablet Take 25 mg by mouth 2 (two) times daily.  . Multiple Vitamin (MULTIVITAMIN) capsule Take 1 capsule by mouth daily.  . nitroGLYCERIN (NITROSTAT) 0.4 MG SL tablet Place 1 tablet (0.4 mg total) under the tongue every 5 (five) minutes as needed for chest pain.  Marland Kitchen omeprazole (PRILOSEC) 20 MG capsule Take 20 mg by mouth daily.  . polyethylene glycol (MIRALAX / GLYCOLAX) packet Take 17 g by mouth daily as  needed for mild constipation.   . pravastatin (PRAVACHOL) 20 MG tablet Take 20 mg by mouth daily.  . ranolazine (RANEXA) 1000 MG SR tablet Take 1,000 mg by mouth daily.  . sertraline (ZOLOFT) 100 MG tablet Take 50 mg by mouth daily.  . traMADol (ULTRAM) 50 MG tablet Take 50 mg by mouth every 6 (six) hours as needed for pain.  . traZODone (DESYREL) 50 MG tablet Take 1 tablet (50 mg total) by mouth at bedtime.  . Vitamin D, Ergocalciferol, (DRISDOL) 50000 units CAPS capsule Take 50,000 Units by mouth every Sunday.      Allergies:   Codeine, Iodine, and Shellfish allergy   Social History   Socioeconomic History  . Marital status: Married    Spouse name: Not on file  . Number of children: Not on file  . Years of education: Not on file  . Highest education level: Not on file  Occupational History  . Not on file  Tobacco Use  .  Smoking status: Never Smoker  . Smokeless tobacco: Never Used  Vaping Use  . Vaping Use: Never used  Substance and Sexual Activity  . Alcohol use: No  . Drug use: No  . Sexual activity: Not on file  Other Topics Concern  . Not on file  Social History Narrative  . Not on file   Social Determinants of Health   Financial Resource Strain: Not on file  Food Insecurity: Not on file  Transportation Needs: Not on file  Physical Activity: Not on file  Stress: Not on file  Social Connections: Not on file     Family History: The patient's family history includes Heart disease in her father and mother.  ROS:   Please see the history of present illness.    All other systems reviewed and are negative.  EKGs/Labs/Other Studies Reviewed:    The following studies were reviewed today: LEFT HEART CATH AND CORONARY ANGIOGRAPHY    Conclusion    The left ventricular systolic function is normal.  LV end diastolic pressure is normal.  The left ventricular ejection fraction is 55-65% by visual estimate.  There is no mitral valve regurgitation.  Ost LAD  lesion is 15% stenosed. Ost Cx lesion is 25% stenosed.  Segmental mid LAD myocardial bridge from 0% to 65% -> stable in comparison to prior study October 2019   SUMMARY  Stable angiographic findings from 2019-mid LAD myocardial bridging appears stable. ->  Would indicate that her symptoms are likely related to bridging.  Otherwise normal coronary arteries.  Normal LV function and mildly elevated EDP.  RECOMMENDATIONS  Discharge home after bedrest  She is on significant antianginal therapy-  Consider symptom limited Treadmill Myoview stress test while on maximal therapy, including diltiazem and metoprolol.  Injection of stress sestamibi would be 1 minute prior to max tolerated exercise     Recent Labs: 01/14/2020: ALT 25; BUN 12; Creatinine, Ser 0.96; Hemoglobin 14.5; Platelets 230; Potassium 4.2; Sodium 143; TSH 2.220  Recent Lipid Panel    Component Value Date/Time   CHOL 168 07/28/2019 1131   TRIG 120 07/28/2019 1131   HDL 50 07/28/2019 1131   CHOLHDL 3.4 07/28/2019 1131   LDLCALC 97 07/28/2019 1131    Physical Exam:    VS:  BP 116/66   Pulse 66   Ht 5\' 3"  (1.6 m)   Wt 199 lb 9.6 oz (90.5 kg)   SpO2 95%   BMI 35.36 kg/m     Wt Readings from Last 3 Encounters:  07/16/20 199 lb 9.6 oz (90.5 kg)  03/19/20 190 lb (86.2 kg)  01/19/20 202 lb (91.6 kg)     GEN: Patient is in no acute distress HEENT: Normal NECK: No JVD; No carotid bruits LYMPHATICS: No lymphadenopathy CARDIAC: Hear sounds regular, 2/6 systolic murmur at the apex. RESPIRATORY:  Clear to auscultation without rales, wheezing or rhonchi  ABDOMEN: Soft, non-tender, non-distended MUSCULOSKELETAL:  No edema; No deformity  SKIN: Warm and dry NEUROLOGIC:  Alert and oriented x 3 PSYCHIATRIC:  Normal affect   Signed, 01/21/20, MD  07/16/2020 3:29 PM    [facility]

## 2020-07-20 DIAGNOSIS — E039 Hypothyroidism, unspecified: Secondary | ICD-10-CM | POA: Diagnosis not present

## 2020-07-20 DIAGNOSIS — I25110 Atherosclerotic heart disease of native coronary artery with unstable angina pectoris: Secondary | ICD-10-CM | POA: Diagnosis not present

## 2020-07-20 DIAGNOSIS — E113292 Type 2 diabetes mellitus with mild nonproliferative diabetic retinopathy without macular edema, left eye: Secondary | ICD-10-CM | POA: Diagnosis not present

## 2020-07-20 DIAGNOSIS — E782 Mixed hyperlipidemia: Secondary | ICD-10-CM | POA: Diagnosis not present

## 2020-07-20 DIAGNOSIS — I10 Essential (primary) hypertension: Secondary | ICD-10-CM | POA: Diagnosis not present

## 2020-07-20 LAB — HM DIABETES EYE EXAM

## 2020-07-21 LAB — BASIC METABOLIC PANEL
BUN/Creatinine Ratio: 8 — ABNORMAL LOW (ref 12–28)
BUN: 8 mg/dL (ref 8–27)
CO2: 19 mmol/L — ABNORMAL LOW (ref 20–29)
Calcium: 9.2 mg/dL (ref 8.7–10.3)
Chloride: 107 mmol/L — ABNORMAL HIGH (ref 96–106)
Creatinine, Ser: 0.99 mg/dL (ref 0.57–1.00)
Glucose: 94 mg/dL (ref 65–99)
Potassium: 4.3 mmol/L (ref 3.5–5.2)
Sodium: 142 mmol/L (ref 134–144)
eGFR: 59 mL/min/{1.73_m2} — ABNORMAL LOW (ref >59)

## 2020-07-21 LAB — LIPID PANEL
Chol/HDL Ratio: 2.8 ratio (ref 0.0–4.4)
Cholesterol, Total: 148 mg/dL (ref 100–199)
HDL: 52 mg/dL (ref >39)
LDL Chol Calc (NIH): 77 mg/dL (ref 0–99)
Triglycerides: 102 mg/dL (ref 0–149)
VLDL Cholesterol Cal: 19 mg/dL (ref 5–40)

## 2020-07-21 LAB — HEPATIC FUNCTION PANEL
ALT: 10 IU/L (ref 0–32)
AST: 18 IU/L (ref 0–40)
Albumin: 3.9 g/dL (ref 3.7–4.7)
Alkaline Phosphatase: 115 IU/L (ref 44–121)
Bilirubin Total: 0.4 mg/dL (ref 0.0–1.2)
Bilirubin, Direct: 0.15 mg/dL (ref 0.00–0.40)
Total Protein: 6.6 g/dL (ref 6.0–8.5)

## 2020-07-21 LAB — CBC WITH DIFFERENTIAL/PLATELET
Basophils Absolute: 0.1 10*3/uL (ref 0.0–0.2)
Basos: 1 %
EOS (ABSOLUTE): 0.2 10*3/uL (ref 0.0–0.4)
Eos: 3 %
Hematocrit: 44.2 % (ref 34.0–46.6)
Hemoglobin: 14.7 g/dL (ref 11.1–15.9)
Immature Grans (Abs): 0 10*3/uL (ref 0.0–0.1)
Immature Granulocytes: 1 %
Lymphocytes Absolute: 2.2 10*3/uL (ref 0.7–3.1)
Lymphs: 34 %
MCH: 31.7 pg (ref 26.6–33.0)
MCHC: 33.3 g/dL (ref 31.5–35.7)
MCV: 96 fL (ref 79–97)
Monocytes Absolute: 0.6 10*3/uL (ref 0.1–0.9)
Monocytes: 9 %
Neutrophils Absolute: 3.5 10*3/uL (ref 1.4–7.0)
Neutrophils: 52 %
Platelets: 204 10*3/uL (ref 150–450)
RBC: 4.63 x10E6/uL (ref 3.77–5.28)
RDW: 14 % (ref 11.7–15.4)
WBC: 6.5 10*3/uL (ref 3.4–10.8)

## 2020-07-21 LAB — TSH: TSH: 2.95 u[IU]/mL (ref 0.450–4.500)

## 2020-07-26 ENCOUNTER — Other Ambulatory Visit: Payer: Self-pay

## 2020-07-26 DIAGNOSIS — E039 Hypothyroidism, unspecified: Secondary | ICD-10-CM

## 2020-07-26 DIAGNOSIS — E782 Mixed hyperlipidemia: Secondary | ICD-10-CM

## 2020-07-26 DIAGNOSIS — E119 Type 2 diabetes mellitus without complications: Secondary | ICD-10-CM

## 2020-07-26 DIAGNOSIS — I10 Essential (primary) hypertension: Secondary | ICD-10-CM

## 2020-07-26 MED ORDER — LEVOTHYROXINE SODIUM 25 MCG PO TABS: 25.0000 ug | ORAL_TABLET | Freq: Every day | ORAL | 0 refills | Status: DC

## 2020-07-27 ENCOUNTER — Other Ambulatory Visit: Payer: Self-pay

## 2020-07-27 ENCOUNTER — Other Ambulatory Visit: Payer: Self-pay | Admitting: Legal Medicine

## 2020-07-27 ENCOUNTER — Ambulatory Visit: Payer: Medicare HMO

## 2020-07-27 DIAGNOSIS — E039 Hypothyroidism, unspecified: Secondary | ICD-10-CM | POA: Diagnosis not present

## 2020-07-27 DIAGNOSIS — I10 Essential (primary) hypertension: Secondary | ICD-10-CM

## 2020-07-27 DIAGNOSIS — E782 Mixed hyperlipidemia: Secondary | ICD-10-CM | POA: Diagnosis not present

## 2020-07-27 DIAGNOSIS — E119 Type 2 diabetes mellitus without complications: Secondary | ICD-10-CM

## 2020-07-27 MED ORDER — DILTIAZEM HCL ER COATED BEADS 120 MG PO CP24: 120.0000 mg | ORAL_CAPSULE | Freq: Every day | ORAL | 3 refills | Status: DC

## 2020-07-27 NOTE — Telephone Encounter (Signed)
Diltiazem CD 120 mg # 90 with 3 additional refills to [facility]

## 2020-07-28 ENCOUNTER — Encounter: Payer: Self-pay | Admitting: Legal Medicine

## 2020-07-28 DIAGNOSIS — H524 Presbyopia: Secondary | ICD-10-CM | POA: Diagnosis not present

## 2020-07-28 DIAGNOSIS — Z6835 Body mass index (BMI) 35.0-35.9, adult: Secondary | ICD-10-CM | POA: Diagnosis not present

## 2020-07-28 DIAGNOSIS — F5101 Primary insomnia: Secondary | ICD-10-CM | POA: Diagnosis not present

## 2020-07-28 DIAGNOSIS — G894 Chronic pain syndrome: Secondary | ICD-10-CM | POA: Diagnosis not present

## 2020-07-28 DIAGNOSIS — H52223 Regular astigmatism, bilateral: Secondary | ICD-10-CM | POA: Diagnosis not present

## 2020-07-28 DIAGNOSIS — M150 Primary generalized (osteo)arthritis: Secondary | ICD-10-CM | POA: Diagnosis not present

## 2020-07-28 DIAGNOSIS — M069 Rheumatoid arthritis, unspecified: Secondary | ICD-10-CM | POA: Diagnosis not present

## 2020-07-28 DIAGNOSIS — M65341 Trigger finger, right ring finger: Secondary | ICD-10-CM | POA: Diagnosis not present

## 2020-07-28 DIAGNOSIS — M5416 Radiculopathy, lumbar region: Secondary | ICD-10-CM | POA: Diagnosis not present

## 2020-07-28 DIAGNOSIS — R682 Dry mouth, unspecified: Secondary | ICD-10-CM | POA: Diagnosis not present

## 2020-07-28 DIAGNOSIS — E669 Obesity, unspecified: Secondary | ICD-10-CM | POA: Diagnosis not present

## 2020-07-28 LAB — CBC WITH DIFFERENTIAL/PLATELET
Basophils Absolute: 0 10*3/uL (ref 0.0–0.2)
Basos: 0 %
EOS (ABSOLUTE): 0 10*3/uL (ref 0.0–0.4)
Eos: 0 %
Hematocrit: 42.1 % (ref 34.0–46.6)
Hemoglobin: 14.5 g/dL (ref 11.1–15.9)
Immature Grans (Abs): 0.1 10*3/uL (ref 0.0–0.1)
Immature Granulocytes: 1 %
Lymphocytes Absolute: 2.8 10*3/uL (ref 0.7–3.1)
Lymphs: 23 %
MCH: 32.2 pg (ref 26.6–33.0)
MCHC: 34.4 g/dL (ref 31.5–35.7)
MCV: 94 fL (ref 79–97)
Monocytes Absolute: 0.7 10*3/uL (ref 0.1–0.9)
Monocytes: 5 %
Neutrophils Absolute: 9 10*3/uL — ABNORMAL HIGH (ref 1.4–7.0)
Neutrophils: 71 %
Platelets: 204 10*3/uL (ref 150–450)
RBC: 4.5 x10E6/uL (ref 3.77–5.28)
RDW: 13.5 % (ref 11.7–15.4)
WBC: 12.6 10*3/uL — ABNORMAL HIGH (ref 3.4–10.8)

## 2020-07-28 LAB — COMPREHENSIVE METABOLIC PANEL
ALT: 16 IU/L (ref 0–32)
AST: 23 IU/L (ref 0–40)
Albumin/Globulin Ratio: 1.5 (ref 1.2–2.2)
Albumin: 4 g/dL (ref 3.7–4.7)
Alkaline Phosphatase: 130 IU/L — ABNORMAL HIGH (ref 44–121)
BUN/Creatinine Ratio: 18 (ref 12–28)
BUN: 17 mg/dL (ref 8–27)
Bilirubin Total: 0.3 mg/dL (ref 0.0–1.2)
CO2: 21 mmol/L (ref 20–29)
Calcium: 9.2 mg/dL (ref 8.7–10.3)
Chloride: 107 mmol/L — ABNORMAL HIGH (ref 96–106)
Creatinine, Ser: 0.97 mg/dL (ref 0.57–1.00)
Globulin, Total: 2.6 g/dL (ref 1.5–4.5)
Glucose: 106 mg/dL — ABNORMAL HIGH (ref 65–99)
Potassium: 4.3 mmol/L (ref 3.5–5.2)
Sodium: 144 mmol/L (ref 134–144)
Total Protein: 6.6 g/dL (ref 6.0–8.5)
eGFR: 61 mL/min/{1.73_m2} (ref >59)

## 2020-07-28 LAB — CARDIOVASCULAR RISK ASSESSMENT

## 2020-07-28 LAB — HEMOGLOBIN A1C
Est. average glucose Bld gHb Est-mCnc: 126 mg/dL
Hgb A1c MFr Bld: 6 % — ABNORMAL HIGH (ref 4.8–5.6)

## 2020-07-28 LAB — TSH: TSH: 1.74 u[IU]/mL (ref 0.450–4.500)

## 2020-07-28 LAB — LIPID PANEL
Chol/HDL Ratio: 2.6 ratio (ref 0.0–4.4)
Cholesterol, Total: 154 mg/dL (ref 100–199)
HDL: 60 mg/dL (ref >39)
LDL Chol Calc (NIH): 79 mg/dL (ref 0–99)
Triglycerides: 76 mg/dL (ref 0–149)
VLDL Cholesterol Cal: 15 mg/dL (ref 5–40)

## 2020-07-28 NOTE — Progress Notes (Signed)
TSH 1.74 normal, wbc high sick?, no anemia, Cholesterol normal, A1c 6.0, glucose 106, kidney tests normal, liver tests normal,  lp

## 2020-07-29 ENCOUNTER — Other Ambulatory Visit: Payer: Self-pay

## 2020-07-29 ENCOUNTER — Ambulatory Visit (INDEPENDENT_AMBULATORY_CARE_PROVIDER_SITE_OTHER): Payer: Medicare HMO | Admitting: Legal Medicine

## 2020-07-29 ENCOUNTER — Encounter: Payer: Self-pay | Admitting: Legal Medicine

## 2020-07-29 VITALS — BP 130/80 | HR 92 | Temp 97.1°F | Resp 16 | Ht 63.0 in | Wt 201.0 lb

## 2020-07-29 DIAGNOSIS — F331 Major depressive disorder, recurrent, moderate: Secondary | ICD-10-CM

## 2020-07-29 DIAGNOSIS — E1159 Type 2 diabetes mellitus with other circulatory complications: Secondary | ICD-10-CM | POA: Diagnosis not present

## 2020-07-29 DIAGNOSIS — I152 Hypertension secondary to endocrine disorders: Secondary | ICD-10-CM | POA: Diagnosis not present

## 2020-07-29 DIAGNOSIS — E039 Hypothyroidism, unspecified: Secondary | ICD-10-CM | POA: Diagnosis not present

## 2020-07-29 DIAGNOSIS — E1169 Type 2 diabetes mellitus with other specified complication: Secondary | ICD-10-CM

## 2020-07-29 DIAGNOSIS — I5022 Chronic systolic (congestive) heart failure: Secondary | ICD-10-CM

## 2020-07-29 DIAGNOSIS — E119 Type 2 diabetes mellitus without complications: Secondary | ICD-10-CM

## 2020-07-29 DIAGNOSIS — E782 Mixed hyperlipidemia: Secondary | ICD-10-CM | POA: Diagnosis not present

## 2020-07-29 DIAGNOSIS — I25110 Atherosclerotic heart disease of native coronary artery with unstable angina pectoris: Secondary | ICD-10-CM

## 2020-07-29 DIAGNOSIS — I10 Essential (primary) hypertension: Secondary | ICD-10-CM

## 2020-07-29 DIAGNOSIS — F332 Major depressive disorder, recurrent severe without psychotic features: Secondary | ICD-10-CM

## 2020-07-29 DIAGNOSIS — M0609 Rheumatoid arthritis without rheumatoid factor, multiple sites: Secondary | ICD-10-CM | POA: Diagnosis not present

## 2020-07-29 DIAGNOSIS — E669 Obesity, unspecified: Secondary | ICD-10-CM

## 2020-07-29 HISTORY — DX: Type 2 diabetes mellitus with other circulatory complications: E11.59

## 2020-07-29 HISTORY — DX: Type 2 diabetes mellitus with other specified complication: E11.69

## 2020-07-29 HISTORY — DX: Type 2 diabetes mellitus with other circulatory complications: E66.9

## 2020-07-29 LAB — POCT UA - MICROALBUMIN: Microalbumin Ur, POC: 80 mg/L

## 2020-07-29 MED ORDER — ARIPIPRAZOLE 2 MG PO TABS: 2.0000 mg | ORAL_TABLET | Freq: Every day | ORAL | 3 refills | Status: DC

## 2020-07-29 NOTE — Progress Notes (Signed)
Subjective:  Patient ID: Deborah Farley, female    DOB: 08-05-1945  Age: 75 y.o. MRN: [id number]  Chief Complaint  Patient presents with  . Hypertension  . Hyperlipidemia  . Hypothyroidism    HPI : chronic visit  Patient presents for follow up of hypertension.  Patient tolerating metoprolol now well with side effects.  Patient was diagnosed with hypertension 2010 so has been treated for hypertension for 10 years.Patient is working on maintaining diet and exercise regimen and follows up as directed. Complication include CAD  Patient presents with hyperlipidemia.  Compliance with treatment has been good; patient takes medicines as directed, maintains low cholesterol diet, follows up as directed, and maintains exercise regimen.  Patient is using pravastatin without problems..   Patient has HYPOTHYROIDISM.  Diagnosed 10 years ago.  Patient has stable thyroid readings.  Patient is having no energy.  Last TSH was normal.  continue dosage of thyroid medicine.  CORONARY ARTERY DISEASE  Patient presents in follow up of CAD. Patient was diagnosed in 2021. The patient has no associated CHF. The patient is currently taking a beta blocker, statin, and aspirin. CAD was diagnosed 1 years ago.  Patient is having no angina. Patient has used no NTG.  Patient is followed by cardiology.  Patient had no interventin . Last angiography was 2021, last echocardiogram na. Current Outpatient Medications on File Prior to Visit  Medication Sig Dispense Refill  . ALPRAZolam (XANAX) 0.5 MG tablet Take 0.25 mg by mouth daily as needed for anxiety.    Marland Kitchen aspirin EC 81 MG tablet Take 81 mg by mouth daily.    . bumetanide (BUMEX) 1 MG tablet Take 1 mg by mouth daily as needed (swelling).     Marland Kitchen buPROPion (WELLBUTRIN XL) 300 MG 24 hr tablet Take 300 mg by mouth at bedtime.     . Cholecalciferol (VITAMIN D) 2000 units CAPS Take 2,000 Units by mouth daily.    Marland Kitchen diltiazem (CARDIZEM CD) 120 MG 24 hr capsule Take 1 capsule (120  mg total) by mouth daily. 90 capsule 3  . folic acid (FOLVITE) 400 MCG tablet Take 400 mcg by mouth daily.    Marland Kitchen latanoprost (XALATAN) 0.005 % ophthalmic solution Place 1 drop into both eyes at bedtime.    Marland Kitchen levothyroxine (SYNTHROID) 25 MCG tablet Take 1 tablet (25 mcg total) by mouth daily before breakfast. 30 tablet 0  . methotrexate (RHEUMATREX) 2.5 MG tablet Take 25 mg by mouth once a week. Caution:Chemotherapy. Protect from light.    . metoprolol tartrate (LOPRESSOR) 25 MG tablet Take 25 mg by mouth 2 (two) times daily.    . Multiple Vitamin (MULTIVITAMIN) capsule Take 1 capsule by mouth daily.    . nitroGLYCERIN (NITROSTAT) 0.4 MG SL tablet Place 1 tablet (0.4 mg total) under the tongue every 5 (five) minutes as needed for chest pain. 25 tablet 0  . omeprazole (PRILOSEC) 20 MG capsule Take 20 mg by mouth daily.    . polyethylene glycol (MIRALAX / GLYCOLAX) packet Take 17 g by mouth daily as needed for mild constipation.     . pravastatin (PRAVACHOL) 20 MG tablet Take 20 mg by mouth daily.    . sertraline (ZOLOFT) 100 MG tablet Take 50 mg by mouth daily.    . traMADol (ULTRAM) 50 MG tablet Take 50 mg by mouth every 6 (six) hours as needed for pain.    . traZODone (DESYREL) 50 MG tablet Take 1 tablet (50 mg total) by mouth at bedtime. 90  tablet 1  . Vitamin D, Ergocalciferol, (DRISDOL) 50000 units CAPS capsule Take 50,000 Units by mouth every Sunday.      No current facility-administered medications on file prior to visit.   Past Medical History:  Diagnosis Date  . Acid reflux 02/29/2016  . Acute sinusitis 03/19/2020  . Chronic idiopathic constipation 07/02/2015  . Chronic systolic congestive heart failure (HCC) 07/02/2015  . Coronary artery disease involving native coronary artery of native heart with unstable angina pectoris (HCC) 07/02/2015   Formatting of this note might be different from the original. [place]  . 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 Depression   . Diabetes (HCC)  03/01/2016  . Diabetes mellitus without complication (HCC)   . Diet-controlled diabetes mellitus (HCC) 08/11/2015  . Dyspnea on exertion 01/16/2019  . Epigastric pain 04/08/2015  . Gait disturbance 03/15/2017  . Heart disease   . Hernia, hiatal   . Hiatal hernia 07/02/2015  . High cholesterol   . High risk medication use 04/12/2016   Methotrexate PLQ Eye Exam: 07/28/16 WNL @ [facility] Follow up in 6 months.   . History of diabetes mellitus 05/31/2016  . History of gastroesophageal reflux (GERD) 05/31/2016  . History of hypothyroidism 05/31/2016  . Hyperlipidemia 02/29/2016  . Hypertension   . Hypothyroidism   . Malaise and fatigue 07/02/2015  . Memory difficulty   . Moderate episode of recurrent major depressive disorder (HCC) 07/02/2015  . Obesity (BMI 35.0-39.9 without comorbidity) 02/29/2016  . Progressive angina (HCC) 08/11/2015  . Rheumatoid arthritis (HCC) 04/12/2016  . Spastic esophagus   . Suspected pulmonary embolism 03/11/2019   Past Surgical History:  Procedure Laterality Date  . ABDOMINAL HYSTERECTOMY    . BACK SURGERY    . CARDIAC CATHETERIZATION N/A 08/12/2015   Procedure: Left Heart Cath and Coronary Angiography;  Surgeon: Yates Decamp, MD;  Location: [facility] INVASIVE CV LAB;  Service: Cardiovascular;  Laterality: N/A;  . LEFT HEART CATH AND CORONARY ANGIOGRAPHY N/A 02/20/2018   Procedure: LEFT HEART CATH AND CORONARY ANGIOGRAPHY;  Surgeon: Lennette Bihari, MD;  Location: [facility] INVASIVE CV LAB;  Service: Cardiovascular;  Laterality: N/A;  . LEFT HEART CATH AND CORONARY ANGIOGRAPHY N/A 01/19/2020   Procedure: LEFT HEART CATH AND CORONARY ANGIOGRAPHY;  Surgeon: Marykay Lex, MD;  Location: [facility] INVASIVE CV LAB;  Service: Cardiovascular;  Laterality: N/A;  . TOTAL ABDOMINAL HYSTERECTOMY W/ BILATERAL SALPINGOOPHORECTOMY      Family History  Problem Relation Age of Onset  . Heart disease Mother   . Heart disease Father    Social History   Socioeconomic History  . Marital status:  Married    Spouse name: Not on file  . Number of children: Not on file  . Years of education: Not on file  . Highest education level: Not on file  Occupational History  . Not on file  Tobacco Use  . Smoking status: Never Smoker  . Smokeless tobacco: Never Used  Vaping Use  . Vaping Use: Never used  Substance and Sexual Activity  . Alcohol use: No  . Drug use: No  . Sexual activity: Yes    Partners: Male  Other Topics Concern  . Not on file  Social History Narrative  . Not on file   Social Determinants of Health   Financial Resource Strain: Not on file  Food Insecurity: Not on file  Transportation Needs: Not on file  Physical Activity: Not on file  Stress: Not on file  Social Connections: Not on file  Review of Systems  Constitutional: Positive for fatigue. Negative for activity change and appetite change.  HENT: Negative for congestion and sinus pain.   Eyes: Negative for visual disturbance.  Respiratory: Negative for chest tightness and shortness of breath.   Cardiovascular: Negative for chest pain, palpitations and leg swelling.  Gastrointestinal: Negative for abdominal distention and abdominal pain.  Endocrine: Negative for polyuria.  Genitourinary: Negative for dysuria and urgency.  Musculoskeletal: Negative for arthralgias and back pain.  Skin: Negative.   Neurological: Negative.   Psychiatric/Behavioral: Negative.      Objective:  BP 130/80   Pulse 92   Temp (!) 97.1 F (36.2 C)   Resp 16   Ht  (1.6 m)   Wt 201 lb (91.2 kg)   SpO2 96%   BMI 35.61 kg/m   BP/Weight 07/29/2020 07/16/2020 03/19/2020  Systolic BP 130 116 -  Diastolic BP 80 66 -  Wt. (Lbs) 201 199.6 190  BMI 35.61 35.36 33.66    Physical Exam Vitals reviewed.  Constitutional:      Appearance: Normal appearance. She is normal weight.  HENT:     Head: Normocephalic and atraumatic.     Right Ear: Tympanic membrane, ear canal and external ear normal.     Left Ear: Tympanic  membrane, ear canal and external ear normal.     Mouth/Throat:     Mouth: Mucous membranes are moist.     Pharynx: Oropharynx is clear.  Eyes:     Extraocular Movements: Extraocular movements intact.     Conjunctiva/sclera: Conjunctivae normal.     Pupils: Pupils are equal, round, and reactive to light.  Cardiovascular:     Rate and Rhythm: Normal rate and regular rhythm.     Pulses: Normal pulses.     Heart sounds: Normal heart sounds. No murmur heard. No gallop.   Pulmonary:     Effort: Pulmonary effort is normal. No respiratory distress.     Breath sounds: Normal breath sounds. No rales.  Abdominal:     General: Abdomen is flat. Bowel sounds are normal. There is no distension.     Palpations: Abdomen is soft.     Tenderness: There is no abdominal tenderness.  Musculoskeletal:        General: Tenderness present. Normal range of motion.     Cervical back: Normal range of motion and neck supple.  Skin:    General: Skin is warm and dry.     Capillary Refill: Capillary refill takes less than 2 seconds.  Neurological:     General: No focal deficit present.     Mental Status: She is alert and oriented to person, place, and time. Mental status is at baseline.     Diabetic Foot Exam - Simple   Simple Foot Form Diabetic Foot exam was performed with the following findings: Yes 07/29/2020  9:18 AM  Visual Inspection No deformities, no ulcerations, no other skin breakdown bilaterally: Yes Sensation Testing Intact to touch and monofilament testing bilaterally: Yes Pulse Check Posterior Tibialis and Dorsalis pulse intact bilaterally: Yes Comments     Depression screen PHQ 2/9 07/29/2020  Decreased Interest 3  Down, Depressed, Hopeless 3  PHQ - 2 Score 6  Altered sleeping 2  Tired, decreased energy 3  Change in appetite 2  Feeling bad or failure about yourself  3  Trouble concentrating 3  Moving slowly or fidgety/restless 2  Suicidal thoughts 2  PHQ-9 Score 23  Difficult  doing work/chores Somewhat difficult  Lab Results  Component Value Date   WBC 12.6 (H) 07/27/2020   HGB 14.5 07/27/2020   HCT 42.1 07/27/2020   PLT 204 07/27/2020   GLUCOSE 106 (H) 07/27/2020   CHOL 154 07/27/2020   TRIG 76 07/27/2020   HDL 60 07/27/2020   LDLCALC 79 07/27/2020   ALT 16 07/27/2020   AST 23 07/27/2020   NA 144 07/27/2020   K 4.3 07/27/2020   CL 107 (H) 07/27/2020   CREATININE 0.97 07/27/2020   BUN 17 07/27/2020   CO2 21 07/27/2020   TSH 1.740 07/27/2020   INR 1.07 08/12/2015   HGBA1C 6.0 (H) 07/27/2020   MICROALBUR 80 07/29/2020      Assessment & Plan:   Diagnoses and all orders for this visit: Hypothyroidism, unspecified type Patient is known to have hypothyroidism and is n treatment with levothyroxine .  Patient was diagnosed 10 years ago.  Other treatment includes none.  Patient is compliant with medicines and last TSH 6 months ago.  Last TSH was normal . Mixed hyperlipidemia AN INDIVIDUAL CARE PLAN for hyperlipidemia/ cholesterol was established and reinforced today.  The patient's status was assessed using clinical findings on exam, lab and other diagnostic tests. The patient's disease status was assessed based on evidence-based guidelines and found to be fair controlled. MEDICATIONS were reviewed. SELF MANAGEMENT GOALS have been discussed and patient's success at attaining the goal of low cholesterol was assessed. RECOMMENDATION given include regular exercise 3 days a week and low cholesterol/low fat diet. CLINICAL SUMMARY including written plan to identify barriers unique to the patient due to social or economic  reasons was discussed.  Diet-controlled diabetes mellitus (HCC) Patient has DM that is diet controlled, A1c OK  Primary hypertension An individual hypertension care plan was established and reinforced today.  The patient's status was assessed using clinical findings on exam and labs or diagnostic tests. The patient's success at  meeting treatment goals on disease specific evidence-based guidelines and found to be good controlled. SELF MANAGEMENT: The patient and I together assessed ways to personally work towards obtaining the recommended goals. RECOMMENDATIONS: avoid decongestants found in common cold remedies, decrease consumption of alcohol, perform routine monitoring of BP with home BP cuff, exercise, reduction of dietary salt, take medicines as prescribed, try not to miss doses and quit smoking.  Regular exercise and maintaining a healthy weight is needed.  Stress reduction may help. A CLINICAL SUMMARY including written plan identify barriers to care unique to individual due to social or financial issues.  We attempt to mutually creat solutions for individual and family understanding.  Rheumatoid arthritis of multiple sites with negative rheumatoid factor (HCC) AN INDIVIDUAL CARE PLAN for RA was established and reinforced today.  The patient's status was assessed using clinical findings on exam, labs, and other diagnostic testing. Patient's success at meeting treatment goals based on disease specific evidence-bassed guidelines and found to be in good control. RECOMMENDATIONS include maintaining present medicines and treatment. On DMARD  Chronic systolic congestive heart failure [facility]) An individualized care plan was established and reinforced.  The patient's disease status was assessed using clinical finding son exam today, labs, and/or other diagnostic testing such as x-rays, to determine the patient's success in meeting treatmentgoalsbased on disease-based guidelines and found to beimproving. But not at goal yet. Medications prescriptions no change Laboratory tests ordered to be performed today include routine. RECOMMENDATIONS: given include see cardiology.  Call physician is patient gains 3 lbs in one day or 5 lbs for one week.  Call for progressive PND, orthopnea or increased pedal edema.  Moderate episode of  recurrent major depressive disorder (HCC) Patient's depression is uncontrolled with buproprion.   Anhedonia worse.  PHQ 9 was performed score 23. An individual care plan was established or reinforced today.  The patient's disease status was assessed using clinical findings on exam, labs, and or other diagnostic testing to determine patient's success in meeting treatment goals based on disease specific evidence-based guidelines and found to be worsening Recommendations include add abilify  Coronary artery disease involving native coronary artery of native heart with unstable angina pectoris (HCC) Patient's CAD was assessed using history and physical along with other information to maximize treatment.  Evidence based criteria was use in deciding proper management for this disease process.  Patient's CAD is under good control.therapy continue present treatment . Obesity, diabetes, and hypertension syndrome (HCC) -     POCT UA - Microalbumin Patient is doing well on diet  Severe episode of recurrent major depressive disorder, without psychotic features (HCC) -     ARIPiprazole (ABILIFY) 2 MG tablet; Take 1 tablet (2 mg total) by mouth daily. See above          I spent 30 minutes dedicated to the care of this patient on the date of this encounter to include face-to-face time with the patient, as well as: review records  Follow-up: Return in about 1 month (around 08/29/2020) for for depression.  An After Visit Summary was printed and given to the patient.  Brent Bulla, MD [facility] [phone]

## 2020-08-31 ENCOUNTER — Other Ambulatory Visit: Payer: Self-pay

## 2020-08-31 ENCOUNTER — Encounter: Payer: Self-pay | Admitting: Legal Medicine

## 2020-08-31 ENCOUNTER — Ambulatory Visit (INDEPENDENT_AMBULATORY_CARE_PROVIDER_SITE_OTHER): Payer: Medicare HMO | Admitting: Legal Medicine

## 2020-08-31 VITALS — BP 126/78 | HR 92 | Temp 97.2°F | Ht 63.0 in | Wt 196.0 lb

## 2020-08-31 DIAGNOSIS — M0609 Rheumatoid arthritis without rheumatoid factor, multiple sites: Secondary | ICD-10-CM

## 2020-08-31 DIAGNOSIS — F321 Major depressive disorder, single episode, moderate: Secondary | ICD-10-CM | POA: Insufficient documentation

## 2020-08-31 DIAGNOSIS — F332 Major depressive disorder, recurrent severe without psychotic features: Secondary | ICD-10-CM | POA: Insufficient documentation

## 2020-08-31 DIAGNOSIS — F331 Major depressive disorder, recurrent, moderate: Secondary | ICD-10-CM

## 2020-08-31 HISTORY — DX: Major depressive disorder, single episode, moderate: F32.1

## 2020-08-31 HISTORY — DX: Major depressive disorder, recurrent, moderate: F33.1

## 2020-08-31 MED ORDER — ARIPIPRAZOLE 2 MG PO TABS: 2.0000 mg | ORAL_TABLET | Freq: Every day | ORAL | 2 refills | Status: DC

## 2020-08-31 NOTE — Progress Notes (Signed)
Subjective:  Patient ID: Deborah Farley, female    DOB: 01-Sep-1945  Age: 75 y.o. MRN: [id number]  Chief Complaint  Patient presents with  . Depression    HPI : follow up  Depression was started on abilify  This patient has major depression for 1 year.  PHQ9 =11.  Patient is having less  anhedonia.  The patient has improved future plans and prospects.  The depression is worse with trees, alcoholic husband.  The patient is not exercising and working on behavior to improve mental health.  Patient is not seeing a therapist or psychiatrist.  na  Patient is on abilify and buproprion and zoloft  RA on methotrexate Current Outpatient Medications on File Prior to Visit  Medication Sig Dispense Refill  . ALPRAZolam (XANAX) 0.5 MG tablet Take 0.25 mg by mouth daily as needed for anxiety.    Marland Kitchen aspirin EC 81 MG tablet Take 81 mg by mouth daily.    . bumetanide (BUMEX) 1 MG tablet Take 1 mg by mouth daily as needed (swelling).     Marland Kitchen buPROPion (WELLBUTRIN XL) 300 MG 24 hr tablet Take 300 mg by mouth at bedtime.     . Cholecalciferol (VITAMIN D) 2000 units CAPS Take 2,000 Units by mouth daily.    Marland Kitchen diltiazem (CARDIZEM CD) 120 MG 24 hr capsule Take 1 capsule (120 mg total) by mouth daily. 90 capsule 3  . folic acid (FOLVITE) 400 MCG tablet Take 400 mcg by mouth daily.    Marland Kitchen latanoprost (XALATAN) 0.005 % ophthalmic solution Place 1 drop into both eyes at bedtime.    Marland Kitchen levothyroxine (SYNTHROID) 25 MCG tablet Take 1 tablet (25 mcg total) by mouth daily before breakfast. 30 tablet 0  . methotrexate (RHEUMATREX) 2.5 MG tablet Take 25 mg by mouth once a week. Caution:Chemotherapy. Protect from light.    . metoprolol tartrate (LOPRESSOR) 25 MG tablet Take 25 mg by mouth 2 (two) times daily.    . Multiple Vitamin (MULTIVITAMIN) capsule Take 1 capsule by mouth daily.    . nitroGLYCERIN (NITROSTAT) 0.4 MG SL tablet Place 1 tablet (0.4 mg total) under the tongue every 5 (five) minutes as needed for chest pain. 25  tablet 0  . omeprazole (PRILOSEC) 20 MG capsule Take 20 mg by mouth daily.    . polyethylene glycol (MIRALAX / GLYCOLAX) packet Take 17 g by mouth daily as needed for mild constipation.     . pravastatin (PRAVACHOL) 20 MG tablet Take 20 mg by mouth daily.    . sertraline (ZOLOFT) 100 MG tablet Take 50 mg by mouth daily.    . traMADol (ULTRAM) 50 MG tablet Take 50 mg by mouth every 6 (six) hours as needed for pain.    . traZODone (DESYREL) 50 MG tablet Take 1 tablet (50 mg total) by mouth at bedtime. 90 tablet 1  . Vitamin D, Ergocalciferol, (DRISDOL) 50000 units CAPS capsule Take 50,000 Units by mouth every Sunday.      No current facility-administered medications on file prior to visit.   Past Medical History:  Diagnosis Date  . Acid reflux 02/29/2016  . Acute sinusitis 03/19/2020  . Chronic idiopathic constipation 07/02/2015  . Chronic systolic congestive heart failure (HCC) 07/02/2015  . Coronary artery disease involving native coronary artery of native heart with unstable angina pectoris (HCC) 07/02/2015   Formatting of this note might be different from the original. [place]  . 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 Depression   . Diabetes (HCC) 03/01/2016  .  Diabetes mellitus without complication (HCC)   . Diet-controlled diabetes mellitus (HCC) 08/11/2015  . Dyspnea on exertion 01/16/2019  . Epigastric pain 04/08/2015  . Gait disturbance 03/15/2017  . Heart disease   . Hernia, hiatal   . Hiatal hernia 07/02/2015  . High cholesterol   . High risk medication use 04/12/2016   Methotrexate PLQ Eye Exam: 07/28/16 WNL @ [facility] Follow up in 6 months.   . History of diabetes mellitus 05/31/2016  . History of gastroesophageal reflux (GERD) 05/31/2016  . History of hypothyroidism 05/31/2016  . Hyperlipidemia 02/29/2016  . Hypertension   . Hypothyroidism   . Malaise and fatigue 07/02/2015  . Memory difficulty   . Moderate episode of recurrent major depressive disorder (HCC) 07/02/2015   . Obesity (BMI 35.0-39.9 without comorbidity) 02/29/2016  . Progressive angina (HCC) 08/11/2015  . Rheumatoid arthritis (HCC) 04/12/2016  . Spastic esophagus   . Suspected pulmonary embolism 03/11/2019   Past Surgical History:  Procedure Laterality Date  . ABDOMINAL HYSTERECTOMY    . BACK SURGERY    . CARDIAC CATHETERIZATION N/A 08/12/2015   Procedure: Left Heart Cath and Coronary Angiography;  Surgeon: Yates Decamp, MD;  Location: [facility] INVASIVE CV LAB;  Service: Cardiovascular;  Laterality: N/A;  . LEFT HEART CATH AND CORONARY ANGIOGRAPHY N/A 02/20/2018   Procedure: LEFT HEART CATH AND CORONARY ANGIOGRAPHY;  Surgeon: Lennette Bihari, MD;  Location: [facility] INVASIVE CV LAB;  Service: Cardiovascular;  Laterality: N/A;  . LEFT HEART CATH AND CORONARY ANGIOGRAPHY N/A 01/19/2020   Procedure: LEFT HEART CATH AND CORONARY ANGIOGRAPHY;  Surgeon: Marykay Lex, MD;  Location: [facility] INVASIVE CV LAB;  Service: Cardiovascular;  Laterality: N/A;  . TOTAL ABDOMINAL HYSTERECTOMY W/ BILATERAL SALPINGOOPHORECTOMY      Family History  Problem Relation Age of Onset  . Heart disease Mother   . Heart disease Father    Social History   Socioeconomic History  . Marital status: Married    Spouse name: Not on file  . Number of children: Not on file  . Years of education: Not on file  . Highest education level: Not on file  Occupational History  . Not on file  Tobacco Use  . Smoking status: Never Smoker  . Smokeless tobacco: Never Used  Vaping Use  . Vaping Use: Never used  Substance and Sexual Activity  . Alcohol use: No  . Drug use: No  . Sexual activity: Yes    Partners: Male  Other Topics Concern  . Not on file  Social History Narrative  . Not on file   Social Determinants of Health   Financial Resource Strain: Not on file  Food Insecurity: Not on file  Transportation Needs: Not on file  Physical Activity: Not on file  Stress: Not on file  Social Connections: Not on file    Review of Systems   Constitutional: Negative for activity change and appetite change.  HENT: Negative for congestion and rhinorrhea.   Eyes: Negative for visual disturbance.  Respiratory: Negative for chest tightness and shortness of breath.   Cardiovascular: Negative for chest pain, palpitations and leg swelling.  Gastrointestinal: Negative for abdominal distention and abdominal pain.  Genitourinary: Positive for urgency. Negative for difficulty urinating and dysuria.  Musculoskeletal: Negative for arthralgias and back pain.  Skin: Negative.   Neurological: Negative.   Psychiatric/Behavioral: Positive for dysphoric mood.     Objective:  BP 126/78   Pulse 92   Temp (!) 97.2 F (36.2 C)   Ht  5\' 3"  (1.6 m)   Wt 196 lb (88.9 kg)   SpO2 95%   BMI 34.72 kg/m   BP/Weight 08/31/2020 07/29/2020 07/16/2020  Systolic BP 126 130 116  Diastolic BP 78 80 66  Wt. (Lbs) 196 201 199.6  BMI 34.72 35.61 35.36    Physical Exam Vitals reviewed.  Constitutional:      Appearance: Normal appearance.  HENT:     Head: Normocephalic.     Right Ear: Tympanic membrane normal.     Left Ear: Tympanic membrane normal.     Nose: Nose normal.     Mouth/Throat:     Mouth: Mucous membranes are moist.     Pharynx: Oropharynx is clear.  Eyes:     Extraocular Movements: Extraocular movements intact.     Conjunctiva/sclera: Conjunctivae normal.     Pupils: Pupils are equal, round, and reactive to light.  Cardiovascular:     Rate and Rhythm: Normal rate and regular rhythm.     Pulses: Normal pulses.     Heart sounds: Normal heart sounds. No murmur heard. No gallop.   Pulmonary:     Effort: Pulmonary effort is normal. No respiratory distress.     Breath sounds: Normal breath sounds. No rales.  Musculoskeletal:     Cervical back: Normal range of motion and neck supple.  Neurological:     Mental Status: She is alert.     Depression screen [facility] 2/9 08/31/2020 07/29/2020  Decreased Interest 2 3  Down, Depressed,  Hopeless 1 3  PHQ - 2 Score 3 6  Altered sleeping 0 2  Tired, decreased energy 2 3  Change in appetite 1 2  Feeling bad or failure about yourself  1 3  Trouble concentrating 2 3  Moving slowly or fidgety/restless 1 2  Suicidal thoughts 1 2  PHQ-9 Score 11 23  Difficult doing work/chores Very difficult Somewhat difficult     Lab Results  Component Value Date   WBC 12.6 (H) 07/27/2020   HGB 14.5 07/27/2020   HCT 42.1 07/27/2020   PLT 204 07/27/2020   GLUCOSE 106 (H) 07/27/2020   CHOL 154 07/27/2020   TRIG 76 07/27/2020   HDL 60 07/27/2020   LDLCALC 79 07/27/2020   ALT 16 07/27/2020   AST 23 07/27/2020   NA 144 07/27/2020   K 4.3 07/27/2020   CL 107 (H) 07/27/2020   CREATININE 0.97 07/27/2020   BUN 17 07/27/2020   CO2 21 07/27/2020   TSH 1.740 07/27/2020   INR 1.07 08/12/2015   HGBA1C 6.0 (H) 07/27/2020   MICROALBUR 80 07/29/2020      Assessment & Plan:   Diagnoses and all orders for this visit: Rheumatoid arthritis of multiple sites with negative rheumatoid factor (HCC) Patient is stable on MTX  Depression, major, single episode, moderate (HCC) Patient returns for depression she just spent the last 2 weeks with her family out of town and away from her husband who says continue drinking.  Overall her PHQ-9 is improving and she thinks she is doing better.  We will continue the medicines including of the Abilify.     Follow-up: Return in about 3 months (around 11/30/2020).  An After Visit Summary was printed and given to the patient.  12/02/2020, MD [facility] [phone]

## 2020-10-07 ENCOUNTER — Other Ambulatory Visit: Payer: Self-pay | Admitting: Legal Medicine

## 2020-10-07 DIAGNOSIS — E039 Hypothyroidism, unspecified: Secondary | ICD-10-CM

## 2020-10-20 DIAGNOSIS — R42 Dizziness and giddiness: Secondary | ICD-10-CM | POA: Diagnosis not present

## 2020-10-20 DIAGNOSIS — M069 Rheumatoid arthritis, unspecified: Secondary | ICD-10-CM | POA: Diagnosis not present

## 2020-10-20 DIAGNOSIS — Z6836 Body mass index (BMI) 36.0-36.9, adult: Secondary | ICD-10-CM | POA: Diagnosis not present

## 2020-10-20 DIAGNOSIS — M150 Primary generalized (osteo)arthritis: Secondary | ICD-10-CM | POA: Diagnosis not present

## 2020-10-20 DIAGNOSIS — R682 Dry mouth, unspecified: Secondary | ICD-10-CM | POA: Diagnosis not present

## 2020-10-20 DIAGNOSIS — G894 Chronic pain syndrome: Secondary | ICD-10-CM | POA: Diagnosis not present

## 2020-10-20 DIAGNOSIS — F5101 Primary insomnia: Secondary | ICD-10-CM | POA: Diagnosis not present

## 2020-10-20 DIAGNOSIS — M65341 Trigger finger, right ring finger: Secondary | ICD-10-CM | POA: Diagnosis not present

## 2020-10-20 DIAGNOSIS — M5416 Radiculopathy, lumbar region: Secondary | ICD-10-CM | POA: Diagnosis not present

## 2020-11-30 ENCOUNTER — Ambulatory Visit (INDEPENDENT_AMBULATORY_CARE_PROVIDER_SITE_OTHER): Payer: Medicare HMO | Admitting: Legal Medicine

## 2020-11-30 ENCOUNTER — Other Ambulatory Visit: Payer: Self-pay

## 2020-11-30 ENCOUNTER — Encounter: Payer: Self-pay | Admitting: Legal Medicine

## 2020-11-30 VITALS — BP 130/80 | HR 73 | Temp 97.8°F | Ht 63.0 in | Wt 200.0 lb

## 2020-11-30 DIAGNOSIS — I10 Essential (primary) hypertension: Secondary | ICD-10-CM | POA: Diagnosis not present

## 2020-11-30 DIAGNOSIS — G894 Chronic pain syndrome: Secondary | ICD-10-CM | POA: Insufficient documentation

## 2020-11-30 DIAGNOSIS — I152 Hypertension secondary to endocrine disorders: Secondary | ICD-10-CM

## 2020-11-30 DIAGNOSIS — E1159 Type 2 diabetes mellitus with other circulatory complications: Secondary | ICD-10-CM | POA: Diagnosis not present

## 2020-11-30 DIAGNOSIS — E782 Mixed hyperlipidemia: Secondary | ICD-10-CM

## 2020-11-30 DIAGNOSIS — E1169 Type 2 diabetes mellitus with other specified complication: Secondary | ICD-10-CM

## 2020-11-30 DIAGNOSIS — F4310 Post-traumatic stress disorder, unspecified: Secondary | ICD-10-CM

## 2020-11-30 DIAGNOSIS — E039 Hypothyroidism, unspecified: Secondary | ICD-10-CM | POA: Diagnosis not present

## 2020-11-30 DIAGNOSIS — E669 Obesity, unspecified: Secondary | ICD-10-CM

## 2020-11-30 DIAGNOSIS — M0609 Rheumatoid arthritis without rheumatoid factor, multiple sites: Secondary | ICD-10-CM

## 2020-11-30 DIAGNOSIS — F32A Depression, unspecified: Secondary | ICD-10-CM | POA: Diagnosis not present

## 2020-11-30 DIAGNOSIS — F321 Major depressive disorder, single episode, moderate: Secondary | ICD-10-CM

## 2020-11-30 HISTORY — DX: Chronic pain syndrome: G89.4

## 2020-11-30 HISTORY — DX: Post-traumatic stress disorder, unspecified: F43.10
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
## 2020-12-01 ENCOUNTER — Telehealth: Payer: Self-pay | Admitting: Legal Medicine

## 2020-12-01 ENCOUNTER — Telehealth: Payer: Self-pay | Admitting: *Deleted

## 2020-12-01 LAB — COMPREHENSIVE METABOLIC PANEL
ALT: 18 IU/L (ref 0–32)
AST: 26 IU/L (ref 0–40)
Albumin/Globulin Ratio: 1.4 (ref 1.2–2.2)
Albumin: 4.2 g/dL (ref 3.7–4.7)
Alkaline Phosphatase: 154 IU/L — ABNORMAL HIGH (ref 44–121)
BUN/Creatinine Ratio: 11 — ABNORMAL LOW (ref 12–28)
BUN: 10 mg/dL (ref 8–27)
Bilirubin Total: 0.4 mg/dL (ref 0.0–1.2)
CO2: 25 mmol/L (ref 20–29)
Calcium: 9.7 mg/dL (ref 8.7–10.3)
Chloride: 106 mmol/L (ref 96–106)
Creatinine, Ser: 0.93 mg/dL (ref 0.57–1.00)
Globulin, Total: 2.9 g/dL (ref 1.5–4.5)
Glucose: 84 mg/dL (ref 65–99)
Potassium: 4.4 mmol/L (ref 3.5–5.2)
Sodium: 141 mmol/L (ref 134–144)
Total Protein: 7.1 g/dL (ref 6.0–8.5)
eGFR: 64 mL/min/{1.73_m2} (ref >59)

## 2020-12-01 LAB — CARDIOVASCULAR RISK ASSESSMENT

## 2020-12-01 LAB — HEMOGLOBIN A1C
Est. average glucose Bld gHb Est-mCnc: 120 mg/dL
Hgb A1c MFr Bld: 5.8 % — ABNORMAL HIGH (ref 4.8–5.6)

## 2020-12-01 LAB — CBC WITH DIFFERENTIAL/PLATELET
Basophils Absolute: 0.1 10*3/uL (ref 0.0–0.2)
Basos: 1 %
EOS (ABSOLUTE): 0.1 10*3/uL (ref 0.0–0.4)
Eos: 2 %
Hematocrit: 48.1 % — ABNORMAL HIGH (ref 34.0–46.6)
Hemoglobin: 16.4 g/dL — ABNORMAL HIGH (ref 11.1–15.9)
Immature Grans (Abs): 0 10*3/uL (ref 0.0–0.1)
Immature Granulocytes: 0 %
Lymphocytes Absolute: 3.4 10*3/uL — ABNORMAL HIGH (ref 0.7–3.1)
Lymphs: 39 %
MCH: 31.8 pg (ref 26.6–33.0)
MCHC: 34.1 g/dL (ref 31.5–35.7)
MCV: 93 fL (ref 79–97)
Monocytes Absolute: 0.6 10*3/uL (ref 0.1–0.9)
Monocytes: 7 %
Neutrophils Absolute: 4.6 10*3/uL (ref 1.4–7.0)
Neutrophils: 51 %
Platelets: 214 10*3/uL (ref 150–450)
RBC: 5.15 x10E6/uL (ref 3.77–5.28)
RDW: 13.9 % (ref 11.7–15.4)
WBC: 8.9 10*3/uL (ref 3.4–10.8)

## 2020-12-01 LAB — LIPID PANEL
Chol/HDL Ratio: 3 ratio (ref 0.0–4.4)
Cholesterol, Total: 170 mg/dL (ref 100–199)
HDL: 56 mg/dL (ref >39)
LDL Chol Calc (NIH): 96 mg/dL (ref 0–99)
Triglycerides: 101 mg/dL (ref 0–149)
VLDL Cholesterol Cal: 18 mg/dL (ref 5–40)

## 2020-12-01 LAB — TSH: TSH: 2.89 u[IU]/mL (ref 0.450–4.500)

## 2020-12-01 NOTE — Progress Notes (Signed)
Hemoglobin and hematocrit high, kidney and liver tests normal, A1c 5.8 good, cholesterol normal, TSH 2.89 normal,  lp

## 2020-12-01 NOTE — Chronic Care Management (AMB) (Signed)
  Chronic Care Management   Outreach Note  12/01/2020 Name: Deborah Farley MRN: [id number] DOB: [birth year]  Referred by: Abigail Miyamoto, MD Reason for referral : No chief complaint on file.   An unsuccessful telephone outreach was attempted today. The patient was referred to the pharmacist for assistance with care management and care coordination.   Follow Up Plan:   Tatjana Dellinger Upstream Scheduler

## 2020-12-01 NOTE — Chronic Care Management (AMB) (Signed)
  Chronic Care Management   Note  12/01/2020 Name: Deborah Farley MRN: [id number] DOB: 03-30-1946  Deborah Farley is a 75 y.o. year old female who is a primary care patient of Lillard Anes, MD. I reached out to Deborah Farley by phone today in response to a referral sent by Deborah Farley's PCP, Dr. Henrene Pastor      Deborah Farley was given information about Chronic Care Management services today including:  CCM service includes personalized support from designated clinical staff supervised by her physician, including individualized plan of care and coordination with other care providers 24/7 contact phone numbers for assistance for urgent and routine care needs. Service will only be billed when office clinical staff spend 20 minutes or more in a month to coordinate care. Only one practitioner may furnish and bill the service in a calendar month. The patient may stop CCM services at any time (effective at the end of the month) by phone call to the office staff. The patient will be responsible for cost sharing (co-pay) of up to 20% of the service fee (after annual deductible is met).  Patient agreed to services and verbal consent obtained.   Follow up plan: Telephone appointment with care management team member scheduled for:12/28/2020  [facility]  Direct Dial: [phone]

## 2020-12-08 ENCOUNTER — Telehealth: Payer: Self-pay | Admitting: Legal Medicine

## 2020-12-08 NOTE — Chronic Care Management (AMB) (Signed)
  Chronic Care Management   Outreach Note  12/08/2020 Name: [place] MRN: [id number] DOB: 11-03-1945  Referred by: Abigail Miyamoto, MD Reason for referral : No chief complaint on file.   A second unsuccessful telephone outreach was attempted today. The patient was referred to pharmacist for assistance with care management and care coordination.  Follow Up Plan:   Tatjana Dellinger Upstream Scheduler

## 2020-12-14 ENCOUNTER — Telehealth: Payer: Self-pay | Admitting: Legal Medicine

## 2020-12-14 NOTE — Chronic Care Management (AMB) (Signed)
  Chronic Care Management   Note  12/14/2020 Name: Deborah Farley MRN: [id number] DOB: [birth year]  [name] is a 75 y.o. year old female who is a primary care patient of Abigail Miyamoto, MD. I reached out to [name] by phone today in response to a referral sent by [name]'s PCP, Abigail Miyamoto, MD.   [name] was given information about Chronic Care Management services today including:  CCM service includes personalized support from designated clinical staff supervised by her physician, including individualized plan of care and coordination with other care providers 24/7 contact phone numbers for assistance for urgent and routine care needs. Service will only be billed when office clinical staff spend 20 minutes or more in a month to coordinate care. Only one practitioner may furnish and bill the service in a calendar month. The patient may stop CCM services at any time (effective at the end of the month) by phone call to the office staff.   Patient agreed to services and verbal consent obtained.   Follow up plan:   [name], fast food

## 2020-12-28 ENCOUNTER — Ambulatory Visit (INDEPENDENT_AMBULATORY_CARE_PROVIDER_SITE_OTHER): Payer: Medicare HMO
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
## 2020-12-28 NOTE — Progress Notes (Signed)
Scheduled 8/24  [facility] Guide, Embedded Care Coordination [facility]  Care Management  Direct Dial: [phone]
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
## 2020-12-29 ENCOUNTER — Ambulatory Visit: Payer: Medicare HMO | Admitting: *Deleted

## 2020-12-29 DIAGNOSIS — F321 Major depressive disorder, single episode, moderate: Secondary | ICD-10-CM

## 2020-12-29 DIAGNOSIS — R413 Other amnesia: Secondary | ICD-10-CM

## 2020-12-29 DIAGNOSIS — E669 Obesity, unspecified: Secondary | ICD-10-CM

## 2020-12-29 DIAGNOSIS — I10 Essential (primary) hypertension: Secondary | ICD-10-CM

## 2020-12-29 DIAGNOSIS — F331 Major depressive disorder, recurrent, moderate: Secondary | ICD-10-CM

## 2020-12-29 DIAGNOSIS — F32A Depression, unspecified: Secondary | ICD-10-CM
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
## 2020-12-30 ENCOUNTER — Other Ambulatory Visit: Payer: Self-pay

## 2020-12-30 ENCOUNTER — Encounter: Payer: Self-pay | Admitting: Legal Medicine

## 2020-12-30 ENCOUNTER — Ambulatory Visit (INDEPENDENT_AMBULATORY_CARE_PROVIDER_SITE_OTHER): Payer: Medicare HMO | Admitting: Legal Medicine

## 2020-12-30 VITALS — BP 126/82 | HR 62 | Temp 97.4°F | Ht 63.0 in | Wt 204.6 lb

## 2020-12-30 DIAGNOSIS — F4310 Post-traumatic stress disorder, unspecified: Secondary | ICD-10-CM | POA: Diagnosis not present

## 2020-12-30 DIAGNOSIS — F331 Major depressive disorder, recurrent, moderate: Secondary | ICD-10-CM | POA: Diagnosis not present

## 2020-12-30 DIAGNOSIS — K2100 Gastro-esophageal reflux disease with esophagitis, without bleeding: Secondary | ICD-10-CM | POA: Diagnosis not present

## 2020-12-30 DIAGNOSIS — K224 Dyskinesia of esophagus: Secondary | ICD-10-CM

## 2020-12-30 MED ORDER — OMEPRAZOLE 40 MG PO CPDR: 40.0000 mg | DELAYED_RELEASE_CAPSULE | Freq: Two times a day (BID) | ORAL | 3 refills | Status: DC
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
## 2021-01-05 DIAGNOSIS — F321 Major depressive disorder, single episode, moderate: Secondary | ICD-10-CM

## 2021-01-05 DIAGNOSIS — E7849 Other hyperlipidemia: Secondary | ICD-10-CM | POA: Diagnosis not present

## 2021-01-05 DIAGNOSIS — F331 Major depressive disorder, recurrent, moderate: Secondary | ICD-10-CM

## 2021-01-05 DIAGNOSIS — E7800 Pure hypercholesterolemia, unspecified: Secondary | ICD-10-CM | POA: Diagnosis not present

## 2021-01-05 DIAGNOSIS — F32A Depression, unspecified: Secondary | ICD-10-CM | POA: Diagnosis not present

## 2021-01-05 DIAGNOSIS — I10 Essential (primary) hypertension: Secondary | ICD-10-CM | POA: Diagnosis not present

## 2021-01-06 ENCOUNTER — Ambulatory Visit (INDEPENDENT_AMBULATORY_CARE_PROVIDER_SITE_OTHER): Payer: Medicare HMO | Admitting: *Deleted

## 2021-01-06 DIAGNOSIS — R5383 Other fatigue: Secondary | ICD-10-CM

## 2021-01-06 DIAGNOSIS — F4310 Post-traumatic stress disorder, unspecified: Secondary | ICD-10-CM

## 2021-01-06 DIAGNOSIS — R5381 Other malaise: Secondary | ICD-10-CM

## 2021-01-06 DIAGNOSIS — F321 Major depressive disorder, single episode, moderate: Secondary | ICD-10-CM

## 2021-01-06 DIAGNOSIS — R269 Unspecified abnormalities of gait and mobility: Secondary | ICD-10-CM

## 2021-01-06 DIAGNOSIS — F32A Depression, unspecified: Secondary | ICD-10-CM

## 2021-01-06 DIAGNOSIS — E119 Type 2 diabetes mellitus without complications: Secondary | ICD-10-CM

## 2021-01-06 DIAGNOSIS — K224 Dyskinesia of esophagus: Secondary | ICD-10-CM

## 2021-01-06 DIAGNOSIS — F331 Major depressive disorder, recurrent, moderate: Secondary | ICD-10-CM
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
## 2021-01-07 ENCOUNTER — Ambulatory Visit: Payer: Medicare HMO

## 2021-01-17 ENCOUNTER — Ambulatory Visit: Payer: Medicare HMO | Admitting: *Deleted

## 2021-01-17 ENCOUNTER — Ambulatory Visit: Payer: Medicare HMO | Admitting: Cardiology

## 2021-01-17 DIAGNOSIS — F4310 Post-traumatic stress disorder, unspecified: Secondary | ICD-10-CM

## 2021-01-17 DIAGNOSIS — F331 Major depressive disorder, recurrent, moderate: Secondary | ICD-10-CM

## 2021-01-17 DIAGNOSIS — F32A Depression, unspecified: Secondary | ICD-10-CM

## 2021-01-17 DIAGNOSIS — E119 Type 2 diabetes mellitus without complications: Secondary | ICD-10-CM

## 2021-01-17 DIAGNOSIS — F321 Major depressive disorder, single episode, moderate: Secondary | ICD-10-CM

## 2021-01-17 DIAGNOSIS — I10 Essential (primary) hypertension: Secondary | ICD-10-CM
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
## 2021-01-24 ENCOUNTER — Ambulatory Visit: Payer: Medicare HMO | Admitting: *Deleted

## 2021-01-24 DIAGNOSIS — R413 Other amnesia: Secondary | ICD-10-CM

## 2021-01-24 DIAGNOSIS — F32A Depression, unspecified: Secondary | ICD-10-CM

## 2021-01-24 DIAGNOSIS — Z8639 Personal history of other endocrine, nutritional and metabolic disease: Secondary | ICD-10-CM

## 2021-01-24 DIAGNOSIS — K224 Dyskinesia of esophagus: Secondary | ICD-10-CM

## 2021-01-24 DIAGNOSIS — F331 Major depressive disorder, recurrent, moderate: Secondary | ICD-10-CM

## 2021-01-24 DIAGNOSIS — R269 Unspecified abnormalities of gait and mobility: Secondary | ICD-10-CM

## 2021-01-24 DIAGNOSIS — E7849 Other hyperlipidemia: Secondary | ICD-10-CM

## 2021-01-24 DIAGNOSIS — F321 Major depressive disorder, single episode, moderate: Secondary | ICD-10-CM

## 2021-01-24 DIAGNOSIS — F4310 Post-traumatic stress disorder, unspecified: Secondary | ICD-10-CM

## 2021-01-24 NOTE — Patient Instructions (Signed)
Visit Information  PATIENT GOALS:  Goals Addressed               This Visit's Progress     Improve My Quality of Life. (pt-stated)   On track     Timeframe:  Long-Range Goal Priority:  High Start Date:   12/29/2020                       Expected End Date:   03/31/2021                 Follow-Up Date: 02/21/2021 at 9:00am  Patient Goals/Self-Care Activities: Continue to receive personal counseling with LCSW on a weekly/bi-weekly basis, to reduce and manage symptoms of Anxiety, Depression, Post-Traumatic Stress Disorder, Chronic Pain, Sheltered Homelessness and Marital Discord, until well-established with [facility]. Follow-up with Tollie Eth, Licensed Clinical Social Worker with [facility] at [facility] in [facility], [facility] [phone]), upon your return from [place], on 02/18/2021, to continue to establish ongoing mental health counseling and supportive services through [facility].  Continue to attend at least one Al-Anon or AA Meeting per week, from the list provided, to obtain peer support, education and a better understanding of the disease process, encouraging husband to also be in attendance.  Contact LCSW directly (# [phone]) if you have questions, need assistance, or if additional social work needs are identified between now and our next scheduled telephone outreach call.        Patient verbalizes understanding of instructions provided today and agrees to view in MyChart.   Telephone follow-up appointment with care management team member scheduled for:  02/21/2021 at 9:00am  Danford Bad LCSW Licensed Clinical Social Worker [facility] [phone]

## 2021-01-24 NOTE — Chronic Care Management (AMB) (Signed)
Chronic Care Management    Clinical Social Work Note  01/24/2021 Name: Deborah Farley MRN: [id number] DOB: 1945-07-19  Deborah Farley is a 75 y.o. year old female who is a primary care patient of Abigail Miyamoto, MD. The CCM team was consulted to assist the patient with chronic disease management and/or care coordination needs related to: Mental Health Counseling and Resources.   Engaged with patient by telephone for follow-up visit in response to provider referral for social work chronic care management and care coordination services.   Consent to Services:  The patient was given information about Chronic Care Management services, agreed to services, and gave verbal consent prior to initiation of services.  Please see initial visit note for detailed documentation.   Patient agreed to services and consent obtained.   Assessment: Review of patient past medical history, allergies, medications, and health status, including review of relevant consultants reports was performed today as part of a comprehensive evaluation and provision of chronic care management and care coordination services.     SDOH (Social Determinants of Health) assessments and interventions performed:    Advanced Directives Status: Not addressed in this encounter.  CCM Care Plan  Allergies  Allergen Reactions   Codeine Anaphylaxis   Iodine Anaphylaxis   Shellfish Allergy     Anaphylactic     Outpatient Encounter Medications as of 01/24/2021  Medication Sig   ALPRAZolam (XANAX) 0.5 MG tablet Take 0.25 mg by mouth daily as needed for anxiety.   ARIPiprazole (ABILIFY) 2 MG tablet Take 1 tablet (2 mg total) by mouth daily.   aspirin EC 81 MG tablet Take 81 mg by mouth daily.   bumetanide (BUMEX) 1 MG tablet Take 1 mg by mouth daily as needed (swelling).    buPROPion (WELLBUTRIN XL) 300 MG 24 hr tablet Take 300 mg by mouth at bedtime.    Cholecalciferol (VITAMIN D) 2000 units CAPS Take 2,000 Units by mouth  daily.   diltiazem (CARDIZEM CD) 120 MG 24 hr capsule Take 1 capsule (120 mg total) by mouth daily.   folic acid (FOLVITE) 400 MCG tablet Take 400 mcg by mouth daily.   isosorbide mononitrate (IMDUR) 30 MG 24 hr tablet Take 30 mg by mouth daily.   latanoprost (XALATAN) 0.005 % ophthalmic solution Place 1 drop into both eyes at bedtime.   levothyroxine (SYNTHROID) 25 MCG tablet TAKE 1 TABLET (25 MCG TOTAL) BY MOUTH DAILY BEFORE BREAKFAST.   Methotrexate 25 MG/ML SOSY Inject 25 mg into the skin every 7 (seven) days.   metoprolol tartrate (LOPRESSOR) 25 MG tablet Take 25 mg by mouth 2 (two) times daily.   Multiple Vitamin (MULTIVITAMIN) capsule Take 1 capsule by mouth daily.   nitroGLYCERIN (NITROSTAT) 0.4 MG SL tablet Place 1 tablet (0.4 mg total) under the tongue every 5 (five) minutes as needed for chest pain.   omeprazole (PRILOSEC) 40 MG capsule Take 1 capsule (40 mg total) by mouth 2 (two) times daily.   polyethylene glycol (MIRALAX / GLYCOLAX) packet Take 17 g by mouth daily as needed for mild constipation.    pravastatin (PRAVACHOL) 20 MG tablet Take 20 mg by mouth daily.   sertraline (ZOLOFT) 100 MG tablet Take 50 mg by mouth daily.   traMADol (ULTRAM) 50 MG tablet Take 50 mg by mouth every 6 (six) hours as needed for pain.   traZODone (DESYREL) 50 MG tablet Take 1 tablet (50 mg total) by mouth at bedtime.   Vitamin D, Ergocalciferol, (DRISDOL) 50000 units CAPS capsule Take  50,000 Units by mouth every Sunday.    No facility-administered encounter medications on file as of 01/24/2021.    Patient Active Problem List   Diagnosis Date Noted   Chronic pain syndrome 11/30/2020   PTSD (post-traumatic stress disorder) 11/30/2020   Depression, major, single episode, moderate (HCC) 08/31/2020   Obesity, diabetes, and hypertension syndrome (HCC) 07/29/2020   Diabetes mellitus without complication (HCC)    Hernia, hiatal    High cholesterol    Memory difficulty    Spastic esophagus    Acute  sinusitis 03/19/2020   Suspected pulmonary embolism 03/11/2019   Dyspnea on exertion 01/16/2019   Gait disturbance 03/15/2017   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   History of diabetes mellitus 05/31/2016   History of hypothyroidism 05/31/2016   History of gastroesophageal reflux (GERD) 05/31/2016   Rheumatoid arthritis (HCC) 04/12/2016   High risk medication use 04/12/2016   Hyperlipidemia 02/29/2016   Hypertension 02/29/2016   Hypothyroidism 02/29/2016   Obesity (BMI 35.0-39.9 without comorbidity) 02/29/2016   Acid reflux 02/29/2016   Progressive angina (HCC) 08/11/2015   Diet-controlled diabetes mellitus (HCC) 08/11/2015   Chronic idiopathic constipation 07/02/2015   Chronic systolic congestive heart failure (HCC) 07/02/2015   Hiatal hernia 07/02/2015   Malaise and fatigue 07/02/2015   Major depressive disorder, recurrent episode, moderate (HCC) 07/02/2015   Coronary artery disease involving native coronary artery of native heart with unstable angina pectoris (HCC) 07/02/2015   Epigastric pain 04/08/2015    Conditions to be addressed/monitored: Anxiety, Depression, and Post-Traumatic Stress Disorder.  Mental Health Concerns, Family and Relationship Dysfunction, Marital Discord, Social Isolation, and Lacks Knowledge of [employer].  Care Plan : LCSW Plan of Care  Updates made by [name], LCSW since 01/24/2021 12:00 AM     Problem: Improve My Quality of Life.   Priority: High     Long-Range Goal: Improve My Quality of Life.   Start Date: 12/29/2020  Expected End Date: 03/31/2021  This Visit's Progress: On track  Recent Progress: On track  Priority: High  Note:   Current Barriers:   Acute Mental Health needs related to Anxiety, Depression, Post-Traumatic Stress Disorder, Chronic Pain, Sheltered Homelessness and Marital Discord. Needs Support, Education, Resources, Referrals and Care Coordination in order to meet unmet mental health and housing  needs. Chronic Pain Symptoms are exacerbated by Anxiety, Depression, Post-Traumatic Stress Disorder, Sheltered Homelessness and Marital Discord.   Clinical Goal(s):  Patient will work with LCSW to reduce and manage symptoms of Anxiety, Depression, Post-Traumatic Stress Disorder, Chronic Pain, Sheltered Homelessness and Marital Discord. Patient will increase knowledge and/or ability of:        Coping Skills, Healthy Habits, Self-Management Skills, Stress Reduction, Home Safety and Utilization of [employer] and Resources. Clinical Interventions:  Assessed patient's previous treatment, needs, coping skills, current treatment, support system and barriers to care. PHQ-2 and PHQ-9 Depression Screening Tool performed and results reviewed with patient. Other interventions included: Solution-Focused Therapy Performed, Active Listening/Reflection Utilized, Emotional Support Provided, Motivational Interviewing, Brief Cognitive Behavioral  Therapy Initiated. Collaboration with Primary Care Physician, Dr. Brent Bulla regarding development and update of comprehensive plan of care as evidenced by provider attestation and co-signature. Inter-disciplinary care team collaboration (see longitudinal plan of care). Patient Goals/Self-Care Activities: Continue to receive personal counseling with LCSW on a weekly/bi-weekly basis, to reduce and manage symptoms of Anxiety, Depression, Post-Traumatic Stress Disorder, Chronic Pain, Sheltered Homelessness and Marital Discord, until well-established with [facility]. Follow-up with Tollie Eth, Licensed Clinical Social Worker with [facility]  at [facility] in [employer], [facility] (# [phone]), upon your return from [place], on 02/18/2021, to continue to establish ongoing mental health counseling and supportive services through [facility].  Continue to attend at least one Al-Anon or AA Meeting per week, from the list provided, to obtain  peer support, education and a better understanding of the disease process, encouraging husband to also be in attendance.  Contact LCSW directly (# [phone]) if you have questions, need assistance, or if additional social work needs are identified between now and our next scheduled telephone outreach call. Follow-Up:  02/21/2021 at 9:00am       Follow-Up Plan:  02/21/2021 at 9:00am      Danford Bad LCSW Licensed Clinical Social Worker [facility] [phone]

## 2021-02-02 ENCOUNTER — Ambulatory Visit: Payer: Medicare HMO

## 2021-02-04 DIAGNOSIS — F321 Major depressive disorder, single episode, moderate: Secondary | ICD-10-CM | POA: Diagnosis not present

## 2021-02-04 DIAGNOSIS — E7849 Other hyperlipidemia: Secondary | ICD-10-CM | POA: Diagnosis not present

## 2021-02-04 DIAGNOSIS — F32A Depression, unspecified: Secondary | ICD-10-CM | POA: Diagnosis not present

## 2021-02-04 DIAGNOSIS — F331 Major depressive disorder, recurrent, moderate: Secondary | ICD-10-CM | POA: Diagnosis not present

## 2021-02-04 DIAGNOSIS — I10 Essential (primary) hypertension: Secondary | ICD-10-CM

## 2021-02-04 DIAGNOSIS — E119 Type 2 diabetes mellitus without complications: Secondary | ICD-10-CM | POA: Diagnosis not present

## 2021-02-17 ENCOUNTER — Other Ambulatory Visit: Payer: Self-pay

## 2021-02-17 ENCOUNTER — Telehealth: Payer: Self-pay

## 2021-02-17 DIAGNOSIS — F32A Depression, unspecified: Secondary | ICD-10-CM | POA: Insufficient documentation

## 2021-02-17 DIAGNOSIS — F332 Major depressive disorder, recurrent severe without psychotic features: Secondary | ICD-10-CM

## 2021-02-17 HISTORY — DX: Major depressive disorder, recurrent severe without psychotic features: F33.2

## 2021-02-17 NOTE — Chronic Care Management (AMB) (Signed)
Chronic Care Management Pharmacy Assistant   Name: Deborah Farley  MRN: [id number] DOB: 03-01-1946   Reason for Encounter: Chart Prep for initial visit with CPP on 02/25/21   Conditions to be addressed/monitored: HTN, CHF, CAD, Obesity, Gerd, Chronic constipation, Hypothyroidism, DM, Rheumatoid Arthritis, HLD, PTSD, Depression, Spastic Esophagus    Recent office visits:  12/30/20 Brent Bulla MD. Seen Spastic Esophagus. Referred to Gastroenterology. Increased Omeprazole 20 mg 2 times daily to 40 mg 2 times daily. D/C Methotrexate Sodium 25 mg due to change in therapy. Follow up in 3 months.  11/30/20 Brent Bulla MD. Seen for depression. Referral to [facility] Coordination. No med changes. Follow up in 1 month.  08/31/20 Brent Bulla MD. Seen for Rheumatoid Arthritis.No med changes. Follow up in 3 months.  Recent consult visits:  10/20/20 No notes available   Hospital visits:  None in previous 6 months  Medications: Outpatient Encounter Medications as of 02/17/2021  Medication Sig   ALPRAZolam (XANAX) 0.5 MG tablet Take 0.25 mg by mouth daily as needed for anxiety.   ARIPiprazole (ABILIFY) 2 MG tablet Take 1 tablet (2 mg total) by mouth daily.   aspirin EC 81 MG tablet Take 81 mg by mouth daily.   bumetanide (BUMEX) 1 MG tablet Take 1 mg by mouth daily as needed (swelling).    buPROPion (WELLBUTRIN XL) 300 MG 24 hr tablet Take 300 mg by mouth at bedtime.    Cholecalciferol (VITAMIN D) 2000 units CAPS Take 2,000 Units by mouth daily.   diltiazem (CARDIZEM CD) 120 MG 24 hr capsule Take 1 capsule (120 mg total) by mouth daily.   folic acid (FOLVITE) 400 MCG tablet Take 400 mcg by mouth daily.   isosorbide mononitrate (IMDUR) 30 MG 24 hr tablet Take 30 mg by mouth daily.   latanoprost (XALATAN) 0.005 % ophthalmic solution Place 1 drop into both eyes at bedtime.   levothyroxine (SYNTHROID) 25 MCG tablet TAKE 1 TABLET (25 MCG TOTAL) BY MOUTH DAILY BEFORE BREAKFAST.    Methotrexate 25 MG/ML SOSY Inject 25 mg into the skin every 7 (seven) days.   metoprolol tartrate (LOPRESSOR) 25 MG tablet Take 25 mg by mouth 2 (two) times daily.   Multiple Vitamin (MULTIVITAMIN) capsule Take 1 capsule by mouth daily.   nitroGLYCERIN (NITROSTAT) 0.4 MG SL tablet Place 1 tablet (0.4 mg total) under the tongue every 5 (five) minutes as needed for chest pain.   omeprazole (PRILOSEC) 40 MG capsule Take 1 capsule (40 mg total) by mouth 2 (two) times daily.   polyethylene glycol (MIRALAX / GLYCOLAX) packet Take 17 g by mouth daily as needed for mild constipation.    pravastatin (PRAVACHOL) 20 MG tablet Take 20 mg by mouth daily.   sertraline (ZOLOFT) 100 MG tablet Take 50 mg by mouth daily.   traMADol (ULTRAM) 50 MG tablet Take 50 mg by mouth every 6 (six) hours as needed for pain.   traZODone (DESYREL) 50 MG tablet Take 1 tablet (50 mg total) by mouth at bedtime.   Vitamin D, Ergocalciferol, (DRISDOL) 50000 units CAPS capsule Take 50,000 Units by mouth every Sunday.    No facility-administered encounter medications on file as of 02/17/2021.    Lab Results  Component Value Date/Time   HGBA1C 5.8 (H) 11/30/2020 02:55 PM   HGBA1C 6.0 (H) 07/27/2020 09:06 AM   MICROALBUR 80 07/29/2020 09:41 AM     BP Readings from Last 3 Encounters:  12/30/20 126/82  11/30/20 130/80  08/31/20 126/78  Patient Questions:  Have you seen any other providers since your last visit with PCP?   Any changes in your medications or health?   Any side effects from any medications?   Do you have an symptoms or problems not managed by your medications?   Any concerns about your health right now?   Has your provider asked that you check blood pressure, blood sugar, or follow special diet at home?   Do you get any type of exercise on a regular basis?   Can you think of a goal you would like to reach for your health?   Do you have any problems getting your medications?   Is there anything  that you would like to discuss during the appointment?    Deborah Farley was reminded to have all medications, supplements and any blood glucose and blood pressure readings available for review with Deborah Farley, Pharm. D, at her telephone visit on 02/25/21 at 2:00 pm .    Star Rating Drugs:  Medication:  Last Fill: Day Supply Pravastatin   11/08/19  90ds    Care Gaps: Last annual wellness visit? None noted  If applicable: Last eye exam / retinopathy screening? Done on 07/20/20 Last diabetic foot exam? Done on 07/29/20 Colonoscopy: Never done     Deborah Farley, [facility] Clinical Pharmacist Assistant  [phone]  3 attempts have been made to completed this call. All were unsuccessful

## 2021-02-21 ENCOUNTER — Ambulatory Visit (INDEPENDENT_AMBULATORY_CARE_PROVIDER_SITE_OTHER): Payer: Medicare HMO | Admitting: *Deleted

## 2021-02-21 DIAGNOSIS — R269 Unspecified abnormalities of gait and mobility: Secondary | ICD-10-CM

## 2021-02-21 DIAGNOSIS — M069 Rheumatoid arthritis, unspecified: Secondary | ICD-10-CM | POA: Diagnosis not present

## 2021-02-21 DIAGNOSIS — F4310 Post-traumatic stress disorder, unspecified: Secondary | ICD-10-CM

## 2021-02-21 DIAGNOSIS — F32A Depression, unspecified: Secondary | ICD-10-CM

## 2021-02-21 DIAGNOSIS — R413 Other amnesia: Secondary | ICD-10-CM

## 2021-02-21 DIAGNOSIS — Z8639 Personal history of other endocrine, nutritional and metabolic disease: Secondary | ICD-10-CM

## 2021-02-21 DIAGNOSIS — E669 Obesity, unspecified: Secondary | ICD-10-CM | POA: Diagnosis not present

## 2021-02-21 DIAGNOSIS — M150 Primary generalized (osteo)arthritis: Secondary | ICD-10-CM | POA: Diagnosis not present

## 2021-02-21 DIAGNOSIS — R682 Dry mouth, unspecified: Secondary | ICD-10-CM | POA: Diagnosis not present

## 2021-02-21 DIAGNOSIS — F321 Major depressive disorder, single episode, moderate: Secondary | ICD-10-CM

## 2021-02-21 DIAGNOSIS — R0789 Other chest pain: Secondary | ICD-10-CM | POA: Diagnosis not present

## 2021-02-21 DIAGNOSIS — M65341 Trigger finger, right ring finger: Secondary | ICD-10-CM | POA: Diagnosis not present

## 2021-02-21 DIAGNOSIS — Z6836 Body mass index (BMI) 36.0-36.9, adult: Secondary | ICD-10-CM | POA: Diagnosis not present

## 2021-02-21 DIAGNOSIS — F331 Major depressive disorder, recurrent, moderate: Secondary | ICD-10-CM
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
## 2021-02-22 ENCOUNTER — Ambulatory Visit: Payer: Medicare HMO

## 2021-02-22 DIAGNOSIS — I10 Essential (primary) hypertension: Secondary | ICD-10-CM

## 2021-02-22 DIAGNOSIS — F331 Major depressive disorder, recurrent, moderate: Secondary | ICD-10-CM

## 2021-02-22 NOTE — Progress Notes (Signed)
Patient did not want to be seen

## 2021-02-22 NOTE — Patient Instructions (Signed)
Visit Information  PATIENT GOALS:  Goals Addressed             This Visit's Progress    Manage My Emotions   On track    Timeframe:  Long-Range Goal Priority:  High Start Date:            12/28/2020                 Expected End Date:      12/28/2021                 Follow Up Date 04/26/2021   - begin personal counseling - talk about feelings with a friend, family or spiritual advisor - practice positive thinking and self-talk -call 911 in case of emergency or feelings of suicide.  -stay safe -consider options suggested by social worker   Why is this important?   When you are stressed, down or upset, your body reacts too.  For example, your blood pressure may get higher; you may have a headache or stomachache.  When your emotions get the best of you, your body's ability to fight off cold and flu gets weak.  These steps will help you manage your emotions.         Track and Manage My Blood Pressure-Hypertension   On track    Timeframe:  Long-Range Goal Priority:  Medium Start Date:         12/28/2020                    Expected End Date:                       Follow Up Date 04/26/2021   - check blood pressure daily - choose a place to take my blood pressure (home, clinic or office, retail store) - write blood pressure results in a log or diary in [facility] calendar -call doctor for high or low readings   Why is this important?   You won't feel high blood pressure, but it can still hurt your blood vessels.  High blood pressure can cause heart or kidney problems. It can also cause a stroke.  Making lifestyle changes like losing a little weight or eating less salt will help.  Checking your blood pressure at home and at different times of the day can help to control blood pressure.  If the doctor prescribes medicine remember to take it the way the doctor ordered.  Call the office if you cannot afford the medicine or if there are questions about it.            The patient  verbalized understanding of instructions, educational materials, and care plan provided today and declined offer to receive copy of patient instructions, educational materials, and care plan.   Telephone follow up appointment with care management team member scheduled for:  04/26/2021  Rowe Pavy RN, BSN, CEN RN Case Manager - [facility]: [phone]
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
## 2021-02-25 ENCOUNTER — Ambulatory Visit: Payer: Medicare HMO

## 2021-02-25 ENCOUNTER — Other Ambulatory Visit: Payer: Self-pay

## 2021-02-25 DIAGNOSIS — I10 Essential (primary) hypertension: Secondary | ICD-10-CM

## 2021-02-25 DIAGNOSIS — F331 Major depressive disorder, recurrent, moderate: Secondary | ICD-10-CM

## 2021-02-25 DIAGNOSIS — Z8639 Personal history of other endocrine, nutritional and metabolic disease: Secondary | ICD-10-CM

## 2021-02-25 DIAGNOSIS — F4310 Post-traumatic stress disorder, unspecified: Secondary | ICD-10-CM
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
## 2021-03-02 ENCOUNTER — Other Ambulatory Visit: Payer: Self-pay

## 2021-03-02 ENCOUNTER — Ambulatory Visit: Payer: Medicare HMO | Admitting: Cardiology

## 2021-03-02 ENCOUNTER — Encounter: Payer: Self-pay | Admitting: Cardiology

## 2021-03-02 VITALS — BP 140/98 | HR 91 | Ht 63.0 in | Wt 205.6 lb

## 2021-03-02 DIAGNOSIS — I25110 Atherosclerotic heart disease of native coronary artery with unstable angina pectoris: Secondary | ICD-10-CM | POA: Diagnosis not present

## 2021-03-02 DIAGNOSIS — I10 Essential (primary) hypertension: Secondary | ICD-10-CM

## 2021-03-02 DIAGNOSIS — M5136 Other intervertebral disc degeneration, lumbar region: Secondary | ICD-10-CM | POA: Insufficient documentation

## 2021-03-02 DIAGNOSIS — Q245 Malformation of coronary vessels: Secondary | ICD-10-CM

## 2021-03-02 DIAGNOSIS — E782 Mixed hyperlipidemia: Secondary | ICD-10-CM

## 2021-03-02 DIAGNOSIS — E669 Obesity, unspecified: Secondary | ICD-10-CM | POA: Diagnosis not present

## 2021-03-02 DIAGNOSIS — M51369 Other intervertebral disc degeneration, lumbar region without mention of lumbar back pain or lower extremity pain: Secondary | ICD-10-CM

## 2021-03-02 HISTORY — DX: Other intervertebral disc degeneration, lumbar region without mention of lumbar back pain or lower extremity pain: M51.369

## 2021-03-02 HISTORY — DX: Other intervertebral disc degeneration, lumbar region: M51.36

## 2021-03-02 NOTE — Patient Instructions (Signed)

## 2021-03-02 NOTE — Progress Notes (Signed)
Cardiology Office Note:    Date:  03/02/2021   ID:  Deborah Farley, DOB [birth year], MRN [id number]  PCP:  Abigail Miyamoto, MD  Cardiologist:  Garwin Brothers, MD   Referring MD: Abigail Miyamoto,*    ASSESSMENT:    1. Coronary artery disease involving native coronary artery of native heart with unstable angina pectoris (HCC)   2. Coronary-myocardial bridge   3. Mixed hyperlipidemia   4. Primary hypertension   5. Obesity (BMI 35.0-39.9 without comorbidity)    PLAN:    In order of problems listed above:  Coronary artery disease: Secondary prevention stressed with the patient.  Importance of compliance with diet medication stressed and she vocalized understanding.  She was advised to walk half an hour a day 5 days a week she tells me that she is going to try to do this. Essential hypertension: Blood pressure stable and diet was emphasized.  Lifestyle modification urged.  Salt intake issues were discussed.  Her blood pressures are fine. Mixed dyslipidemia and diabetes mellitus: Recent lab work was reviewed and I discussed this with her at length.  Weight reduction stressed. Obesity: Weight reduction stressed.  Diet was emphasized and she promises to do better. Patient will be seen in follow-up appointment in 6 months or earlier if the patient has any concerns    Medication Adjustments/Labs and Tests Ordered: Current medicines are reviewed at length with the patient today.  Concerns regarding medicines are outlined above.  No orders of the defined types were placed in this encounter.  No orders of the defined types were placed in this encounter.    No chief complaint on file.    History of Present Illness:    Deborah Farley is a 75 y.o. female.  Patient has past medical history of coronary artery disease which is nonobstructive in nature, myocardial bridge, essential hypertension, diet-controlled diabetes mellitus and obesity.  She denies any problems at this time  and takes care of activities of daily living.  No chest pain orthopnea or PND.  She hurt herself with her dresser in the left axilla and tells me that she wants to see her primary care doctor and get x-rays in the next few days.  This happened about a month ago.  At the time of my evaluation, the patient is alert awake oriented and in no distress.  Past Medical History:  Diagnosis Date   Acid reflux 02/29/2016   Acute sinusitis 03/19/2020   Chronic idiopathic constipation 07/02/2015   Chronic pain syndrome 11/30/2020   Chronic systolic congestive heart failure (HCC) 07/02/2015   Coronary artery disease involving native coronary artery of native heart with unstable angina pectoris (HCC) 07/02/2015   Formatting of this note might be different from the original. [place]   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   Depression    Depression, major, single episode, moderate (HCC) 08/31/2020   Diabetes mellitus without complication (HCC)    Diet-controlled diabetes mellitus (HCC) 08/11/2015   Dyspnea on exertion 01/16/2019   Epigastric pain 04/08/2015   Gait disturbance 03/15/2017   Heart disease    Hernia, hiatal    Hiatal hernia 07/02/2015   High cholesterol    High risk medication use 04/12/2016   Methotrexate PLQ Eye Exam: 07/28/16 WNL @ [facility] Follow up in 6 months.    History of diabetes mellitus 05/31/2016   History of gastroesophageal reflux (GERD) 05/31/2016   History of hypothyroidism 05/31/2016   Hyperlipidemia 02/29/2016   Hypertension    Hypothyroidism  Major depressive disorder, recurrent episode, moderate (HCC) 07/02/2015   Malaise and fatigue 07/02/2015   Memory difficulty    Obesity (BMI 35.0-39.9 without comorbidity) 02/29/2016   Obesity, diabetes, and hypertension syndrome (HCC) 07/29/2020   Progressive angina (HCC) 08/11/2015   PTSD (post-traumatic stress disorder) 11/30/2020   Rheumatoid arthritis (HCC) 04/12/2016   Spastic esophagus    Suspected pulmonary  embolism 03/11/2019    Past Surgical History:  Procedure Laterality Date   ABDOMINAL HYSTERECTOMY     BACK SURGERY     CARDIAC CATHETERIZATION N/A 08/12/2015   Procedure: Left Heart Cath and Coronary Angiography;  Surgeon: Yates Decamp, MD;  Location: [facility] INVASIVE CV LAB;  Service: Cardiovascular;  Laterality: N/A;   LEFT HEART CATH AND CORONARY ANGIOGRAPHY N/A 02/20/2018   Procedure: LEFT HEART CATH AND CORONARY ANGIOGRAPHY;  Surgeon: Lennette Bihari, MD;  Location: [facility] INVASIVE CV LAB;  Service: Cardiovascular;  Laterality: N/A;   LEFT HEART CATH AND CORONARY ANGIOGRAPHY N/A 01/19/2020   Procedure: LEFT HEART CATH AND CORONARY ANGIOGRAPHY;  Surgeon: Marykay Lex, MD;  Location: [facility] INVASIVE CV LAB;  Service: Cardiovascular;  Laterality: N/A;   TOTAL ABDOMINAL HYSTERECTOMY W/ BILATERAL SALPINGOOPHORECTOMY      Current Medications: Current Meds  Medication Sig   ALPRAZolam (XANAX) 0.5 MG tablet Take 0.25 mg by mouth daily as needed for anxiety.   ARIPiprazole (ABILIFY) 2 MG tablet Take 1 tablet (2 mg total) by mouth daily.   aspirin EC 81 MG tablet Take 81 mg by mouth daily.   bumetanide (BUMEX) 1 MG tablet Take 1 mg by mouth daily as needed for edema (swelling).   buPROPion (WELLBUTRIN XL) 300 MG 24 hr tablet Take 300 mg by mouth at bedtime.    Cholecalciferol (VITAMIN D) 2000 units CAPS Take 2,000 Units by mouth daily.   diltiazem (CARDIZEM CD) 120 MG 24 hr capsule Take 1 capsule (120 mg total) by mouth daily.   folic acid (FOLVITE) 400 MCG tablet Take 400 mcg by mouth daily.   isosorbide mononitrate (IMDUR) 30 MG 24 hr tablet Take 30 mg by mouth daily.   latanoprost (XALATAN) 0.005 % ophthalmic solution Place 1 drop into both eyes at bedtime.   levothyroxine (SYNTHROID) 25 MCG tablet TAKE 1 TABLET (25 MCG TOTAL) BY MOUTH DAILY BEFORE BREAKFAST.   methotrexate (RHEUMATREX) 2.5 MG tablet Take 25 mg by mouth once a week. Caution:Chemotherapy. Protect from light.  Takes 5 tablets in the am  and 5 tablets in the pm once a week on Sundays   metoprolol tartrate (LOPRESSOR) 25 MG tablet Take 25 mg by mouth 2 (two) times daily.   Multiple Vitamin (MULTIVITAMIN) capsule Take 1 capsule by mouth daily.   nitroGLYCERIN (NITROSTAT) 0.4 MG SL tablet Place 1 tablet (0.4 mg total) under the tongue every 5 (five) minutes as needed for chest pain.   omeprazole (PRILOSEC) 40 MG capsule Take 1 capsule (40 mg total) by mouth 2 (two) times daily. (Patient taking differently: Take 80 mg by mouth 2 (two) times daily.)   polyethylene glycol (MIRALAX / GLYCOLAX) packet Take 17 g by mouth daily as needed for mild constipation.    pravastatin (PRAVACHOL) 20 MG tablet Take 20 mg by mouth daily.   sertraline (ZOLOFT) 100 MG tablet Take 50 mg by mouth daily.   traMADol (ULTRAM) 50 MG tablet Take 50 mg by mouth every 6 (six) hours as needed for pain.   traZODone (DESYREL) 50 MG tablet Take 1 tablet (50 mg total) by mouth at  bedtime.   Vitamin D, Ergocalciferol, (DRISDOL) 50000 units CAPS capsule Take 50,000 Units by mouth every Sunday.      Allergies:   Codeine, Iodine, and Shellfish allergy   Social History   Socioeconomic History   Marital status: Married    Spouse name: Bernell Haynie   Number of children: 2   Years of education: 12   Highest education level: Some college, no degree  Occupational History   Not on file  Tobacco Use   Smoking status: Never    Passive exposure: Never   Smokeless tobacco: Never  Vaping Use   Vaping Use: Never used  Substance and Sexual Activity   Alcohol use: No   Drug use: No   Sexual activity: Yes    Partners: Male  Other Topics Concern   Not on file  Social History Narrative   Not on file   Social Determinants of Health   Financial Resource Strain: High Risk   Difficulty of Paying Living Expenses: Very hard  Food Insecurity: No Food Insecurity   Worried About Programme researcher, broadcasting/film/video in the Last Year: Never true   Ran Out of Food in the Last Year: Never  true  Transportation Needs: No Transportation Needs   Lack of Transportation (Medical): No   Lack of Transportation (Non-Medical): No  Physical Activity: Inactive   Days of Exercise per Week: 0 days   Minutes of Exercise per Session: 0 min  Stress: Stress Concern Present   Feeling of Stress : To some extent  Social Connections: Moderately Integrated   Frequency of Communication with Friends and Family: More than three times a week   Frequency of Social Gatherings with Friends and Family: More than three times a week   Attends Religious Services: 1 to 4 times per year   Active Member of [employer] or Organizations: No   Attends Banker Meetings: Never   Marital Status: Married     Family History: The patient's family history includes Heart disease in her father and mother.  ROS:   Please see the history of present illness.    All other systems reviewed and are negative.  EKGs/Labs/Other Studies Reviewed:    The following studies were reviewed today: EKG reveals sinus rhythm and nonspecific ST-T changes   Recent Labs: 11/30/2020: ALT 18; BUN 10; Creatinine, Ser 0.93; Hemoglobin 16.4; Platelets 214; Potassium 4.4; Sodium 141; TSH 2.890  Recent Lipid Panel    Component Value Date/Time   CHOL 170 11/30/2020 1455   TRIG 101 11/30/2020 1455   HDL 56 11/30/2020 1455   CHOLHDL 3.0 11/30/2020 1455   LDLCALC 96 11/30/2020 1455    Physical Exam:    VS:  BP (!) 140/98   Pulse 91   Ht 5\' 3"  (1.6 m)   Wt 205 lb 9.6 oz (93.3 kg)   SpO2 99%   BMI 36.42 kg/m     Wt Readings from Last 3 Encounters:  03/02/21 205 lb 9.6 oz (93.3 kg)  12/30/20 204 lb 9.6 oz (92.8 kg)  11/30/20 200 lb (90.7 kg)     GEN: Patient is in no acute distress HEENT: Normal NECK: No JVD; No carotid bruits LYMPHATICS: No lymphadenopathy CARDIAC: Hear sounds regular, 2/6 systolic murmur at the apex. RESPIRATORY:  Clear to auscultation without rales, wheezing or rhonchi  ABDOMEN: Soft,  non-tender, non-distended MUSCULOSKELETAL:  No edema; No deformity  SKIN: Warm and dry NEUROLOGIC:  Alert and oriented x 3 PSYCHIATRIC:  Normal affect   Signed,  Garwin Brothers, MD  03/02/2021 1:54 PM    [facility]

## 2021-03-07 DIAGNOSIS — F32A Depression, unspecified: Secondary | ICD-10-CM

## 2021-03-07 DIAGNOSIS — F321 Major depressive disorder, single episode, moderate: Secondary | ICD-10-CM

## 2021-03-07 DIAGNOSIS — F331 Major depressive disorder, recurrent, moderate: Secondary | ICD-10-CM

## 2021-03-07 DIAGNOSIS — I10 Essential (primary) hypertension: Secondary | ICD-10-CM | POA: Diagnosis not present

## 2021-03-08 ENCOUNTER — Ambulatory Visit (INDEPENDENT_AMBULATORY_CARE_PROVIDER_SITE_OTHER): Payer: Medicare HMO | Admitting: *Deleted

## 2021-03-08 DIAGNOSIS — R269 Unspecified abnormalities of gait and mobility: Secondary | ICD-10-CM

## 2021-03-08 DIAGNOSIS — F331 Major depressive disorder, recurrent, moderate: Secondary | ICD-10-CM

## 2021-03-08 DIAGNOSIS — F321 Major depressive disorder, single episode, moderate: Secondary | ICD-10-CM

## 2021-03-08 DIAGNOSIS — I10 Essential (primary) hypertension: Secondary | ICD-10-CM

## 2021-03-08 DIAGNOSIS — F32A Depression, unspecified: Secondary | ICD-10-CM

## 2021-03-08 DIAGNOSIS — R413 Other amnesia: Secondary | ICD-10-CM

## 2021-03-08 DIAGNOSIS — F4310 Post-traumatic stress disorder, unspecified: Secondary | ICD-10-CM
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
## 2021-03-08 NOTE — Patient Instructions (Signed)
Visit Information  Patient verbalizes understanding of instructions provided today and agrees to view in MyChart.   Telephone follow up appointment with care management team member scheduled for:  03/23/2021 at 11:00am  Danford Bad LCSW Licensed Clinical Social Worker [facility] [phone]

## 2021-03-10 ENCOUNTER — Other Ambulatory Visit: Payer: Self-pay

## 2021-03-10 ENCOUNTER — Ambulatory Visit (INDEPENDENT_AMBULATORY_CARE_PROVIDER_SITE_OTHER): Payer: Medicare HMO | Admitting: Legal Medicine

## 2021-03-10 ENCOUNTER — Encounter: Payer: Self-pay | Admitting: Legal Medicine

## 2021-03-10 VITALS — BP 142/78 | HR 63 | Temp 97.3°F | Resp 16 | Ht 63.0 in | Wt 206.0 lb

## 2021-03-10 DIAGNOSIS — S22000A Wedge compression fracture of unspecified thoracic vertebra, initial encounter for closed fracture: Secondary | ICD-10-CM

## 2021-03-10 DIAGNOSIS — R0789 Other chest pain: Secondary | ICD-10-CM | POA: Diagnosis not present

## 2021-03-10 DIAGNOSIS — R0781 Pleurodynia: Secondary | ICD-10-CM

## 2021-03-10 DIAGNOSIS — N644 Mastodynia: Secondary | ICD-10-CM

## 2021-03-10 MED ORDER — HYDROCODONE-ACETAMINOPHEN 10-325 MG PO TABS: 1.0000 | ORAL_TABLET | Freq: Three times a day (TID) | ORAL | 0 refills | Status: AC | PRN
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
## 2021-03-11 DIAGNOSIS — M545 Low back pain, unspecified: Secondary | ICD-10-CM | POA: Diagnosis not present

## 2021-03-11 DIAGNOSIS — K449 Diaphragmatic hernia without obstruction or gangrene: Secondary | ICD-10-CM | POA: Diagnosis not present

## 2021-03-11 DIAGNOSIS — M47814 Spondylosis without myelopathy or radiculopathy, thoracic region: Secondary | ICD-10-CM | POA: Diagnosis not present

## 2021-03-11 DIAGNOSIS — S2232XA Fracture of one rib, left side, initial encounter for closed fracture: Secondary | ICD-10-CM | POA: Diagnosis not present

## 2021-03-11 DIAGNOSIS — M2578 Osteophyte, vertebrae: Secondary | ICD-10-CM | POA: Diagnosis not present

## 2021-03-15 ENCOUNTER — Encounter: Payer: Self-pay | Admitting: Legal Medicine

## 2021-03-15 ENCOUNTER — Other Ambulatory Visit: Payer: Self-pay

## 2021-03-15 DIAGNOSIS — Z1231 Encounter for screening mammogram for malignant neoplasm of breast: Secondary | ICD-10-CM

## 2021-03-16 ENCOUNTER — Encounter: Payer: Self-pay | Admitting: Gastroenterology

## 2021-03-18 ENCOUNTER — Other Ambulatory Visit: Payer: Self-pay | Admitting: Legal Medicine

## 2021-03-18 DIAGNOSIS — Z1231 Encounter for screening mammogram for malignant neoplasm of breast: Secondary | ICD-10-CM

## 2021-03-23 ENCOUNTER — Ambulatory Visit: Payer: Self-pay

## 2021-03-23 ENCOUNTER — Ambulatory Visit: Payer: Medicare HMO | Admitting: *Deleted

## 2021-03-23 DIAGNOSIS — I200 Unstable angina: Secondary | ICD-10-CM

## 2021-03-23 DIAGNOSIS — E7800 Pure hypercholesterolemia, unspecified: Secondary | ICD-10-CM

## 2021-03-23 DIAGNOSIS — I10 Essential (primary) hypertension: Secondary | ICD-10-CM

## 2021-03-23 DIAGNOSIS — Z8639 Personal history of other endocrine, nutritional and metabolic disease: Secondary | ICD-10-CM

## 2021-03-23 DIAGNOSIS — F321 Major depressive disorder, single episode, moderate: Secondary | ICD-10-CM

## 2021-03-23 DIAGNOSIS — R413 Other amnesia: Secondary | ICD-10-CM

## 2021-03-23 DIAGNOSIS — K5904 Chronic idiopathic constipation: Secondary | ICD-10-CM

## 2021-03-23 DIAGNOSIS — E1159 Type 2 diabetes mellitus with other circulatory complications: Secondary | ICD-10-CM

## 2021-03-23 DIAGNOSIS — F4310 Post-traumatic stress disorder, unspecified: Secondary | ICD-10-CM

## 2021-03-23 DIAGNOSIS — R269 Unspecified abnormalities of gait and mobility: Secondary | ICD-10-CM

## 2021-03-23 DIAGNOSIS — F331 Major depressive disorder, recurrent, moderate: Secondary | ICD-10-CM

## 2021-03-23 DIAGNOSIS — Q245 Malformation of coronary vessels: Secondary | ICD-10-CM

## 2021-03-23 DIAGNOSIS — E669 Obesity, unspecified: Secondary | ICD-10-CM

## 2021-03-23 DIAGNOSIS — S22000A Wedge compression fracture of unspecified thoracic vertebra, initial encounter for closed fracture: Secondary | ICD-10-CM

## 2021-03-23 NOTE — Addendum Note (Signed)
Addended by: Danford Bad D on: 03/23/2021 03:00 PM   Modules accepted: Orders

## 2021-03-23 NOTE — Chronic Care Management (AMB) (Signed)
   03/23/2021  Deborah Farley Nov 05, 1945 [id number]   Message received from LCSW that patient no longer interested in services. Care plans completed.  Message forward to careguide.  Rowe Pavy RN, BSN, CEN RN Case Production designer, theatre/television/film - [facility] Mobile: [phone]

## 2021-03-23 NOTE — Patient Instructions (Signed)
Visit Information  Long-Range Goal: Improve My Quality of Life.   Start Date: 12/29/2020  Expected End Date: 05/31/2021  This Visit's Progress: On track  Recent Progress: On track  Priority: High  Note:   Current Barriers:   Acute Mental Health needs related to Anxiety, Depression, Post-Traumatic Stress Disorder, Chronic Pain, Sheltered Homelessness and Marital Discord needs Support, Education, Resources, Referrals and Care Coordination in order to meet unmet mental health and housing needs. Clinical Goal(s):  Patient will work with LCSW to reduce and manage symptoms of Anxiety, Depression, Post-Traumatic Stress Disorder, Chronic Pain, Sheltered Homelessness and Marital Discord. Patient will increase knowledge and/or ability of: Coping Skills, Healthy Habits, Self-Management Skills, Stress Reduction, Home Safety and Utilization of [employer] and Resources. Clinical Interventions: Problem Solving Solutions developed, Active Listening utilized, Emotional Support provided, Verbalization of Feelings encouraged and Cognitive Behavioral Therapy performed. Collaboration with Primary Care Physician, Dr. Brent Bulla regarding development and update of comprehensive plan of care as evidenced by provider attestation and co-signature. Collaboration with Primary Care Physician, Dr. Brent Bulla to report patient's concerns and requests:  1. Persistent pain in chest due to broken ribs.  Unable to take pain medication prescribed due to allergy to codeine.  Did not get prescription filled at  pharmacy. 2. Shortness of breath while talking and inability to catch breath, due to severity of pain in chest.   3. Unable to understand office medical assistant, leaving office feeling frustrated and unknowledgeable.   Collaboration with Embedded Nurse Case Manager, Rowe Pavy to report patient's request to terminate services. Collaboration with Embedded Pharmacist, Artelia Laroche to report patient's  request to terminate services. Collaboration with Community Care Guides to place referral for housing resources. Inter-disciplinary care team collaboration (see longitudinal plan of care). Patient Goals/Self-Care Activities: Continue to receive personal counseling with LCSW, on a bi-weekly basis, to reduce and manage symptoms of Anxiety, Depression, Post-Traumatic Stress Disorder and Marital Discord, no longer interested in receiving ongoing counseling and supportive services through referral to [facility]. Termination of embedded nurse case manager services, as well as embedded pharmacy services, both through [employer].  Please accept all calls from [facility] Guides, in an effort to obtain housing resources. Contact LCSW directly (# [phone]) if you have questions, need assistance, or if additional social work needs are identified between now and our next scheduled telephone outreach call. Follow-Up Date:  04/06/2021 at 9:30am      Patient verbalizes understanding of instructions provided today and agrees to view in MyChart.   Telephone follow up appointment with care management team member scheduled for:  04/06/2021 at 9:30am  Danford Bad LCSW Licensed Clinical Social Worker [facility] [phone]

## 2021-03-23 NOTE — Chronic Care Management (AMB) (Signed)
Chronic Care Management    Clinical Social Work Note  03/23/2021 Name: Deborah Farley MRN: [id number] DOB: 1945-07-21  Deborah Farley is a 75 y.o. year old female who is a primary care patient of Henrene Pastor Zeb Comfort, MD. The CCM team was consulted to assist the patient with chronic disease management and/or care coordination needs related to: Appointment Scheduling Needs, Community Resources, Mental Health Counseling and Resources, and Caregiver Stress.   Engaged with patient by telephone for follow-up visit in response to provider referral for social work chronic care management and care coordination services.   Consent to Services:  The patient was given the following information about Chronic Care Management services today, agreed to services, and gave verbal consent: 1. CCM service includes personalized support from designated clinical staff supervised by the primary care provider, including individualized plan of care and coordination with other care providers 2. 24/7 contact phone numbers for assistance for urgent and routine care needs. 3. Service will only be billed when office clinical staff spend 20 minutes or more in a month to coordinate care. 4. Only one practitioner may furnish and bill the service in a calendar month. 5.The patient may stop CCM services at any time (effective at the end of the month) by phone call to the office staff. 6. The patient will be responsible for cost sharing (co-pay) of up to 20% of the service fee (after annual deductible is met). Patient agreed to services and consent obtained.  Patient agreed to services and consent obtained.   Assessment: Review of patient past medical history, allergies, medications, and health status, including review of relevant consultants reports was performed today as part of a comprehensive evaluation and provision of chronic care management and care coordination services.     SDOH (Social Determinants of Health) assessments  and interventions performed:    Advanced Directives Status: Not addressed in this encounter.  CCM Care Plan  Allergies  Allergen Reactions   Codeine Anaphylaxis   Iodine Anaphylaxis   Shellfish Allergy     Anaphylactic     Outpatient Encounter Medications as of 03/23/2021  Medication Sig   ALPRAZolam (XANAX) 0.5 MG tablet Take 0.25 mg by mouth daily as needed for anxiety.   ARIPiprazole (ABILIFY) 2 MG tablet Take 1 tablet (2 mg total) by mouth daily.   aspirin EC 81 MG tablet Take 81 mg by mouth daily.   bumetanide (BUMEX) 1 MG tablet Take 1 mg by mouth daily as needed for edema (swelling).   buPROPion (WELLBUTRIN XL) 300 MG 24 hr tablet Take 300 mg by mouth at bedtime.    Cholecalciferol (VITAMIN D) 2000 units CAPS Take 2,000 Units by mouth daily.   diltiazem (CARDIZEM CD) 120 MG 24 hr capsule Take 1 capsule (120 mg total) by mouth daily.   folic acid (FOLVITE) 038 MCG tablet Take 400 mcg by mouth daily.   isosorbide mononitrate (IMDUR) 30 MG 24 hr tablet Take 30 mg by mouth daily.   latanoprost (XALATAN) 0.005 % ophthalmic solution Place 1 drop into both eyes at bedtime.   levothyroxine (SYNTHROID) 25 MCG tablet TAKE 1 TABLET (25 MCG TOTAL) BY MOUTH DAILY BEFORE BREAKFAST.   methotrexate (RHEUMATREX) 2.5 MG tablet Take 25 mg by mouth once a week. Caution:Chemotherapy. Protect from light.  Takes 5 tablets in the am and 5 tablets in the pm once a week on Sundays   metoprolol tartrate (LOPRESSOR) 25 MG tablet Take 25 mg by mouth 2 (two) times daily.   Multiple Vitamin (MULTIVITAMIN)  capsule Take 1 capsule by mouth daily.   nitroGLYCERIN (NITROSTAT) 0.4 MG SL tablet Place 1 tablet (0.4 mg total) under the tongue every 5 (five) minutes as needed for chest pain.   omeprazole (PRILOSEC) 40 MG capsule Take 1 capsule (40 mg total) by mouth 2 (two) times daily. (Patient taking differently: Take 80 mg by mouth 2 (two) times daily.)   polyethylene glycol (MIRALAX / GLYCOLAX) packet Take 17 g  by mouth daily as needed for mild constipation.    pravastatin (PRAVACHOL) 20 MG tablet Take 20 mg by mouth daily.   sertraline (ZOLOFT) 100 MG tablet Take 50 mg by mouth daily.   traMADol (ULTRAM) 50 MG tablet Take 50 mg by mouth every 6 (six) hours as needed for pain.   traZODone (DESYREL) 50 MG tablet Take 1 tablet (50 mg total) by mouth at bedtime.   Vitamin D, Ergocalciferol, (DRISDOL) 50000 units CAPS capsule Take 50,000 Units by mouth every Sunday.    No facility-administered encounter medications on file as of 03/23/2021.    Patient Active Problem List   Diagnosis Date Noted   Degeneration of lumbar intervertebral disc 03/02/2021   Depression 02/17/2021   Chronic pain syndrome 11/30/2020   PTSD (post-traumatic stress disorder) 11/30/2020   Depression, major, single episode, moderate ([place]) 08/31/2020   Obesity, diabetes, and hypertension syndrome ([place]) 07/29/2020   Diabetes mellitus without complication ([place])    Hernia, hiatal    High cholesterol    Memory difficulty    Spastic esophagus    Acute sinusitis 03/19/2020   Suspected pulmonary embolism 03/11/2019   Dyspnea on exertion 01/16/2019   Gait disturbance 03/15/2017   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   History of diabetes mellitus 05/31/2016   History of hypothyroidism 05/31/2016   History of gastroesophageal reflux (GERD) 05/31/2016   Rheumatoid arthritis ([place]) 04/12/2016   High risk medication use 04/12/2016   Heart disease 03/01/2016   Hyperlipidemia 02/29/2016   Hypertension 02/29/2016   Hypothyroidism 02/29/2016   Obesity (BMI 35.0-39.9 without comorbidity) 02/29/2016   Acid reflux 02/29/2016   Progressive angina ([place]) 08/11/2015   Diet-controlled diabetes mellitus ([place]) 08/11/2015   Chronic idiopathic constipation 37/34/2876   Chronic systolic congestive heart failure ([place]) 07/02/2015   Hiatal hernia 07/02/2015   Malaise and fatigue 07/02/2015   Major depressive disorder, recurrent episode, moderate  ([place]) 07/02/2015   Coronary artery disease involving native coronary artery of native heart with unstable angina pectoris ([place]) 07/02/2015   Epigastric pain 04/08/2015    Conditions to be addressed/monitored: Anxiety, Depression, and Post-Traumatic Stress Disorder.  Film/video editor, Limited Social Support, Housing Barriers, Mental Health Concerns, Family and Relationship Dysfunction, Marital Discord, Social Isolation, Limited Access to Building control surveyor, and Teacher, English as a foreign language of [employer].  Care Plan : LCSW Plan of Care  Updates made by Francis Gaines, LCSW since 03/23/2021 12:00 AM     Problem: Improve My Quality of Life.   Priority: High     Long-Range Goal: Improve My Quality of Life.   Start Date: 12/29/2020  Expected End Date: 05/31/2021  This Visit's Progress: On track  Recent Progress: On track  Priority: High  Note:   Current Barriers:   Acute Mental Health needs related to Anxiety, Depression, Post-Traumatic Stress Disorder, Chronic Pain, Sheltered Homelessness and Marital Discord needs Support, Education, Resources, Referrals and Care Coordination in order to meet unmet mental health and housing needs. Clinical Goal(s):  Patient will work with LCSW to reduce and manage symptoms of Anxiety, Depression, Post-Traumatic Stress Disorder, Chronic Pain,  Sheltered Homelessness and Marital Discord. Patient will increase knowledge and/or ability of: Coping Skills, Healthy Habits, Self-Management Skills, Stress Reduction, Home Safety and Utilization of [employer] and Resources. Clinical Interventions: Problem Solving Solutions developed, Active Listening utilized, Emotional Support provided, Verbalization of Feelings encouraged and Cognitive Behavioral Therapy performed. Collaboration with Primary Care Physician, Dr. Reinaldo Meeker regarding development and update of comprehensive plan of care as evidenced by provider attestation and co-signature. Collaboration with  Primary Care Physician, Dr. Reinaldo Meeker to report patient's concerns and requests:  1. Persistent pain in chest due to broken ribs.  Unable to take pain medication prescribed due to allergy to codeine.  Did not get prescription filled at  pharmacy. 2. Shortness of breath while talking and inability to catch breath, due to severity of pain in chest.   3. Unable to understand office medical assistant, leaving office feeling frustrated and unknowledgeable.   Collaboration with Embedded Nurse Case Manager, Tomasa Rand to report patient's request to terminate services. Collaboration with Embedded Pharmacist, Arizona Constable to report patient's request to terminate services. Collaboration with Community Care Guides to place referral for housing resources. Inter-disciplinary care team collaboration (see longitudinal plan of care). Patient Goals/Self-Care Activities: Continue to receive personal counseling with LCSW, on a bi-weekly basis, to reduce and manage symptoms of Anxiety, Depression, Post-Traumatic Stress Disorder and Marital Discord, no longer interested in receiving ongoing counseling and supportive services through referral to [facility]. Termination of embedded nurse case manager services, as well as embedded pharmacy services, both through [employer].  Please accept all calls from [place], in an effort to obtain housing resources. Contact LCSW directly (# [phone]) if you have questions, need assistance, or if additional social work needs are identified between now and our next scheduled telephone outreach call. Follow-Up Date:  04/06/2021 at 9:30am    Nat Christen [place] Clinical Social Worker [place] [phone]

## 2021-03-24 ENCOUNTER — Telehealth: Payer: Self-pay | Admitting: *Deleted

## 2021-03-24 ENCOUNTER — Other Ambulatory Visit: Payer: Self-pay

## 2021-03-24 NOTE — Telephone Encounter (Signed)
Deborah Farley was called to follow-up on her ribcage pain.  She has been taking ibuprofen in addition to her methotrexate which she has been instructed not to do in the past.  She continues to have ribcage pain which is worse when taking a deep breath.  She has taken tramadol in the past and would like a new prescription for this please.

## 2021-03-24 NOTE — Telephone Encounter (Signed)
   Telephone encounter was:  Successful.  03/24/2021 Name: Immaculate Crutcher MRN: [id number] DOB: 06/05/1945  Deborah Farley is a 76 y.o. year old female who is a primary care patient of Abigail Miyamoto, MD . The community resource team was consulted for assistance with Patient asked for housing information in [place] will email to her , she was provided information about Horticulturist, commercial and senior resources as well   Care guide performed the following interventions: Patient provided with information about care guide support team and interviewed to confirm resource needs Follow up call placed to community resources to determine status of patients referral.  Follow Up Plan:  No further follow up planned at this time. The patient has been provided with needed resources. Alois Cliche -[facility] Guide , Embedded Care Coordination [facility], Care Management  [phone] [address] [place] , [place] [address] Email : [email or web address]

## 2021-03-25 MED ORDER — TRAMADOL HCL 50 MG PO TABS: 50.0000 mg | ORAL_TABLET | Freq: Four times a day (QID) | ORAL | 0 refills | Status: DC | PRN

## 2021-04-04 ENCOUNTER — Ambulatory Visit
Admission: RE | Admit: 2021-04-04 | Discharge: 2021-04-04 | Disposition: A | Discharge status: Home / Self Care | Payer: Medicare HMO | Source: Ambulatory Visit | Attending: Legal Medicine | Admitting: Legal Medicine

## 2021-04-04 ENCOUNTER — Other Ambulatory Visit: Payer: Self-pay

## 2021-04-04 DIAGNOSIS — Z1231 Encounter for screening mammogram for malignant neoplasm of breast: Secondary | ICD-10-CM

## 2021-04-05 ENCOUNTER — Encounter: Payer: Self-pay | Admitting: Legal Medicine

## 2021-04-05 ENCOUNTER — Ambulatory Visit (INDEPENDENT_AMBULATORY_CARE_PROVIDER_SITE_OTHER): Payer: Medicare HMO | Admitting: Legal Medicine

## 2021-04-05 VITALS — BP 142/66 | HR 75 | Temp 98.0°F | Resp 18 | Ht 63.0 in | Wt 206.2 lb

## 2021-04-05 DIAGNOSIS — E782 Mixed hyperlipidemia: Secondary | ICD-10-CM

## 2021-04-05 DIAGNOSIS — I10 Essential (primary) hypertension: Secondary | ICD-10-CM

## 2021-04-05 DIAGNOSIS — E1169 Type 2 diabetes mellitus with other specified complication: Secondary | ICD-10-CM

## 2021-04-05 DIAGNOSIS — F331 Major depressive disorder, recurrent, moderate: Secondary | ICD-10-CM | POA: Diagnosis not present

## 2021-04-05 DIAGNOSIS — M0609 Rheumatoid arthritis without rheumatoid factor, multiple sites: Secondary | ICD-10-CM

## 2021-04-05 DIAGNOSIS — E039 Hypothyroidism, unspecified: Secondary | ICD-10-CM

## 2021-04-05 DIAGNOSIS — E669 Obesity, unspecified: Secondary | ICD-10-CM | POA: Diagnosis not present

## 2021-04-05 DIAGNOSIS — I5022 Chronic systolic (congestive) heart failure: Secondary | ICD-10-CM | POA: Diagnosis not present

## 2021-04-05 DIAGNOSIS — E1159 Type 2 diabetes mellitus with other circulatory complications: Secondary | ICD-10-CM | POA: Diagnosis not present

## 2021-04-05 DIAGNOSIS — I152 Hypertension secondary to endocrine disorders: Secondary | ICD-10-CM | POA: Diagnosis not present
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
## 2021-04-06 ENCOUNTER — Ambulatory Visit: Payer: Medicare HMO | Admitting: *Deleted

## 2021-04-06 DIAGNOSIS — R269 Unspecified abnormalities of gait and mobility: Secondary | ICD-10-CM

## 2021-04-06 DIAGNOSIS — E782 Mixed hyperlipidemia: Secondary | ICD-10-CM

## 2021-04-06 DIAGNOSIS — I25110 Atherosclerotic heart disease of native coronary artery with unstable angina pectoris: Secondary | ICD-10-CM

## 2021-04-06 DIAGNOSIS — F321 Major depressive disorder, single episode, moderate: Secondary | ICD-10-CM

## 2021-04-06 DIAGNOSIS — Z8639 Personal history of other endocrine, nutritional and metabolic disease: Secondary | ICD-10-CM

## 2021-04-06 DIAGNOSIS — E7800 Pure hypercholesterolemia, unspecified: Secondary | ICD-10-CM

## 2021-04-06 DIAGNOSIS — E1159 Type 2 diabetes mellitus with other circulatory complications: Secondary | ICD-10-CM

## 2021-04-06 DIAGNOSIS — F331 Major depressive disorder, recurrent, moderate: Secondary | ICD-10-CM

## 2021-04-06 DIAGNOSIS — I200 Unstable angina: Secondary | ICD-10-CM

## 2021-04-06 DIAGNOSIS — I152 Hypertension secondary to endocrine disorders: Secondary | ICD-10-CM

## 2021-04-06 DIAGNOSIS — E669 Obesity, unspecified: Secondary | ICD-10-CM

## 2021-04-06 DIAGNOSIS — E1169 Type 2 diabetes mellitus with other specified complication: Secondary | ICD-10-CM

## 2021-04-06 DIAGNOSIS — I10 Essential (primary) hypertension: Secondary | ICD-10-CM

## 2021-04-06 LAB — CBC WITH DIFFERENTIAL/PLATELET
Basophils Absolute: 0.1 10*3/uL (ref 0.0–0.2)
Basos: 1 %
EOS (ABSOLUTE): 0.2 10*3/uL (ref 0.0–0.4)
Eos: 4 %
Hematocrit: 44.8 % (ref 34.0–46.6)
Hemoglobin: 15.1 g/dL (ref 11.1–15.9)
Immature Grans (Abs): 0 10*3/uL (ref 0.0–0.1)
Immature Granulocytes: 0 %
Lymphocytes Absolute: 2.2 10*3/uL (ref 0.7–3.1)
Lymphs: 38 %
MCH: 32.2 pg (ref 26.6–33.0)
MCHC: 33.7 g/dL (ref 31.5–35.7)
MCV: 96 fL (ref 79–97)
Monocytes Absolute: 0.5 10*3/uL (ref 0.1–0.9)
Monocytes: 8 %
Neutrophils Absolute: 2.9 10*3/uL (ref 1.4–7.0)
Neutrophils: 49 %
Platelets: 188 10*3/uL (ref 150–450)
RBC: 4.69 x10E6/uL (ref 3.77–5.28)
RDW: 13.1 % (ref 11.7–15.4)
WBC: 5.8 10*3/uL (ref 3.4–10.8)

## 2021-04-06 LAB — COMPREHENSIVE METABOLIC PANEL
ALT: 24 IU/L (ref 0–32)
AST: 43 IU/L — ABNORMAL HIGH (ref 0–40)
Albumin/Globulin Ratio: 1.3 (ref 1.2–2.2)
Albumin: 3.8 g/dL (ref 3.7–4.7)
Alkaline Phosphatase: 128 IU/L — ABNORMAL HIGH (ref 44–121)
BUN/Creatinine Ratio: 9 — ABNORMAL LOW (ref 12–28)
BUN: 8 mg/dL (ref 8–27)
Bilirubin Total: 0.5 mg/dL (ref 0.0–1.2)
CO2: 25 mmol/L (ref 20–29)
Calcium: 9.4 mg/dL (ref 8.7–10.3)
Chloride: 105 mmol/L (ref 96–106)
Creatinine, Ser: 0.92 mg/dL (ref 0.57–1.00)
Globulin, Total: 3 g/dL (ref 1.5–4.5)
Glucose: 93 mg/dL (ref 70–99)
Potassium: 4.7 mmol/L (ref 3.5–5.2)
Sodium: 141 mmol/L (ref 134–144)
Total Protein: 6.8 g/dL (ref 6.0–8.5)
eGFR: 65 mL/min/{1.73_m2} (ref >59)

## 2021-04-06 LAB — HEMOGLOBIN A1C
Est. average glucose Bld gHb Est-mCnc: 111 mg/dL
Hgb A1c MFr Bld: 5.5 % (ref 4.8–5.6)

## 2021-04-06 LAB — LIPID PANEL
Chol/HDL Ratio: 3.2 ratio (ref 0.0–4.4)
Cholesterol, Total: 153 mg/dL (ref 100–199)
HDL: 48 mg/dL (ref >39)
LDL Chol Calc (NIH): 85 mg/dL (ref 0–99)
Triglycerides: 108 mg/dL (ref 0–149)
VLDL Cholesterol Cal: 20 mg/dL (ref 5–40)

## 2021-04-06 LAB — TSH: TSH: 3.11 u[IU]/mL (ref 0.450–4.500)

## 2021-04-06 LAB — CARDIOVASCULAR RISK ASSESSMENT

## 2021-04-06 NOTE — Progress Notes (Signed)
TSH 3.11 normal, cbc normal, kidney tests normal, one liver test increased, A1c 5.5, cholesterol normal,  lp

## 2021-04-06 NOTE — Patient Instructions (Addendum)
Visit Information  Thank you for taking time to visit with me today. Please don't hesitate to contact me if I can be of assistance to you before our next scheduled telephone appointment.  Following are the goals we discussed today:   Patient Goals/Self-Care Activities: Continue to receive personal counseling with LCSW, on a bi-weekly basis, to reduce and manage symptoms of Anxiety, Depression, Post-Traumatic Stress Disorder and Marital Discord, until well-managed. LCSW collaboration with Primary Care Physician, Dr. Brent Bulla to obtain clarification on current antidepressant medication regimen, as well as to report your willingness to try a new combination of antidepressant medications. LCSW collaboration with Primary Care Physician, Dr. Brent Bulla to inquire about need for follow-up appointment. Continue to make travel arrangements to visit son in [place], from 05/06/2021 thru 05/14/2021. Please accept all calls from [facility], in an effort to obtain housing resources. Contact LCSW directly (# [phone]) if you have questions, need assistance, or if additional social work needs are identified between now and our next scheduled telephone outreach call.  Our next appointment is by telephone on 04/20/2021 at 9:45 am.  Please call the care guide team at [phone] if you need to cancel or reschedule your appointment.   If you are experiencing a Mental Health or Behavioral Health Crisis or need someone to talk to, please call the Suicide and Crisis Lifeline: 988 call the [place] National Suicide Prevention Lifeline: 352-834-8094 or TTY: 925-397-7744 TTY 610 689 4083) to talk to a trained counselor call 1-800-273-TALK (toll free, 24 hour hotline) go to [facility] [address] [place] [phone]) call the [facility] Crisis Line: [phone] call 911   Patient verbalizes understanding of instructions provided today and  agrees to view in MyChart.   Danford Bad LCSW Licensed Clinical Social Worker [facility] [phone]

## 2021-04-06 NOTE — Chronic Care Management (AMB) (Signed)
Chronic Care Management    Clinical Social Work Note  04/06/2021 Name: Deborah Farley MRN: [id number] DOB: January 16, 1946  Deborah Farley is a 75 y.o. year old female who is a primary care patient of Marina Goodell Mickle Mallory, MD. The CCM team was consulted to assist the patient with chronic disease management and/or care coordination needs related to: Appointment Scheduling Needs, Community Resources, Mental Health Counseling and Resources, and Grief Counseling.   Engaged with patient by telephone for follow up visit in response to provider referral for social work chronic care management and care coordination services.   Consent to Services:  The patient was given information about Chronic Care Management services, agreed to services, and gave verbal consent prior to initiation of services.  Please see initial visit note for detailed documentation.   Patient agreed to services and consent obtained.   Assessment: Review of patient past medical history, allergies, medications, and health status, including review of relevant consultants reports was performed today as part of a comprehensive evaluation and provision of chronic care management and care coordination services.     SDOH (Social Determinants of Health) assessments and interventions performed:    Advanced Directives Status: Not addressed in this encounter.  CCM Care Plan  Allergies  Allergen Reactions   Codeine Anaphylaxis   Iodine Anaphylaxis   Shellfish Allergy     Anaphylactic     Outpatient Encounter Medications as of 04/06/2021  Medication Sig   ALPRAZolam (XANAX) 0.5 MG tablet Take 0.25 mg by mouth daily as needed for anxiety.   ARIPiprazole (ABILIFY) 2 MG tablet Take 1 tablet (2 mg total) by mouth daily.   aspirin EC 81 MG tablet Take 81 mg by mouth daily.   bumetanide (BUMEX) 1 MG tablet Take 1 mg by mouth daily as needed for edema (swelling).   buPROPion (WELLBUTRIN XL) 300 MG 24 hr tablet Take 300 mg by mouth at  bedtime.    Cholecalciferol (VITAMIN D) 2000 units CAPS Take 2,000 Units by mouth daily.   diltiazem (CARDIZEM CD) 120 MG 24 hr capsule Take 1 capsule (120 mg total) by mouth daily.   folic acid (FOLVITE) 400 MCG tablet Take 400 mcg by mouth daily.   isosorbide mononitrate (IMDUR) 30 MG 24 hr tablet Take 30 mg by mouth daily.   latanoprost (XALATAN) 0.005 % ophthalmic solution Place 1 drop into both eyes at bedtime.   levothyroxine (SYNTHROID) 25 MCG tablet TAKE 1 TABLET (25 MCG TOTAL) BY MOUTH DAILY BEFORE BREAKFAST.   methotrexate (RHEUMATREX) 2.5 MG tablet Take 25 mg by mouth once a week. Caution:Chemotherapy. Protect from light.  Takes 5 tablets in the am and 5 tablets in the pm once a week on Sundays   metoprolol tartrate (LOPRESSOR) 25 MG tablet Take 25 mg by mouth 2 (two) times daily.   Multiple Vitamin (MULTIVITAMIN) capsule Take 1 capsule by mouth daily.   nitroGLYCERIN (NITROSTAT) 0.4 MG SL tablet Place 1 tablet (0.4 mg total) under the tongue every 5 (five) minutes as needed for chest pain.   omeprazole (PRILOSEC) 40 MG capsule Take 1 capsule (40 mg total) by mouth 2 (two) times daily. (Patient taking differently: Take 80 mg by mouth 2 (two) times daily.)   polyethylene glycol (MIRALAX / GLYCOLAX) packet Take 17 g by mouth daily as needed for mild constipation.    pravastatin (PRAVACHOL) 20 MG tablet Take 20 mg by mouth daily.   sertraline (ZOLOFT) 100 MG tablet Take 50 mg by mouth daily.   traMADol (ULTRAM) 50  MG tablet Take 1 tablet (50 mg total) by mouth every 6 (six) hours as needed.   traZODone (DESYREL) 50 MG tablet Take 1 tablet (50 mg total) by mouth at bedtime.   Vitamin D, Ergocalciferol, (DRISDOL) 50000 units CAPS capsule Take 50,000 Units by mouth every Sunday.    No facility-administered encounter medications on file as of 04/06/2021.    Patient Active Problem List   Diagnosis Date Noted   Degeneration of lumbar intervertebral disc 03/02/2021   Depression 02/17/2021    Chronic pain syndrome 11/30/2020   PTSD (post-traumatic stress disorder) 11/30/2020   Depression, major, single episode, moderate (HCC) 08/31/2020   Obesity, diabetes, and hypertension syndrome (HCC) 07/29/2020   Hernia, hiatal    High cholesterol    Memory difficulty    Spastic esophagus    Acute sinusitis 03/19/2020   Suspected pulmonary embolism 03/11/2019   Dyspnea on exertion 01/16/2019   Gait disturbance 03/15/2017   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   History of diabetes mellitus 05/31/2016   History of hypothyroidism 05/31/2016   History of gastroesophageal reflux (GERD) 05/31/2016   Rheumatoid arthritis (HCC) 04/12/2016   High risk medication use 04/12/2016   Heart disease 03/01/2016   Hyperlipidemia 02/29/2016   Hypertension 02/29/2016   Hypothyroidism 02/29/2016   Obesity (BMI 35.0-39.9 without comorbidity) 02/29/2016   Acid reflux 02/29/2016   Progressive angina (HCC) 08/11/2015   Diet-controlled diabetes mellitus (HCC) 08/11/2015   Chronic idiopathic constipation 07/02/2015   Chronic systolic congestive heart failure (HCC) 07/02/2015   Hiatal hernia 07/02/2015   Malaise and fatigue 07/02/2015   Major depressive disorder, recurrent episode, moderate (HCC) 07/02/2015   Coronary artery disease involving native coronary artery of native heart with unstable angina pectoris (HCC) 07/02/2015   Epigastric pain 04/08/2015    Conditions to be addressed/monitored: Anxiety, Depression, Post-Traumatic Stress Disorder, Chronic Pain and Marital Discord.  Limited Social Support, Housing Barriers, Medication Procurement, Mental Health Concerns, Family and Relationship Dysfunction, Social Isolation, Limited Access to Caregiver, Memory Deficits, and Lacks Knowledge of [employer].  Care Plan : LCSW Plan of Care  Updates made by [name], LCSW since 04/06/2021 12:00 AM     Problem: Improve My Quality of Life.   Priority: High     Long-Range Goal: Improve  My Quality of Life.   Start Date: 12/29/2020  Expected End Date: 05/31/2021  This Visit's Progress: On track  Recent Progress: On track  Priority: High  Note:   Current Barriers:   Acute Mental Health needs related to Anxiety, Depression, Post-Traumatic Stress Disorder, Chronic Pain, Sheltered Homelessness and Marital Discord needs Support, Education, Resources, Referrals and Care Coordination in order to meet unmet mental health and housing needs. Clinical Goal(s):  Patient will work with LCSW to reduce and manage symptoms of Anxiety, Depression, Post-Traumatic Stress Disorder, Chronic Pain, Sheltered Homelessness and Marital Discord. Patient will increase knowledge and/or ability of: Coping Skills, Healthy Habits, Self-Management Skills, Stress Reduction, Home Safety and Utilization of [employer] and Resources. Clinical Interventions: Problem Solving Solutions developed, Verbalization of Feelings encouraged, Active Listening utilized, Emotional Support provided, Psychotropic Medications reviewed and Compliance emphasized, and Cognitive Behavioral Therapy performed. Collaboration with Primary Care Physician, Dr. Brent Bulla regarding development and update of comprehensive plan of care as evidenced by provider attestation and co-signature. Collaboration with Primary Care Physician, Dr. Brent Bulla to report the following discrepancies: Thorough review of documentation from follow-up visit on 04/05/2021 indicates that patient's depression is uncontrolled with Aripiprazole (Abilify) 2 mg tablet, po, daily and Bupropion (  Wellbutrin XL) 300 mg tablet, po, daily, and for her to stop taking Aripiprazole.   ~ Patient does not recall, nor did she report receiving an After Visit Summary for clarification. ~ Collaboration with Primary Care Physician, Dr. Brent Bulla to pose the following questions:  Is patient to stop taking Aripiprazole?  And if so, does she need to taper dose, or can she  stop abruptly? Is patient to stop taking Bupropion?  Or, is she to decrease Bupropion dose from 300 mg tablet, po, daily to 150 mg tablet, po, daily?  And if so, does she need to taper dose, or can she stop abruptly?  Will new antidepressant medication(s) be prescribed?  ~ Patient is willing to consider new antidepressant medication regimen. ~ Does patient need to contact your office to schedule a follow-up appointment?  And if so, when would you like for her to return? Inter-disciplinary care team collaboration (see longitudinal plan of care). Patient Goals/Self-Care Activities: Continue to receive personal counseling with LCSW, on a bi-weekly basis, to reduce and manage symptoms of Anxiety, Depression, Post-Traumatic Stress Disorder and Marital Discord, until well-managed. LCSW collaboration with Primary Care Physician, Dr. Brent Bulla to obtain clarification on current antidepressant medication regimen, as well as to report your willingness to try a new combination of antidepressant medications. LCSW collaboration with Primary Care Physician, Dr. Brent Bulla to inquire about need for follow-up appointment. Continue to make travel arrangements to visit son in [place], from 05/06/2021 thru 05/14/2021. Please accept all calls from [facility], in an effort to obtain housing resources. Contact LCSW directly (# [phone]) if you have questions, need assistance, or if additional social work needs are identified between now and our next scheduled telephone outreach call. Follow-Up Date:  04/20/2021 at 9:45 am    Danford Bad LCSW Licensed Clinical Social Worker [facility] [phone]

## 2021-04-13 ENCOUNTER — Other Ambulatory Visit: Payer: Self-pay

## 2021-04-13 DIAGNOSIS — F332 Major depressive disorder, recurrent severe without psychotic features: Secondary | ICD-10-CM

## 2021-04-13 DIAGNOSIS — K224 Dyskinesia of esophagus: Secondary | ICD-10-CM

## 2021-04-13 DIAGNOSIS — E039 Hypothyroidism, unspecified: Secondary | ICD-10-CM

## 2021-04-13 MED ORDER — LEVOTHYROXINE SODIUM 25 MCG PO TABS: 25.0000 ug | ORAL_TABLET | Freq: Every day | ORAL | 2 refills | Status: DC

## 2021-04-13 MED ORDER — BUPROPION HCL ER (XL) 300 MG PO TB24: 300.0000 mg | ORAL_TABLET | Freq: Every day | ORAL | 2 refills | Status: DC

## 2021-04-13 MED ORDER — METOPROLOL TARTRATE 25 MG PO TABS: 25.0000 mg | ORAL_TABLET | Freq: Two times a day (BID) | ORAL | 2 refills | Status: DC

## 2021-04-13 MED ORDER — ARIPIPRAZOLE 2 MG PO TABS: 2.0000 mg | ORAL_TABLET | Freq: Every day | ORAL | 2 refills | Status: DC

## 2021-04-13 MED ORDER — TRAZODONE HCL 50 MG PO TABS: 50.0000 mg | ORAL_TABLET | Freq: Every day | ORAL | 1 refills | Status: DC

## 2021-04-13 MED ORDER — OMEPRAZOLE 20 MG PO CPDR: 20.0000 mg | DELAYED_RELEASE_CAPSULE | Freq: Two times a day (BID) | ORAL | 2 refills | Status: DC

## 2021-04-13 MED ORDER — FOLIC ACID 1 MG PO TABS: 1.0000 mg | ORAL_TABLET | Freq: Every day | ORAL | 2 refills | Status: DC

## 2021-04-18 ENCOUNTER — Ambulatory Visit: Payer: Medicare HMO | Admitting: Gastroenterology

## 2021-04-18 ENCOUNTER — Encounter: Payer: Self-pay | Admitting: Gastroenterology

## 2021-04-18 VITALS — BP 110/70 | HR 72 | Ht 62.5 in | Wt 208.4 lb

## 2021-04-18 DIAGNOSIS — K449 Diaphragmatic hernia without obstruction or gangrene: Secondary | ICD-10-CM

## 2021-04-18 DIAGNOSIS — R112 Nausea with vomiting, unspecified: Secondary | ICD-10-CM

## 2021-04-18 NOTE — Patient Instructions (Signed)
Please have your husband contact our office with more information of your previous gastroenterologist. Our phone number is [phone].  You have been scheduled for an Upper GI Series at [facility]. Your appointment is on 04/23/21 at 10:30am. Please arrive 15 minutes prior to your test for registration. Make sure not to eat or drink anything after midnight on the night before your test. If you need to reschedule, please call radiology at [phone]. __________________________________________________________ An upper GI series uses x rays to help diagnose problems of the upper GI tract, which includes the esophagus, stomach, and duodenum. The duodenum is the first part of the small intestine. An upper GI series is conducted by a radiology technologist or a radiologist--a doctor who specializes in x-ray imaging--at a hospital or outpatient center. While sitting or standing in front of an x-ray machine, the patient drinks barium liquid, which is often white and has a chalky consistency and taste. The barium liquid coats the lining of the upper GI tract and makes signs of disease show up more clearly on x rays. X-ray video, called fluoroscopy, is used to view the barium liquid moving through the esophagus, stomach, and duodenum. Additional x rays and fluoroscopy are performed while the patient lies on an x-ray table. To fully coat the upper GI tract with barium liquid, the technologist or radiologist may press on the abdomen or ask the patient to change position. Patients hold still in various positions, allowing the technologist or radiologist to take x rays of the upper GI tract at different angles. If a technologist conducts the upper GI series, a radiologist will later examine the images to look for problems.  This test typically takes about 1 hour to complete. __________________________________________________________  Due to recent changes in healthcare laws, you may see the results of  your imaging and laboratory studies on MyChart before your provider has had a chance to review them.  We understand that in some cases there may be results that are confusing or concerning to you. Not all laboratory results come back in the same time frame and the provider may be waiting for multiple results in order to interpret others.  Please give [place] 48 hours in order for your provider to thoroughly review all the results before contacting the office for clarification of your results.   The [place] [facility] providers would like to encourage you to use [facility] to communicate with providers for non-urgent requests or questions.  Due to long hold times on the telephone, sending your provider a message by [facility] may be a faster and more efficient way to get a response.  Please allow 48 business hours for a response.  Please remember that this is for non-urgent requests.   Thank you for choosing me and [facility].  Venita Lick. Pleas Koch., MD., Clementeen Graham

## 2021-04-18 NOTE — Progress Notes (Signed)
History of Present Illness: This is a 75 year old female referred by Abigail Miyamoto,* MD for the evaluation of LUQ pain, GERD, N/V, regurgitation, early satiety.  She is unaccompanied.  She relates worsening problems with reflux, regurgitation, nausea, vomiting and early satiety over the past few months.  She relates left chest pain and being diagnosed with a left 5th rib fracture several months ago. Her chest pain has gradually improved.  On occasion her left chest pain radiated to her LUQ however this has resolved.  She has a difficult time recalling details of her prior history.  She was previously seen by Dr. Jennye Boroughs in [place]. She was also seen by Dr. Ruthell Rummage at [facility] in 2016 for GERD and dysphagia.  Unfortunately I do not have GI records from Dr. Sheppard Penton or Dr. Jennye Boroughs at this time.  The patient cannot recall when she had her last upper endoscopy but it is possible it was 2016 at [facility].  She relates a prior colonoscopy but does not recall the results, date, location or physician.  She states her husband may recall more details about her past GI history so we can obtain records, procedure reports.  CT AP 9/302020 showed a large HH with entire stomach in the chest and sigmoid diverticulosis.  Denies weight loss, abdominal pain, constipation, diarrhea, change in stool caliber, melena, hematochezia, nausea, vomiting, dysphagia, reflux symptoms, chest pain.     Allergies  Allergen Reactions   Codeine Anaphylaxis   Iodine Anaphylaxis   Shellfish Allergy Anaphylaxis        Outpatient Medications Prior to Visit  Medication Sig Dispense Refill   ALPRAZolam (XANAX) 0.5 MG tablet Take 0.25 mg by mouth as needed for anxiety.     ARIPiprazole (ABILIFY) 2 MG tablet Take 1 tablet (2 mg total) by mouth daily. 90 tablet 2   aspirin EC 81 MG tablet Take 81 mg by mouth daily.     bumetanide (BUMEX) 1 MG tablet Take 1 mg by mouth daily as needed for edema (swelling).     buPROPion  (WELLBUTRIN XL) 300 MG 24 hr tablet Take 1 tablet (300 mg total) by mouth at bedtime. 90 tablet 2   Cholecalciferol (VITAMIN D) 2000 units CAPS Take 2,000 Units by mouth daily.     diltiazem (CARDIZEM CD) 120 MG 24 hr capsule Take 1 capsule (120 mg total) by mouth daily. 90 capsule 3   folic acid (FOLVITE) 1 MG tablet Take 1 tablet (1 mg total) by mouth daily. 90 tablet 2   isosorbide mononitrate (IMDUR) 30 MG 24 hr tablet Take 30 mg by mouth daily.     latanoprost (XALATAN) 0.005 % ophthalmic solution Place 1 drop into both eyes at bedtime.     levothyroxine (SYNTHROID) 25 MCG tablet Take 1 tablet (25 mcg total) by mouth daily before breakfast. 90 tablet 2   methotrexate (RHEUMATREX) 2.5 MG tablet Take 25 mg by mouth once a week. Caution:Chemotherapy. Protect from light.  Takes 5 tablets in the am and 5 tablets in the pm once a week on Sundays     metoprolol tartrate (LOPRESSOR) 25 MG tablet Take 1 tablet (25 mg total) by mouth 2 (two) times daily. 180 tablet 2   Multiple Vitamin (MULTIVITAMIN) capsule Take 1 capsule by mouth daily.     nitroGLYCERIN (NITROSTAT) 0.4 MG SL tablet Place 1 tablet (0.4 mg total) under the tongue every 5 (five) minutes as needed for chest pain. 25 tablet 0   omeprazole (PRILOSEC)  20 MG capsule Take 1 capsule (20 mg total) by mouth in the morning and at bedtime. 180 capsule 2   polyethylene glycol (MIRALAX / GLYCOLAX) packet Take 17 g by mouth daily as needed for mild constipation.      pravastatin (PRAVACHOL) 20 MG tablet Take 20 mg by mouth daily.     sertraline (ZOLOFT) 100 MG tablet Take 50 mg by mouth daily.     traMADol (ULTRAM) 50 MG tablet Take 1 tablet (50 mg total) by mouth every 6 (six) hours as needed. 30 tablet 0   traZODone (DESYREL) 50 MG tablet Take 1 tablet (50 mg total) by mouth at bedtime. 90 tablet 1   Vitamin D, Ergocalciferol, (DRISDOL) 50000 units CAPS capsule Take 50,000 Units by mouth every Sunday.      No facility-administered medications  prior to visit.   Past Medical History:  Diagnosis Date   Acid reflux 02/29/2016   Acute sinusitis 03/19/2020   Chronic idiopathic constipation 07/02/2015   Chronic pain syndrome 11/30/2020   Chronic systolic congestive heart failure (HCC) 07/02/2015   Coronary artery disease involving native coronary artery of native heart with unstable angina pectoris (HCC) 07/02/2015   Formatting of this note might be different from the original. [place]   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   Depression    Depression, major, single episode, moderate (HCC) 08/31/2020   Diabetes mellitus without complication (HCC)    Diet-controlled diabetes mellitus (HCC) 08/11/2015   Dyspnea on exertion 01/16/2019   Epigastric pain 04/08/2015   Gait disturbance 03/15/2017   Heart disease    Hernia, hiatal    Hiatal hernia 07/02/2015   High cholesterol    High risk medication use 04/12/2016   Methotrexate PLQ Eye Exam: 07/28/16 WNL @ [facility] Follow up in 6 months.    History of diabetes mellitus 05/31/2016   History of gastroesophageal reflux (GERD) 05/31/2016   History of hypothyroidism 05/31/2016   Hyperlipidemia 02/29/2016   Hypertension    Hypothyroidism    Major depressive disorder, recurrent episode, moderate (HCC) 07/02/2015   Malaise and fatigue 07/02/2015   Memory difficulty    Obesity (BMI 35.0-39.9 without comorbidity) 02/29/2016   Obesity, diabetes, and hypertension syndrome (HCC) 07/29/2020   Progressive angina (HCC) 08/11/2015   PTSD (post-traumatic stress disorder) 11/30/2020   Rheumatoid arthritis (HCC) 04/12/2016   Spastic esophagus    Suspected pulmonary embolism 03/11/2019   Past Surgical History:  Procedure Laterality Date   ABDOMINAL HYSTERECTOMY     BACK SURGERY     CARDIAC CATHETERIZATION N/A 08/12/2015   Procedure: Left Heart Cath and Coronary Angiography;  Surgeon: Yates Decamp, MD;  Location: [facility] INVASIVE CV LAB;  Service: Cardiovascular;  Laterality: N/A;   LEFT HEART CATH  AND CORONARY ANGIOGRAPHY N/A 02/20/2018   Procedure: LEFT HEART CATH AND CORONARY ANGIOGRAPHY;  Surgeon: Lennette Bihari, MD;  Location: [facility] INVASIVE CV LAB;  Service: Cardiovascular;  Laterality: N/A;   LEFT HEART CATH AND CORONARY ANGIOGRAPHY N/A 01/19/2020   Procedure: LEFT HEART CATH AND CORONARY ANGIOGRAPHY;  Surgeon: Marykay Lex, MD;  Location: [facility] INVASIVE CV LAB;  Service: Cardiovascular;  Laterality: N/A;   TOTAL ABDOMINAL HYSTERECTOMY W/ BILATERAL SALPINGOOPHORECTOMY     Social History   Socioeconomic History   Marital status: Married    Spouse name: Zalaya Astarita   Number of children: 2   Years of education: 12   Highest education level: Some college, no degree  Occupational History   Occupation: retired  Tobacco Use  Smoking status: Never    Passive exposure: Never   Smokeless tobacco: Never  Vaping Use   Vaping Use: Never used  Substance and Sexual Activity   Alcohol use: No   Drug use: No   Sexual activity: Yes    Partners: Male  Other Topics Concern   Not on file  Social History Narrative   Not on file   Social Determinants of Health   Financial Resource Strain: High Risk   Difficulty of Paying Living Expenses: Very hard  Food Insecurity: No Food Insecurity   Worried About Programme researcher, broadcasting/film/video in the Last Year: Never true   Ran Out of Food in the Last Year: Never true  Transportation Needs: No Transportation Needs   Lack of Transportation (Medical): No   Lack of Transportation (Non-Medical): No  Physical Activity: Inactive   Days of Exercise per Week: 0 days   Minutes of Exercise per Session: 0 min  Stress: Stress Concern Present   Feeling of Stress : To some extent  Social Connections: Moderately Integrated   Frequency of Communication with Friends and Family: More than three times a week   Frequency of Social Gatherings with Friends and Family: More than three times a week   Attends Religious Services: 1 to 4 times per year   Active Member of  [employer] or Organizations: No   Attends Engineer, structural: Never   Marital Status: Married   Family History  Problem Relation Age of Onset   Heart disease Mother    Diabetes Mother    Hypertension Mother        after birth   Heart disease Father    Heart attack Father 62   Hypertension Sister    Diabetes Sister    Diabetes Maternal Grandmother    Kidney disease Daughter        kidney removed after birth   Diabetes Maternal Aunt        all 7 maternal Aunts   Other Son        Adopted   Breast cancer Neg Hx        Review of Systems: Pertinent positive and negative review of systems were noted in the above HPI section. All other review of systems were otherwise negative.   Physical Exam: General: Well developed, well nourished, no acute distress Head: Normocephalic and atraumatic Eyes: Sclerae anicteric, EOMI Ears: Normal auditory acuity Mouth: Not examined, mask on during Covid-19 pandemic Neck: Supple, no masses or thyromegaly Lungs: Clear throughout to auscultation Chest: Tenderness over the lower sternum and xiphoid process Heart: Regular rate and rhythm; no murmurs, rubs or bruits Abdomen: Soft, non tender and non distended. No masses, hepatosplenomegaly or hernias noted. Normal Bowel sounds Rectal: Not done Musculoskeletal: Symmetrical with no gross deformities  Skin: No lesions on visible extremities Pulses:  Normal pulses noted Extremities: No clubbing, cyanosis, edema or deformities noted Neurological: Alert oriented x 4, grossly nonfocal Cervical Nodes:  No significant cervical adenopathy Inguinal Nodes: No significant inguinal adenopathy Psychological:  Alert and cooperative. Normal mood and affect   Assessment and Recommendations:  Nausea, vomiting, regurgitation, GERD, early satiety, large HH with entire stomach in chest per CT AP.  Suspect her symptoms are related to her large hiatal hernia and GERD.  Schedule UGI series.  Asked the patient to  have her husband contact us so we can attempt to get her prior GI records.  Closely follow antireflux measures.  Continue omeprazole 20 mg p.o. twice daily.  Pepcid AC twice daily as needed for breakthrough symptoms. REV in 6 weeks.  LUQ abdominal pain, resolved.  Likely referred from left rib fracture. Chest wall tenderness. Per PCP.    cc: Abigail Miyamoto, MD [address] [place],  [place] [address]

## 2021-04-20 ENCOUNTER — Ambulatory Visit: Payer: Medicare HMO | Admitting: *Deleted

## 2021-04-20 DIAGNOSIS — E669 Obesity, unspecified: Secondary | ICD-10-CM

## 2021-04-20 DIAGNOSIS — E039 Hypothyroidism, unspecified: Secondary | ICD-10-CM

## 2021-04-20 DIAGNOSIS — R269 Unspecified abnormalities of gait and mobility: Secondary | ICD-10-CM

## 2021-04-20 DIAGNOSIS — I10 Essential (primary) hypertension: Secondary | ICD-10-CM

## 2021-04-20 DIAGNOSIS — F331 Major depressive disorder, recurrent, moderate: Secondary | ICD-10-CM

## 2021-04-20 DIAGNOSIS — R5383 Other fatigue: Secondary | ICD-10-CM

## 2021-04-20 DIAGNOSIS — I152 Hypertension secondary to endocrine disorders: Secondary | ICD-10-CM

## 2021-04-20 DIAGNOSIS — F321 Major depressive disorder, single episode, moderate: Secondary | ICD-10-CM

## 2021-04-20 DIAGNOSIS — I25110 Atherosclerotic heart disease of native coronary artery with unstable angina pectoris: Secondary | ICD-10-CM

## 2021-04-20 DIAGNOSIS — E782 Mixed hyperlipidemia: Secondary | ICD-10-CM

## 2021-04-20 DIAGNOSIS — R5381 Other malaise: Secondary | ICD-10-CM

## 2021-04-20 NOTE — Patient Instructions (Signed)
Visit Information  Thank you for taking time to visit with me today. Please don't hesitate to contact me if I can be of assistance to you before our next scheduled telephone appointment.  Following are the goals we discussed today:   Patient Goals/Self-Care Activities: Continue to receive personal counseling with LCSW, on a bi-weekly basis, to reduce and manage symptoms of Anxiety, Depression, Post-Traumatic Stress Disorder and Marital Discord, until well-managed. Please keep appointment with [facility] Radiology Department, scheduled for 04/25/2021 at 10:30 am. ~ During this appointment, you will undergo an Upper Gastrointestinal Series, ordered by Dr. Claudette Head, Gastroenterologist with [facility].   ~ Dr. Russella Dar has already reviewed results from Computed Tomography Arterial Portography with you, indicating that your symptoms of nausea, vomiting, regurgitation, and early satiety are likely symptoms associated with Gastroesophageal Reflux Disease and large Hiatal Hernia, with entire stomach in chest.  ~ Once results of Upper Gastrointestinal Series are received, we will discuss, review procedures, and weigh all of your options.  Enjoy your visit with your son in [place], from 05/06/2021 thru 05/14/2021. Contact LCSW directly (# [phone]) if you have questions, need assistance, or if additional social work needs are identified between now and our next scheduled telephone outreach call.  Follow-Up Date:  05/19/2021 at 12:45 pm  Please call the care guide team at [phone] if you need to cancel or reschedule your appointment.   If you are experiencing a Mental Health or Behavioral Health Crisis or need someone to talk to, please call the Suicide and Crisis Lifeline: 988 call the [place] National Suicide Prevention Lifeline: 804-684-4213 or TTY: (276)519-8239 TTY 548 025 4440) to talk to a trained counselor call 1-800-273-TALK (toll free, 24 hour hotline) go to [facility] [address] [place] [phone]) call the [facility] Crisis Line: [phone] call 911   Patient verbalizes understanding of instructions provided today and agrees to view in MyChart.   Danford Bad LCSW Licensed Clinical Social Worker [facility] [phone]

## 2021-04-20 NOTE — Chronic Care Management (AMB) (Signed)
Chronic Care Management    Clinical Social Work Note  04/20/2021 Name: Deborah Farley MRN: [id number] DOB: 05/29/1945  Deborah Farley is a 75 y.o. year old female who is a primary care patient of Marina Goodell Mickle Mallory, MD. The CCM team was consulted to assist the patient with chronic disease management and/or care coordination needs related to: [employer], Mental Health Counseling and Resources, and Grief Counseling.   Engaged with patient by telephone for follow up visit in response to provider referral for social work chronic care management and care coordination services.   Consent to Services:  The patient was given information about Chronic Care Management services, agreed to services, and gave verbal consent prior to initiation of services.  Please see initial visit note for detailed documentation.   Patient agreed to services and consent obtained.   Assessment: Review of patient past medical history, allergies, medications, and health status, including review of relevant consultants reports was performed today as part of a comprehensive evaluation and provision of chronic care management and care coordination services.     SDOH (Social Determinants of Health) assessments and interventions performed:    Advanced Directives Status: Not addressed in this encounter.  CCM Care Plan  Allergies  Allergen Reactions   Codeine Anaphylaxis   Iodine Anaphylaxis   Shellfish Allergy Anaphylaxis         Outpatient Encounter Medications as of 04/20/2021  Medication Sig   ALPRAZolam (XANAX) 0.5 MG tablet Take 0.25 mg by mouth as needed for anxiety.   ARIPiprazole (ABILIFY) 2 MG tablet Take 1 tablet (2 mg total) by mouth daily.   aspirin EC 81 MG tablet Take 81 mg by mouth daily.   bumetanide (BUMEX) 1 MG tablet Take 1 mg by mouth daily as needed for edema (swelling).   buPROPion (WELLBUTRIN XL) 300 MG 24 hr tablet Take 1 tablet (300 mg total) by mouth at bedtime.    Cholecalciferol (VITAMIN D) 2000 units CAPS Take 2,000 Units by mouth daily.   diltiazem (CARDIZEM CD) 120 MG 24 hr capsule Take 1 capsule (120 mg total) by mouth daily.   folic acid (FOLVITE) 1 MG tablet Take 1 tablet (1 mg total) by mouth daily.   isosorbide mononitrate (IMDUR) 30 MG 24 hr tablet Take 30 mg by mouth daily.   latanoprost (XALATAN) 0.005 % ophthalmic solution Place 1 drop into both eyes at bedtime.   levothyroxine (SYNTHROID) 25 MCG tablet Take 1 tablet (25 mcg total) by mouth daily before breakfast.   methotrexate (RHEUMATREX) 2.5 MG tablet Take 25 mg by mouth once a week. Caution:Chemotherapy. Protect from light.  Takes 5 tablets in the am and 5 tablets in the pm once a week on Sundays   metoprolol tartrate (LOPRESSOR) 25 MG tablet Take 1 tablet (25 mg total) by mouth 2 (two) times daily.   Multiple Vitamin (MULTIVITAMIN) capsule Take 1 capsule by mouth daily.   nitroGLYCERIN (NITROSTAT) 0.4 MG SL tablet Place 1 tablet (0.4 mg total) under the tongue every 5 (five) minutes as needed for chest pain.   omeprazole (PRILOSEC) 20 MG capsule Take 1 capsule (20 mg total) by mouth in the morning and at bedtime.   polyethylene glycol (MIRALAX / GLYCOLAX) packet Take 17 g by mouth daily as needed for mild constipation.    pravastatin (PRAVACHOL) 20 MG tablet Take 20 mg by mouth daily.   sertraline (ZOLOFT) 100 MG tablet Take 50 mg by mouth daily.   traMADol (ULTRAM) 50 MG tablet Take 1 tablet (50  mg total) by mouth every 6 (six) hours as needed.   traZODone (DESYREL) 50 MG tablet Take 1 tablet (50 mg total) by mouth at bedtime.   Vitamin D, Ergocalciferol, (DRISDOL) 50000 units CAPS capsule Take 50,000 Units by mouth every Sunday.    No facility-administered encounter medications on file as of 04/20/2021.    Patient Active Problem List   Diagnosis Date Noted   Degeneration of lumbar intervertebral disc 03/02/2021   Depression 02/17/2021   Chronic pain syndrome 11/30/2020   PTSD  (post-traumatic stress disorder) 11/30/2020   Depression, major, single episode, moderate (HCC) 08/31/2020   Obesity, diabetes, and hypertension syndrome (HCC) 07/29/2020   Hernia, hiatal    High cholesterol    Memory difficulty    Spastic esophagus    Acute sinusitis 03/19/2020   Suspected pulmonary embolism 03/11/2019   Dyspnea on exertion 01/16/2019   Gait disturbance 03/15/2017   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   History of diabetes mellitus 05/31/2016   History of hypothyroidism 05/31/2016   History of gastroesophageal reflux (GERD) 05/31/2016   Rheumatoid arthritis (HCC) 04/12/2016   High risk medication use 04/12/2016   Heart disease 03/01/2016   Hyperlipidemia 02/29/2016   Hypertension 02/29/2016   Hypothyroidism 02/29/2016   Obesity (BMI 35.0-39.9 without comorbidity) 02/29/2016   Acid reflux 02/29/2016   Progressive angina (HCC) 08/11/2015   Diet-controlled diabetes mellitus (HCC) 08/11/2015   Chronic idiopathic constipation 07/02/2015   Chronic systolic congestive heart failure (HCC) 07/02/2015   Hiatal hernia 07/02/2015   Malaise and fatigue 07/02/2015   Major depressive disorder, recurrent episode, moderate (HCC) 07/02/2015   Coronary artery disease involving native coronary artery of native heart with unstable angina pectoris (HCC) 07/02/2015   Epigastric pain 04/08/2015    Conditions to be addressed/monitored: Anxiety and Depression.  Corporate treasurer, Limited Social Support, Housing Barriers, Mental Health Concerns, Family and Relationship Dysfunction, Social Isolation, Marital Discord, Limited Access to Engineer, structural, Memory Deficits, and Lacks Knowledge of [employer].  Care Plan : LCSW Plan of Care  Updates made by [name], LCSW since 04/20/2021 12:00 AM     Problem: Improve My Quality of Life.   Priority: High     Long-Range Goal: Improve My Quality of Life.   Start Date: 12/29/2020  Expected End Date: 05/31/2021  This Visit's  Progress: On track  Recent Progress: On track  Priority: High  Note:   Current Barriers:   Acute Mental Health needs related to Anxiety, Depression, Post-Traumatic Stress Disorder, Chronic Pain, Sheltered Homelessness and Marital Discord needs Support, Education, Resources, Referrals and Care Coordination in order to meet unmet mental health and housing needs. Clinical Goal(s):  Patient will work with LCSW to reduce and manage symptoms of Anxiety, Depression, Post-Traumatic Stress Disorder, Chronic Pain, Sheltered Homelessness and Marital Discord. Patient will increase knowledge and/or ability of: Coping Skills, Healthy Habits, Self-Management Skills, Stress Reduction, Home Safety and Utilization of [employer] and Resources. Clinical Interventions: Problem Solving Solutions reviewed, Verbalization of Feelings encouraged, Active Listening utilized, Emotional Support provided, Psychotropic Medications reviewed and Compliance emphasized, and Cognitive Behavioral Therapy performed. Collaboration with Primary Care Physician, Dr. Brent Bulla regarding development and update of comprehensive plan of care as evidenced by provider attestation and co-signature. Inter-disciplinary care team collaboration (see longitudinal plan of care). Patient Goals/Self-Care Activities: Continue to receive personal counseling with LCSW, on a bi-weekly basis, to reduce and manage symptoms of Anxiety, Depression, Post-Traumatic Stress Disorder and Marital Discord, until well-managed. Please keep appointment with [facility] Radiology Department, scheduled for  04/25/2021 at 10:30 am. ~ During this appointment, you will undergo an Upper Gastrointestinal Series, ordered by Dr. Claudette Head, Gastroenterologist with [facility].   ~ Dr. Russella Dar has already reviewed results from Computed Tomography Arterial Portography with you, indicating that your symptoms of nausea, vomiting, regurgitation, and early  satiety are likely symptoms associated with Gastroesophageal Reflux Disease and large Hiatal Hernia, with entire stomach in chest.  ~ Once results of Upper Gastrointestinal Series are received, we will discuss, review procedures, and weigh all of your options.  Enjoy your visit with your son in [place], from 05/06/2021 thru 05/14/2021. Contact LCSW directly (# [phone]) if you have questions, need assistance, or if additional social work needs are identified between now and our next scheduled telephone outreach call. Follow-Up Date:  05/19/2021 at 12:45 pm    Danford Bad LCSW Licensed Clinical Social Worker [facility] [phone]

## 2021-04-25 ENCOUNTER — Other Ambulatory Visit: Payer: Self-pay

## 2021-04-25 ENCOUNTER — Ambulatory Visit (HOSPITAL_COMMUNITY)
Admission: RE | Admit: 2021-04-25 | Discharge: 2021-04-25 | Disposition: A | Discharge status: Home / Self Care | Payer: Medicare HMO | Source: Ambulatory Visit | Attending: Gastroenterology | Admitting: Gastroenterology

## 2021-04-25 DIAGNOSIS — R112 Nausea with vomiting, unspecified: Secondary | ICD-10-CM

## 2021-04-25 DIAGNOSIS — K449 Diaphragmatic hernia without obstruction or gangrene: Secondary | ICD-10-CM | POA: Diagnosis not present

## 2021-04-25 DIAGNOSIS — K224 Dyskinesia of esophagus: Secondary | ICD-10-CM | POA: Diagnosis not present

## 2021-04-25 DIAGNOSIS — K219 Gastro-esophageal reflux disease without esophagitis: Secondary | ICD-10-CM | POA: Diagnosis not present

## 2021-04-25 DIAGNOSIS — K3189 Other diseases of stomach and duodenum: Secondary | ICD-10-CM | POA: Diagnosis not present

## 2021-04-26 ENCOUNTER — Telehealth: Payer: Medicare HMO

## 2021-04-26 ENCOUNTER — Telehealth: Payer: Self-pay | Admitting: Gastroenterology

## 2021-04-26 DIAGNOSIS — K449 Diaphragmatic hernia without obstruction or gangrene: Secondary | ICD-10-CM

## 2021-04-26 DIAGNOSIS — R112 Nausea with vomiting, unspecified: Secondary | ICD-10-CM

## 2021-04-26 DIAGNOSIS — R933 Abnormal findings on diagnostic imaging of other parts of digestive tract: Secondary | ICD-10-CM

## 2021-04-26 NOTE — Telephone Encounter (Signed)
Patient returned your phone call.  Please call back.  Thank you. 

## 2021-04-26 NOTE — Telephone Encounter (Signed)
See results for details.  

## 2021-05-11 ENCOUNTER — Encounter: Payer: Medicare HMO | Admitting: Gastroenterology

## 2021-05-17 ENCOUNTER — Ambulatory Visit: Payer: Self-pay | Admitting: Surgery

## 2021-05-17 ENCOUNTER — Telehealth: Payer: Self-pay

## 2021-05-17 DIAGNOSIS — R112 Nausea with vomiting, unspecified: Secondary | ICD-10-CM

## 2021-05-17 DIAGNOSIS — R0601 Orthopnea: Secondary | ICD-10-CM | POA: Diagnosis not present

## 2021-05-17 DIAGNOSIS — R0609 Other forms of dyspnea: Secondary | ICD-10-CM | POA: Diagnosis not present

## 2021-05-17 DIAGNOSIS — K3189 Other diseases of stomach and duodenum: Secondary | ICD-10-CM | POA: Insufficient documentation

## 2021-05-17 DIAGNOSIS — K440 Diaphragmatic hernia with obstruction, without gangrene: Secondary | ICD-10-CM | POA: Diagnosis not present

## 2021-05-17 DIAGNOSIS — E119 Type 2 diabetes mellitus without complications: Secondary | ICD-10-CM | POA: Diagnosis not present

## 2021-05-17 DIAGNOSIS — Z8719 Personal history of other diseases of the digestive system: Secondary | ICD-10-CM | POA: Diagnosis not present

## 2021-05-17 DIAGNOSIS — Z6837 Body mass index (BMI) 37.0-37.9, adult: Secondary | ICD-10-CM | POA: Insufficient documentation

## 2021-05-17 HISTORY — DX: Other diseases of stomach and duodenum: K31.89

## 2021-05-17 HISTORY — DX: Nausea with vomiting, unspecified: R11.2

## 2021-05-17 NOTE — Telephone Encounter (Signed)
° °  Pre-operative Risk Assessment    Patient Name: Deborah Farley  DOB: 1945-05-23 MRN: [id number]      Request for Surgical Clearance    Procedure:   Hiatal hernia repair  Date of Surgery:  Clearance TBD                                 Surgeon:   Surgeon's Group or Practice Name:  [facility] Phone number:  [phone] Fax number:  [phone] Attention Ethlyn Gallery   Type of Clearance Requested:   - Medical    Type of Anesthesia:  General    Additional requests/questions:    SignedDione Housekeeper   05/17/2021, 4:28 PM

## 2021-05-17 NOTE — Telephone Encounter (Signed)
° °  Primary Cardiologist: Garwin Brothers, MD  Chart reviewed as part of pre-operative protocol coverage. Given past medical history and time since last visit, based on ACC/AHA guidelines, Tarica Harl would be at acceptable risk for the planned procedure without further cardiovascular testing.   Her RCRI is a class III risk, 6.6% risk of major cardiac event.  Patient was advised that if she develops new symptoms prior to surgery to contact our office to arrange a follow-up appointment.  He verbalized understanding.  I will route this recommendation to the requesting party via Epic fax function and remove from pre-op pool.  Please call with questions.  Thomasene Ripple. Daylon Lafavor NP-C    05/17/2021, 4:46 PM [facility] [address] Office [phone] Fax [phone]

## 2021-05-19 ENCOUNTER — Ambulatory Visit (INDEPENDENT_AMBULATORY_CARE_PROVIDER_SITE_OTHER): Payer: Medicare Other | Admitting: *Deleted

## 2021-05-19 DIAGNOSIS — I200 Unstable angina: Secondary | ICD-10-CM

## 2021-05-19 DIAGNOSIS — R5383 Other fatigue: Secondary | ICD-10-CM

## 2021-05-19 DIAGNOSIS — I25110 Atherosclerotic heart disease of native coronary artery with unstable angina pectoris: Secondary | ICD-10-CM

## 2021-05-19 DIAGNOSIS — Z8639 Personal history of other endocrine, nutritional and metabolic disease: Secondary | ICD-10-CM

## 2021-05-19 DIAGNOSIS — E782 Mixed hyperlipidemia: Secondary | ICD-10-CM

## 2021-05-19 DIAGNOSIS — Q245 Malformation of coronary vessels: Secondary | ICD-10-CM

## 2021-05-19 DIAGNOSIS — I152 Hypertension secondary to endocrine disorders: Secondary | ICD-10-CM

## 2021-05-19 DIAGNOSIS — F331 Major depressive disorder, recurrent, moderate: Secondary | ICD-10-CM

## 2021-05-19 DIAGNOSIS — E669 Obesity, unspecified: Secondary | ICD-10-CM

## 2021-05-19 DIAGNOSIS — R269 Unspecified abnormalities of gait and mobility: Secondary | ICD-10-CM

## 2021-05-19 DIAGNOSIS — R5381 Other malaise: Secondary | ICD-10-CM

## 2021-05-19 DIAGNOSIS — F321 Major depressive disorder, single episode, moderate: Secondary | ICD-10-CM

## 2021-05-19 NOTE — Patient Instructions (Signed)
Visit Information  Thank you for taking time to visit with me today. Please don't hesitate to contact me if I can be of assistance to you before our next scheduled telephone appointment.  Following are the goals we discussed today:  Patient Goals/Self-Care Activities: Continue to receive personal counseling with LCSW, on a bi-weekly basis, to reduce and manage symptoms of Anxiety, Depression, Post-Traumatic Stress Disorder and Marital Discord, until well-managed. Happy to hear that you are a candidate to undergo a Robotic Fundoplication and Partial Stomach Wrap, as this will hopefully provide you with a much better quality of life. Review the following educational material, e-mailed ([email or web address]) to you by LCSW on 05/19/2021, and be prepared to discuss during our next scheduled telephone outreach call: ~ Living With an Alcoholic: How to Deal With an Alcoholic Spouse. ~ How to Help an Alcoholic Husband. Contact LCSW directly (# [phone]) if you have questions, need assistance, or if additional social work needs are identified.     Follow-Up Date:  06/06/2021 at 10:00 am  Please call the care guide team at [phone] if you need to cancel or reschedule your appointment.   If you are experiencing a Mental Health or [place] or need someone to talk to, please call the Suicide and Crisis Lifeline: 988 call the [place] National Suicide Prevention Lifeline: 6368302507 or TTY: 519-730-8528 TTY (201)696-6269) to talk to a trained counselor call 1-800-273-TALK (toll free, 24 hour hotline) go to [facility] [address] [place] [phone]) call the [employer]: [phone] call 911   Patient verbalizes understanding of instructions and care plan provided today and agrees to view in [place]. Active MyChart status confirmed with patient.    Nat Christen LCSW Licensed Clinical Social Worker [facility] [phone]

## 2021-05-19 NOTE — Chronic Care Management (AMB) (Signed)
Chronic Care Management    Clinical Social Work Note  05/19/2021 Name: Deborah Farley MRN: [id number] DOB: 19-Apr-1946  Deborah Farley is a 76 y.o. year old female who is a primary care patient of Deborah Goodell Mickle Mallory, MD. The CCM team was consulted to assist the patient with chronic disease management and/or care coordination needs related to: [employer], Mental Health Counseling and Resources, and Caregiver Stress.   Engaged with patient by telephone for follow up visit in response to provider referral for social work chronic care management and care coordination services.   Consent to Services:  The patient was given information about Chronic Care Management services, agreed to services, and gave verbal consent prior to initiation of services.  Please see initial visit note for detailed documentation.   Patient agreed to services and consent obtained.   Assessment: Review of patient past medical history, allergies, medications, and health status, including review of relevant consultants reports was performed today as part of a comprehensive evaluation and provision of chronic care management and care coordination services.     SDOH (Social Determinants of Health) assessments and interventions performed:    Advanced Directives Status: Not addressed in this encounter.  CCM Care Plan  Allergies  Allergen Reactions   Codeine Anaphylaxis   Iodine Anaphylaxis   Shellfish Allergy Anaphylaxis         Outpatient Encounter Medications as of 05/19/2021  Medication Sig   ALPRAZolam (XANAX) 0.5 MG tablet Take 0.25 mg by mouth as needed for anxiety.   ARIPiprazole (ABILIFY) 2 MG tablet Take 1 tablet (2 mg total) by mouth daily.   aspirin EC 81 MG tablet Take 81 mg by mouth daily.   bumetanide (BUMEX) 1 MG tablet Take 1 mg by mouth daily as needed for edema (swelling).   buPROPion (WELLBUTRIN XL) 300 MG 24 hr tablet Take 1 tablet (300 mg total) by mouth at bedtime.    Cholecalciferol (VITAMIN D) 2000 units CAPS Take 2,000 Units by mouth daily.   diltiazem (CARDIZEM CD) 120 MG 24 hr capsule Take 1 capsule (120 mg total) by mouth daily.   folic acid (FOLVITE) 1 MG tablet Take 1 tablet (1 mg total) by mouth daily.   isosorbide mononitrate (IMDUR) 30 MG 24 hr tablet Take 30 mg by mouth daily.   latanoprost (XALATAN) 0.005 % ophthalmic solution Place 1 drop into both eyes at bedtime.   levothyroxine (SYNTHROID) 25 MCG tablet Take 1 tablet (25 mcg total) by mouth daily before breakfast.   methotrexate (RHEUMATREX) 2.5 MG tablet Take 25 mg by mouth once a week. Caution:Chemotherapy. Protect from light.  Takes 5 tablets in the am and 5 tablets in the pm once a week on Sundays   metoprolol tartrate (LOPRESSOR) 25 MG tablet Take 1 tablet (25 mg total) by mouth 2 (two) times daily.   Multiple Vitamin (MULTIVITAMIN) capsule Take 1 capsule by mouth daily.   nitroGLYCERIN (NITROSTAT) 0.4 MG SL tablet Place 1 tablet (0.4 mg total) under the tongue every 5 (five) minutes as needed for chest pain.   omeprazole (PRILOSEC) 20 MG capsule Take 1 capsule (20 mg total) by mouth in the morning and at bedtime.   polyethylene glycol (MIRALAX / GLYCOLAX) packet Take 17 g by mouth daily as needed for mild constipation.    pravastatin (PRAVACHOL) 20 MG tablet Take 20 mg by mouth daily.   sertraline (ZOLOFT) 100 MG tablet Take 50 mg by mouth daily.   traMADol (ULTRAM) 50 MG tablet Take 1 tablet (50  mg total) by mouth every 6 (six) hours as needed.   traZODone (DESYREL) 50 MG tablet Take 1 tablet (50 mg total) by mouth at bedtime.   Vitamin D, Ergocalciferol, (DRISDOL) 50000 units CAPS capsule Take 50,000 Units by mouth every Sunday.    No facility-administered encounter medications on file as of 05/19/2021.    Patient Active Problem List   Diagnosis Date Noted   Degeneration of lumbar intervertebral disc 03/02/2021   Depression 02/17/2021   Chronic pain syndrome 11/30/2020   PTSD  (post-traumatic stress disorder) 11/30/2020   Depression, major, single episode, moderate (HCC) 08/31/2020   Obesity, diabetes, and hypertension syndrome (HCC) 07/29/2020   Hernia, hiatal    High cholesterol    Memory difficulty    Spastic esophagus    Acute sinusitis 03/19/2020   Suspected pulmonary embolism 03/11/2019   Dyspnea on exertion 01/16/2019   Gait disturbance 03/15/2017   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   History of diabetes mellitus 05/31/2016   History of hypothyroidism 05/31/2016   History of gastroesophageal reflux (GERD) 05/31/2016   Rheumatoid arthritis (HCC) 04/12/2016   High risk medication use 04/12/2016   Heart disease 03/01/2016   Hyperlipidemia 02/29/2016   Hypertension 02/29/2016   Hypothyroidism 02/29/2016   Obesity (BMI 35.0-39.9 without comorbidity) 02/29/2016   Acid reflux 02/29/2016   Progressive angina (HCC) 08/11/2015   Diet-controlled diabetes mellitus (HCC) 08/11/2015   Chronic idiopathic constipation 07/02/2015   Chronic systolic congestive heart failure (HCC) 07/02/2015   Hiatal hernia 07/02/2015   Malaise and fatigue 07/02/2015   Major depressive disorder, recurrent episode, moderate (HCC) 07/02/2015   Coronary artery disease involving native coronary artery of native heart with unstable angina pectoris (HCC) 07/02/2015   Epigastric pain 04/08/2015    Conditions to be addressed/monitored: Anxiety and Depression.  Limited Social Support, Mental Health Concerns, Family and Relationship Dysfunction, Marital Discord, Social Isolation, and Lacks Knowledge of [employer].  Care Plan : LCSW Plan of Care  Updates made by [name], LCSW since 05/19/2021 12:00 AM     Problem: Improve My Quality of Life.   Priority: High     Long-Range Goal: Improve My Quality of Life.   Start Date: 12/29/2020  Expected End Date: 06/06/2021  This Visit's Progress: On track  Recent Progress: On track  Priority: High  Note:   Current  Barriers:   Acute Mental Health needs related to Anxiety, Depression, Post-Traumatic Stress Disorder, Chronic Pain, Sheltered Homelessness and Marital Discord needs Support, Education, Resources, Referrals and Care Coordination in order to meet unmet mental health and housing needs. Clinical Goal(s):  Patient will work with LCSW to reduce and manage symptoms of Anxiety, Depression, Post-Traumatic Stress Disorder, Chronic Pain, Sheltered Homelessness and Marital Discord. Patient will increase knowledge and/or ability of: Coping Skills, Healthy Habits, Self-Management Skills, Stress Reduction, Home Safety and Utilization of [employer] and Resources. Clinical Interventions: Problem Solving Solutions reviewed, Verbalization of Feelings encouraged, Active Listening utilized, Emotional Support provided, Psychotropic Medications reviewed and Compliance emphasized, and Cognitive Behavioral Therapy performed. Collaboration with Primary Care Physician, Dr. Brent Bulla regarding development and update of comprehensive plan of care as evidenced by provider attestation and co-signature. Inter-disciplinary care team collaboration (see longitudinal plan of care). Patient Goals/Self-Care Activities: Continue to receive personal counseling with LCSW, on a bi-weekly basis, to reduce and manage symptoms of Anxiety, Depression, Post-Traumatic Stress Disorder and Marital Discord, until well-managed. Happy to hear that you are a candidate to undergo a Robotic Fundoplication and Partial Stomach Wrap, as this will  hopefully provide you with a much better quality of life. Review the following educational material, e-mailed ([email or web address]) to you by LCSW on 05/19/2021, and be prepared to discuss during our next scheduled telephone outreach call: ~ Living With an Alcoholic: How to Deal With an Alcoholic Spouse. ~ How to Help an Alcoholic Husband. Contact LCSW directly (# [phone]) if you have questions, need  assistance, or if additional social work needs are identified.     Follow-Up Date:  06/06/2021 at 10:00 am    Danford BadJoanna Remona Boom LCSW Licensed Clinical Social Worker [facility] [phone]

## 2021-05-24 ENCOUNTER — Ambulatory Visit (AMBULATORY_SURGERY_CENTER): Payer: Medicare Other | Admitting: Gastroenterology

## 2021-05-24 ENCOUNTER — Encounter: Payer: Self-pay | Admitting: Gastroenterology

## 2021-05-24 VITALS — BP 138/73 | HR 58 | Temp 98.0°F | Resp 15 | Ht 62.0 in | Wt 208.0 lb

## 2021-05-24 DIAGNOSIS — K2970 Gastritis, unspecified, without bleeding: Secondary | ICD-10-CM

## 2021-05-24 DIAGNOSIS — R6881 Early satiety: Secondary | ICD-10-CM

## 2021-05-24 DIAGNOSIS — K449 Diaphragmatic hernia without obstruction or gangrene: Secondary | ICD-10-CM | POA: Diagnosis not present

## 2021-05-24 DIAGNOSIS — K2950 Unspecified chronic gastritis without bleeding: Secondary | ICD-10-CM | POA: Diagnosis not present

## 2021-05-24 DIAGNOSIS — K319 Disease of stomach and duodenum, unspecified: Secondary | ICD-10-CM

## 2021-05-24 DIAGNOSIS — K222 Esophageal obstruction: Secondary | ICD-10-CM

## 2021-05-24 DIAGNOSIS — R933 Abnormal findings on diagnostic imaging of other parts of digestive tract: Secondary | ICD-10-CM

## 2021-05-24 DIAGNOSIS — R112 Nausea with vomiting, unspecified: Secondary | ICD-10-CM

## 2021-05-24 MED ORDER — SODIUM CHLORIDE 0.9 % IV SOLN: 500.0000 mL | Freq: Once | INTRAVENOUS | Status: DC

## 2021-05-24 NOTE — Progress Notes (Signed)
Called to room to assist during endoscopic procedure.  Patient ID and intended procedure confirmed with present staff. Received instructions for my participation in the procedure from the performing physician.  

## 2021-05-24 NOTE — Progress Notes (Signed)
To pacu, VSS. Report to [email or web address] 

## 2021-05-24 NOTE — Progress Notes (Signed)
Pt's states no medical or surgical changes since previsit or office visit. VS assessed by C.W 

## 2021-05-24 NOTE — Patient Instructions (Signed)
Handout provided on hiatal hernia, anti-reflux measures, full liquid and soft diets.   Continue present medications including omeprazole 20mg  by mouth twice daily.   Follow anti-reflux measures.   Full liquid to Soft diet until surgery (see handouts).   Follow up with Dr. Johney Maine.   YOU HAD AN ENDOSCOPIC PROCEDURE TODAY AT [facility]:   Refer to the procedure report that was given to you for any specific questions about what was found during the examination.  If the procedure report does not answer your questions, please call your gastroenterologist to clarify.  If you requested that your care partner not be given the details of your procedure findings, then the procedure report has been included in a sealed envelope for you to review at your convenience later.  YOU SHOULD EXPECT: Some feelings of bloating in the abdomen. Passage of more gas than usual.  Walking can help get rid of the air that was put into your GI tract during the procedure and reduce the bloating. If you had a lower endoscopy (such as a colonoscopy or flexible sigmoidoscopy) you may notice spotting of blood in your stool or on the toilet paper. If you underwent a bowel prep for your procedure, you may not have a normal bowel movement for a few days.  Please Note:  You might notice some irritation and congestion in your nose or some drainage.  This is from the oxygen used during your procedure.  There is no need for concern and it should clear up in a day or so.  SYMPTOMS TO REPORT IMMEDIATELY:  Following upper endoscopy (EGD)  Vomiting of blood or coffee ground material  New chest pain or pain under the shoulder blades  Painful or persistently difficult swallowing  New shortness of breath  Fever of 100F or higher  Black, tarry-looking stools  For urgent or emergent issues, a gastroenterologist can be reached at any hour by calling [phone]. Do not use MyChart messaging for urgent concerns.     DIET:  We do recommend a small meal at first, but then you may proceed to your regular diet.  Drink plenty of fluids but you should avoid alcoholic beverages for 24 hours.  ACTIVITY:  You should plan to take it easy for the rest of today and you should NOT DRIVE or use heavy machinery until tomorrow (because of the sedation medicines used during the test).    FOLLOW UP: Our staff will call the number listed on your records 48-72 hours following your procedure to check on you and address any questions or concerns that you may have regarding the information given to you following your procedure. If we do not reach you, we will leave a message.  We will attempt to reach you two times.  During this call, we will ask if you have developed any symptoms of COVID 19. If you develop any symptoms (ie: fever, flu-like symptoms, shortness of breath, cough etc.) before then, please call [phone].  If you test positive for Covid 19 in the 2 weeks post procedure, please call and report this information to [place].    If any biopsies were taken you will be contacted by phone or by letter within the next 1-3 weeks.  Please call us at [phone] if you have not heard about the biopsies in 3 weeks.    SIGNATURES/CONFIDENTIALITY: You and/or your care partner have signed paperwork which will be entered into your electronic medical record.  These signatures attest to  the fact that that the information above on your After Visit Summary has been reviewed and is understood.  Full responsibility of the confidentiality of this discharge information lies with you and/or your care-partner.

## 2021-05-24 NOTE — Progress Notes (Signed)
See 05/17/2021 H&P, no changes.  °

## 2021-05-24 NOTE — Op Note (Addendum)
[facility] Patient Name: Kayde Arana Procedure Date: 05/24/2021 3:08 PM MRN: [id number] Endoscopist: Ladene Artist , MD Age: 76 Referring MD:  Date of Birth: Nov 02, 1945 Gender: Female Account #: [id number] Procedure:                Upper GI endoscopy Indications:              Abnormal UGI series, Early satiety, Nausea with                            vomiting Medicines:                Monitored Anesthesia Care Procedure:                Pre-Anesthesia Assessment:                           - Prior to the procedure, a History and Physical                            was performed, and patient medications and                            allergies were reviewed. The patient's tolerance of                            previous anesthesia was also reviewed. The risks                            and benefits of the procedure and the sedation                            options and risks were discussed with the patient.                            All questions were answered, and informed consent                            was obtained. Prior Anticoagulants: The patient has                            taken no previous anticoagulant or antiplatelet                            agents. ASA Grade Assessment: III - A patient with                            severe systemic disease. After reviewing the risks                            and benefits, the patient was deemed in                            satisfactory condition to undergo the procedure.  After obtaining informed consent, the endoscope was                            passed under direct vision. Throughout the                            procedure, the patient's blood pressure, pulse, and                            oxygen saturations were monitored continuously. The                            Endoscope was introduced through the mouth, and                            advanced to the second part of duodenum. The  upper                            GI endoscopy was accomplished without difficulty.                            The patient tolerated the procedure well. Scope In: Scope Out: Findings:                 One benign-appearing, intrinsic mild stenosis was                            found 33 cm from the incisors. This stenosis                            measured 1.3 cm (inner diameter) x less than one cm                            (in length). The stenosis was traversed. Without                            clear complaints of dysphagia and with                            significantly altered anatomy a dilation was not                            performed.                           The exam of the esophagus was otherwise normal.                           Diffuse mildly erythematous mucosa without bleeding                            was found in the entire examined stomach. Biopsies  were taken with a cold forceps for histology.                           A large hiatal hernia was present.                           Gastric volvulus, partial rotation with a partial                            obstruction, was present in the gastric body.                           The exam of the stomach was otherwise normal.                           The duodenal bulb and second portion of the                            duodenum were normal. Complications:            No immediate complications. Estimated Blood Loss:     Estimated blood loss was minimal. Impression:               - Benign-appearing esophageal stenosis.                           - Erythematous mucosa in the stomach. Biopsied.                           - Large hiatal hernia.                           - Gastric volvulus, partial rotation with a partial                            obstruction.                           - Normal duodenal bulb and second portion of the                            duodenum. Recommendation:            - Patient has a contact number available for                            emergencies. The signs and symptoms of potential                            delayed complications were discussed with the                            patient. Return to normal activities tomorrow.                            Written discharge instructions were provided to the  patient.                           - Full liquid to soft diet until surgery.                           - Follow antireflux measures.                           - Continue present medications including omeprazole                            20 mg po bid.                           - Await pathology results.                           - Follow up with Dr. Johney Maine. Ladene Artist, MD 05/24/2021 3:52:35 PM This report has been signed electronically.

## 2021-05-25 ENCOUNTER — Telehealth: Payer: Self-pay | Admitting: Gastroenterology

## 2021-05-25 NOTE — Telephone Encounter (Signed)
Dr. Gordy SaversGross's consult note indicates he wants an esophageal manometry. Please schedule with Dr. Michaell CowingGross as referring physician and Dr. Lavon PaganiniNandigam as reading physician.

## 2021-05-25 NOTE — Telephone Encounter (Signed)
Dr. Russella Dar, does she need a mano?

## 2021-05-25 NOTE — Telephone Encounter (Signed)
Patient stated that she had a EGD done yesterday with Dr. Russella DarStark. He advised her to talk with Dr. Michaell CowingGross about Manometry and surgery date.   Patient stated that she spoke with Dr. Erin HearingGrosses office and they stated to get back in touch with us.  Seeking advice, please advise.

## 2021-05-26 ENCOUNTER — Telehealth: Payer: Self-pay

## 2021-05-26 NOTE — Telephone Encounter (Signed)
°  Follow up Call-  Call back number 05/24/2021  Post procedure Call Back phone  # [phone]  Permission to leave phone message Yes  Some recent data might be hidden     Patient questions:  Do you have a fever, pain , or abdominal swelling? No. Pain Score  0 *  Have you tolerated food without any problems? Yes.    Have you been able to return to your normal activities? Yes.    Do you have any questions about your discharge instructions: Diet   No. Medications  No. Follow up visit  No.  Do you have questions or concerns about your Care? No.  Actions: * If pain score is 4 or above: No action needed, pain <4.  Have you developed a fever since your procedure? no  2.   Have you had an respiratory symptoms (SOB or cough) since your procedure? no  3.   Have you tested positive for COVID 19 since your procedure no  4.   Have you had any family members/close contacts diagnosed with the COVID 19 since your procedure?  no   If yes to any of these questions please route to Laverna Peace, RN and Karlton Lemon, RN

## 2021-05-26 NOTE — Telephone Encounter (Signed)
Has been scheduled for mano for 07/01/21 at 10:30.  Instructions mailed to patient .  She will call if she has any questions once she receives them

## 2021-05-31 ENCOUNTER — Ambulatory Visit: Payer: Medicare Other | Admitting: *Deleted

## 2021-05-31 DIAGNOSIS — E782 Mixed hyperlipidemia: Secondary | ICD-10-CM

## 2021-05-31 DIAGNOSIS — F321 Major depressive disorder, single episode, moderate: Secondary | ICD-10-CM

## 2021-05-31 DIAGNOSIS — F331 Major depressive disorder, recurrent, moderate: Secondary | ICD-10-CM

## 2021-05-31 DIAGNOSIS — R269 Unspecified abnormalities of gait and mobility: Secondary | ICD-10-CM

## 2021-05-31 DIAGNOSIS — Q245 Malformation of coronary vessels: Secondary | ICD-10-CM

## 2021-05-31 DIAGNOSIS — R413 Other amnesia: Secondary | ICD-10-CM

## 2021-05-31 DIAGNOSIS — I25110 Atherosclerotic heart disease of native coronary artery with unstable angina pectoris: Secondary | ICD-10-CM

## 2021-05-31 DIAGNOSIS — E669 Obesity, unspecified: Secondary | ICD-10-CM

## 2021-05-31 DIAGNOSIS — R5383 Other fatigue: Secondary | ICD-10-CM

## 2021-05-31 DIAGNOSIS — R5381 Other malaise: Secondary | ICD-10-CM

## 2021-05-31 NOTE — Chronic Care Management (AMB) (Signed)
Chronic Care Management    Clinical Social Work Note  05/31/2021 Name: Deborah Farley MRN: [id number] DOB: Dec 25, 1945  Deborah Farley is a 76 y.o. year old female who is a primary care patient of Marina Goodell Mickle Mallory, MD. The CCM team was consulted to assist the patient with chronic disease management and/or care coordination needs related to: [employer], Mental Health Counseling and Resources, and Caregiver Stress.   Engaged with patient by telephone for follow up visit in response to provider referral for social work chronic care management and care coordination services.   Consent to Services:  The patient was given information about Chronic Care Management services, agreed to services, and gave verbal consent prior to initiation of services.  Please see initial visit note for detailed documentation.   Patient agreed to services and consent obtained.   Assessment: Review of patient past medical history, allergies, medications, and health status, including review of relevant consultants reports was performed today as part of a comprehensive evaluation and provision of chronic care management and care coordination services.     SDOH (Social Determinants of Health) assessments and interventions performed:    Advanced Directives Status: Not addressed in this encounter.  CCM Care Plan  Allergies  Allergen Reactions   Codeine Anaphylaxis   Iodine Anaphylaxis   Shellfish Allergy Anaphylaxis         Outpatient Encounter Medications as of 05/31/2021  Medication Sig   ALPRAZolam (XANAX) 0.5 MG tablet Take 0.25 mg by mouth as needed for anxiety. (Patient not taking: Reported on 05/24/2021)   ARIPiprazole (ABILIFY) 2 MG tablet Take 1 tablet (2 mg total) by mouth daily.   aspirin EC 81 MG tablet Take 81 mg by mouth daily.   bumetanide (BUMEX) 1 MG tablet Take 1 mg by mouth daily as needed for edema (swelling).   buPROPion (WELLBUTRIN XL) 300 MG 24 hr tablet Take 1 tablet (300 mg  total) by mouth at bedtime.   Cholecalciferol (VITAMIN D) 2000 units CAPS Take 2,000 Units by mouth daily.   diltiazem (CARDIZEM CD) 120 MG 24 hr capsule Take 1 capsule (120 mg total) by mouth daily.   folic acid (FOLVITE) 1 MG tablet Take 1 tablet (1 mg total) by mouth daily.   isosorbide mononitrate (IMDUR) 30 MG 24 hr tablet Take 30 mg by mouth daily.   latanoprost (XALATAN) 0.005 % ophthalmic solution Place 1 drop into both eyes at bedtime.   levothyroxine (SYNTHROID) 25 MCG tablet Take 1 tablet (25 mcg total) by mouth daily before breakfast.   methotrexate (RHEUMATREX) 2.5 MG tablet Take 25 mg by mouth once a week. Caution:Chemotherapy. Protect from light.  Takes 5 tablets in the am and 5 tablets in the pm once a week on Sundays   metoprolol tartrate (LOPRESSOR) 25 MG tablet Take 1 tablet (25 mg total) by mouth 2 (two) times daily.   Multiple Vitamin (MULTIVITAMIN) capsule Take 1 capsule by mouth daily.   nitroGLYCERIN (NITROSTAT) 0.4 MG SL tablet Place 1 tablet (0.4 mg total) under the tongue every 5 (five) minutes as needed for chest pain. (Patient not taking: Reported on 05/24/2021)   omeprazole (PRILOSEC) 20 MG capsule Take 1 capsule (20 mg total) by mouth in the morning and at bedtime.   polyethylene glycol (MIRALAX / GLYCOLAX) packet Take 17 g by mouth daily as needed for mild constipation.    pravastatin (PRAVACHOL) 20 MG tablet Take 20 mg by mouth daily.   sertraline (ZOLOFT) 100 MG tablet Take 50 mg by mouth  daily.   traMADol (ULTRAM) 50 MG tablet Take 1 tablet (50 mg total) by mouth every 6 (six) hours as needed.   traZODone (DESYREL) 50 MG tablet Take 1 tablet (50 mg total) by mouth at bedtime.   Vitamin D, Ergocalciferol, (DRISDOL) 50000 units CAPS capsule Take 50,000 Units by mouth every Sunday.    No facility-administered encounter medications on file as of 05/31/2021.    Patient Active Problem List   Diagnosis Date Noted   Degeneration of lumbar intervertebral disc  03/02/2021   Depression 02/17/2021   Chronic pain syndrome 11/30/2020   PTSD (post-traumatic stress disorder) 11/30/2020   Depression, major, single episode, moderate (HCC) 08/31/2020   Obesity, diabetes, and hypertension syndrome (HCC) 07/29/2020   Hernia, hiatal    High cholesterol    Memory difficulty    Spastic esophagus    Acute sinusitis 03/19/2020   Suspected pulmonary embolism 03/11/2019   Dyspnea on exertion 01/16/2019   Gait disturbance 03/15/2017   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   History of diabetes mellitus 05/31/2016   History of hypothyroidism 05/31/2016   History of gastroesophageal reflux (GERD) 05/31/2016   Rheumatoid arthritis (HCC) 04/12/2016   High risk medication use 04/12/2016   Heart disease 03/01/2016   Hyperlipidemia 02/29/2016   Hypertension 02/29/2016   Hypothyroidism 02/29/2016   Obesity (BMI 35.0-39.9 without comorbidity) 02/29/2016   Acid reflux 02/29/2016   Progressive angina (HCC) 08/11/2015   Diet-controlled diabetes mellitus (HCC) 08/11/2015   Chronic idiopathic constipation 07/02/2015   Chronic systolic congestive heart failure (HCC) 07/02/2015   Hiatal hernia 07/02/2015   Malaise and fatigue 07/02/2015   Major depressive disorder, recurrent episode, moderate (HCC) 07/02/2015   Coronary artery disease involving native coronary artery of native heart with unstable angina pectoris (HCC) 07/02/2015   Epigastric pain 04/08/2015    Conditions to be addressed/monitored: Anxiety and Depression.  Corporate treasurer, Limited Social Support, Housing Barriers, Mental Health Concerns, Family and Relationship Dysfunction, Social Isolation, Limited Access to Engineer, structural, Memory Deficits, and Lacks Knowledge of [employer].  Care Plan : LCSW Plan of Care  Updates made by [name], LCSW since 05/31/2021 12:00 AM     Problem: Improve My Quality of Life.   Priority: High     Long-Range Goal: Improve My Quality of Life.   Start  Date: 12/29/2020  Expected End Date: 07/29/2021  This Visit's Progress: On track  Recent Progress: On track  Priority: High  Note:   Current Barriers:   Acute Mental Health needs related to Anxiety, Depression, Post-Traumatic Stress Disorder, Chronic Pain, Sheltered Homelessness and Marital Discord needs Support, Education, Resources, Referrals and Care Coordination in order to meet unmet mental health and housing needs. Clinical Goal(s):  Patient will work with LCSW to reduce and manage symptoms of Anxiety, Depression, Post-Traumatic Stress Disorder, Chronic Pain, Sheltered Homelessness and Marital Discord. Patient will increase knowledge and/or ability of: Coping Skills, Healthy Habits, Self-Management Skills, Stress Reduction, Home Safety and Utilization of [employer] and Resources. Clinical Interventions: Problem Solving Solutions reviewed, Verbalization of Feelings encouraged, Active Listening utilized, Emotional Support provided, Psychotropic Medications reviewed and Compliance emphasized, and Cognitive Behavioral Therapy performed. Collaboration with Primary Care Physician, Dr. Brent Bulla regarding development and update of comprehensive plan of care as evidenced by provider attestation and co-signature. Inter-disciplinary care team collaboration (see longitudinal plan of care). Patient Goals/Self-Care Activities: Continue to receive personal counseling with LCSW, on a bi-weekly basis, to reduce and manage symptoms of Anxiety, Depression, Post-Traumatic Stress Disorder and Marital Discord, until well-managed.  Happy to hear that you are a candidate to undergo a Robotic Fundoplication and Partial Stomach Wrap, as this will hopefully provide you with a much better quality of life. Review the following educational material, e-mailed ([email or web address]) to you by LCSW on 05/19/2021, and be prepared to discuss during our next scheduled telephone outreach call: ~ Living With an Alcoholic:  How to Deal With an Alcoholic Spouse. ~ How to Help an Alcoholic Husband. Contact LCSW directly if you have questions, need assistance, or if additional social work needs are identified.     Follow-Up Date:  06/16/2021 at 2:00 pm with newly Embedded LCSW at [facility], Reece Levy.    Danford Bad LCSW Licensed Clinical Social Worker [facility] [phone]

## 2021-05-31 NOTE — Patient Instructions (Signed)
Visit Information  Thank you for taking time to visit with me today. Please don't hesitate to contact me if I can be of assistance to you before our next scheduled telephone appointment.  Following are the goals we discussed today:  Patient Goals/Self-Care Activities: Continue to receive personal counseling with LCSW, on a bi-weekly basis, to reduce and manage symptoms of Anxiety, Depression, Post-Traumatic Stress Disorder and Marital Discord, until well-managed. Happy to hear that you are a candidate to undergo a Robotic Fundoplication and Partial Stomach Wrap, as this will hopefully provide you with a much better quality of life. Review the following educational material, e-mailed ([email or web address]) to you by LCSW on 05/19/2021, and be prepared to discuss during our next scheduled telephone outreach call: ~ Living With an Alcoholic: How to Deal With an Alcoholic Spouse. ~ How to Help an Alcoholic Husband. Contact LCSW directly if you have questions, need assistance, or if additional social work needs are identified.     Follow-Up Date:  06/16/2021 at 2:00 pm with newly Embedded LCSW at [facility], Reece Levy.  Please call the care guide team at [phone] if you need to cancel or reschedule your appointment.   If you are experiencing a Mental Health or Behavioral Health Crisis or need someone to talk to, please call the Suicide and Crisis Lifeline: 988 call the [place] National Suicide Prevention Lifeline: (531)588-5150 or TTY: (475) 310-8798 TTY 513-776-1658) to talk to a trained counselor call 1-800-273-TALK (toll free, 24 hour hotline) go to [facility] [address] [place] [phone]) call the [facility] Crisis Line: [phone] call 911   Patient verbalizes understanding of instructions and care plan provided today and agrees to view in MyChart. Active MyChart status confirmed with patient.    Danford Bad  LCSW Licensed Clinical Social Worker [facility] [phone]

## 2021-06-01 ENCOUNTER — Encounter: Payer: Self-pay | Admitting: Gastroenterology

## 2021-06-06 ENCOUNTER — Telehealth: Payer: Medicare Other

## 2021-06-07 DIAGNOSIS — I152 Hypertension secondary to endocrine disorders: Secondary | ICD-10-CM | POA: Diagnosis not present

## 2021-06-07 DIAGNOSIS — E1159 Type 2 diabetes mellitus with other circulatory complications: Secondary | ICD-10-CM | POA: Diagnosis not present

## 2021-06-07 DIAGNOSIS — I200 Unstable angina: Secondary | ICD-10-CM | POA: Diagnosis not present

## 2021-06-07 DIAGNOSIS — E782 Mixed hyperlipidemia: Secondary | ICD-10-CM | POA: Diagnosis not present

## 2021-06-07 DIAGNOSIS — I25110 Atherosclerotic heart disease of native coronary artery with unstable angina pectoris: Secondary | ICD-10-CM | POA: Diagnosis not present

## 2021-06-07 DIAGNOSIS — E1169 Type 2 diabetes mellitus with other specified complication: Secondary | ICD-10-CM | POA: Diagnosis not present

## 2021-06-07 DIAGNOSIS — F331 Major depressive disorder, recurrent, moderate: Secondary | ICD-10-CM

## 2021-06-07 DIAGNOSIS — F321 Major depressive disorder, single episode, moderate: Secondary | ICD-10-CM

## 2021-06-07 DIAGNOSIS — E669 Obesity, unspecified: Secondary | ICD-10-CM

## 2021-06-16 ENCOUNTER — Ambulatory Visit (INDEPENDENT_AMBULATORY_CARE_PROVIDER_SITE_OTHER): Payer: Medicare Other | Admitting: *Deleted

## 2021-06-16 DIAGNOSIS — I25110 Atherosclerotic heart disease of native coronary artery with unstable angina pectoris: Secondary | ICD-10-CM

## 2021-06-16 DIAGNOSIS — F32A Depression, unspecified: Secondary | ICD-10-CM

## 2021-06-16 DIAGNOSIS — E119 Type 2 diabetes mellitus without complications: Secondary | ICD-10-CM

## 2021-06-17 NOTE — Patient Instructions (Signed)
Visit Information  Thank you for taking time to visit with me today. Please don't hesitate to contact me if I can be of assistance to you before our next scheduled telephone appointment.   [facility]     Please call the care guide team at [phone] if you need to cancel or reschedule your appointment.   If you are experiencing a Mental Health or Behavioral Health Crisis or need someone to talk to, please call the [place] National Suicide Prevention Lifeline: (701)830-35111-781-825-1386 or TTY: (705) 773-41101-800-799-4 TTY 671-137-8894(1-7435894158) to talk to a trained counselor call 1-800-273-TALK (toll free, 24 hour hotline) call 911   Patient verbalizes understanding of instructions and care plan provided today and agrees to view in MyChart. Active MyChart status confirmed with patient.    Reece LevyJanet Seaver Machia MSW, LCSW Licensed Clinical Social Worker [facility]   [phone]
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
## 2021-06-21 NOTE — Progress Notes (Addendum)
Anesthesia Review:  PCP: DR Brent Bullalawrence Perry  Cardiologist : DR REvankar clearance 05/17/21 in epic per Edd FabianJesse Cleaver  LOV 03/02/21  Chest x-ray :03/10/21- 2v EKG :03/02/21  Echo : 2020  Stress test: Cardiac Cath :  2021  Activity level: cannot do a flgiht of staris wtihout difficulty  Sleep Study/ CPAP : none  Fasting Blood Sugar :      / Checks Blood Sugar -- times a day:   Blood Thinner/ Instructions /Last Dose: ASA / Instructions/ Last Dose :   81 mg aspirin  Covid test on 07/12/21 - at 1200 noon  Prediabetes- checks glucose on occasion at home  Prediabetes- hgba1c- 06/23/21-  5.3  Glucose was 87 at preop appt . Pt had not eaten lunch per pt.  Pt given peanut butter crackers and water.  PT to eat lunch once completes preop appt per pt .

## 2021-06-21 NOTE — Progress Notes (Signed)
Covid  test on 07/12/21.   Come thru main entrance at [facility] long have a seat in the lobby on the right as you come thru the door.  Call [phone] and let them know you are here for covid testing .                  Jyll Monroe  06/21/2021   Your procedure is scheduled on:      07/15/21.   Report to [facility] Main  Entrance   Report to admitting at   [phone]     Call this number if you have problems the morning of surgery [phone]           REMEMBER:  DRINK 2 [place] AT 1000 PM AND 1 PRESURGERY DRINK THE DAY OF THE PROCEDURE 3 HOURS PRIOR TO SCHEDULED SURGERY.  NOTHING BY MOUTH EXCEPT CLEAR LIQUIDS UNTIL THREE HOURS PRIOR TO SCHEDULED SURGERY. PLEASE FINISH PRESURGERY 3RD  ENSURE  DRINK PER SURGEON ORDER 3 HOURS PRIOR TO SCHEDULED SURGERY TIME WHICH NEEDS TO BE COMPLETED AT _____ 0430am ____.     CLEAR LIQUID DIET   Foods Allowed                                                                      Coffee and tea, regular and decaf                           Plain Jell-O any favor except red or purple                                         Fruit ices (not with fruit pulp)                                      Iced Popsicles                                     Carbonated beverages, regular and diet                                    Cranberry, grape and apple juices Sports drinks like Gatorade Lightly seasoned clear broth or consume(fat free) Sugar  _____________________________________________________________________      BRUSH YOUR TEETH MORNING OF SURGERY AND RINSE YOUR MOUTH OUT, NO CHEWING GUM CANDY OR MINTS.     Take these medicines the morning of surgery with A SIP OF WATER:  imdur, abilify, cardizem, synthroid, metoprolol, prilosec zoloft   DO NOT TAKE ANY DIABETIC MEDICATIONS DAY OF YOUR SURGERY                               You may not have any metal on your body including hair pins and  piercings  Do not wear jewelry, make-up, lotions, powders or perfumes, deodorant             Do not wear nail polish on your fingernails.  Do not shave  48 hours prior to surgery.              Men may shave face and neck.   Do not bring valuables to the hospital. [place].  Contacts, dentures or bridgework may not be worn into surgery.  Leave suitcase in the car. After surgery it may be brought to your room.                 Please read over the following fact sheets you were given: _____________________________________________________________________  [facility] - Preparing for Surgery Before surgery, you can play an important role.  Because skin is not sterile, your skin needs to be as free of germs as possible.  You can reduce the number of germs on your skin by washing with CHG (chlorahexidine gluconate) soap before surgery.  CHG is an antiseptic cleaner which kills germs and bonds with the skin to continue killing germs even after washing. Please DO NOT use if you have an allergy to CHG or antibacterial soaps.  If your skin becomes reddened/irritated stop using the CHG and inform your nurse when you arrive at Short Stay. Do not shave (including legs and underarms) for at least 48 hours prior to the first CHG shower.  You may shave your face/neck. Please follow these instructions carefully:  1.  Shower with CHG Soap the night before surgery and the  morning of Surgery.  2.  If you choose to wash your hair, wash your hair first as usual with your  normal  shampoo.  3.  After you shampoo, rinse your hair and body thoroughly to remove the  shampoo.                           4.  Use CHG as you would any other liquid soap.  You can apply chg directly  to the skin and wash                       Gently with a scrungie or clean washcloth.  5.  Apply the CHG Soap to your body ONLY FROM THE NECK DOWN.   Do not use on face/ open                            Wound or open sores. Avoid contact with eyes, ears mouth and genitals (private parts).                       Wash face,  Genitals (private parts) with your normal soap.             6.  Wash thoroughly, paying special attention to the area where your surgery  will be performed.  7.  Thoroughly rinse your body with warm water from the neck down.  8.  DO NOT shower/wash with your normal soap after using and rinsing off  the CHG Soap.                9.  Pat yourself dry with a clean towel.  10.  Wear clean pajamas.            11.  Place clean sheets on your bed the night of your first shower and do not  sleep with pets. Day of Surgery : Do not apply any lotions/deodorants the morning of surgery.  Please wear clean clothes to the hospital/surgery center.  FAILURE TO FOLLOW THESE INSTRUCTIONS MAY RESULT IN THE CANCELLATION OF YOUR SURGERY PATIENT SIGNATURE_________________________________  NURSE SIGNATURE__________________________________  ________________________________________________________________________

## 2021-06-23 ENCOUNTER — Encounter (HOSPITAL_COMMUNITY): Payer: Self-pay | Admitting: Gastroenterology

## 2021-06-23 ENCOUNTER — Other Ambulatory Visit: Payer: Self-pay

## 2021-06-23 ENCOUNTER — Telehealth: Payer: Medicare Other

## 2021-06-23 ENCOUNTER — Encounter (HOSPITAL_COMMUNITY)
Admission: RE | Admit: 2021-06-23 | Discharge: 2021-06-23 | Disposition: A | Discharge status: Home / Self Care | Payer: Medicare Other | Source: Ambulatory Visit | Attending: Surgery | Admitting: Surgery

## 2021-06-23 ENCOUNTER — Ambulatory Visit: Payer: Medicare Other | Admitting: *Deleted

## 2021-06-23 ENCOUNTER — Telehealth: Payer: Self-pay

## 2021-06-23 ENCOUNTER — Encounter (HOSPITAL_COMMUNITY): Payer: Self-pay

## 2021-06-23 VITALS — BP 128/92 | HR 67 | Temp 98.4°F | Resp 16 | Ht 63.0 in | Wt 197.0 lb

## 2021-06-23 DIAGNOSIS — Z01812 Encounter for preprocedural laboratory examination: Secondary | ICD-10-CM | POA: Insufficient documentation

## 2021-06-23 DIAGNOSIS — E119 Type 2 diabetes mellitus without complications: Secondary | ICD-10-CM

## 2021-06-23 DIAGNOSIS — Z01818 Encounter for other preprocedural examination: Secondary | ICD-10-CM

## 2021-06-23 DIAGNOSIS — I5022 Chronic systolic (congestive) heart failure: Secondary | ICD-10-CM | POA: Diagnosis not present

## 2021-06-23 DIAGNOSIS — Z658 Other specified problems related to psychosocial circumstances: Secondary | ICD-10-CM

## 2021-06-23 DIAGNOSIS — R269 Unspecified abnormalities of gait and mobility: Secondary | ICD-10-CM

## 2021-06-23 DIAGNOSIS — R7303 Prediabetes: Secondary | ICD-10-CM | POA: Insufficient documentation

## 2021-06-23 DIAGNOSIS — F32A Depression, unspecified: Secondary | ICD-10-CM

## 2021-06-23 HISTORY — DX: Prediabetes: R73.03

## 2021-06-23 LAB — CBC
HCT: 45.7 % (ref 36.0–46.0)
Hemoglobin: 15.3 g/dL — ABNORMAL HIGH (ref 12.0–15.0)
MCH: 33.2 pg (ref 26.0–34.0)
MCHC: 33.5 g/dL (ref 30.0–36.0)
MCV: 99.1 fL (ref 80.0–100.0)
Platelets: 200 10*3/uL (ref 150–400)
RBC: 4.61 MIL/uL (ref 3.87–5.11)
RDW: 14.8 % (ref 11.5–15.5)
WBC: 8 10*3/uL (ref 4.0–10.5)
nRBC: 0.3 % — ABNORMAL HIGH (ref 0.0–0.2)

## 2021-06-23 LAB — BASIC METABOLIC PANEL
Anion gap: 4 — ABNORMAL LOW (ref 5–15)
BUN: 11 mg/dL (ref 8–23)
CO2: 27 mmol/L (ref 22–32)
Calcium: 9.6 mg/dL (ref 8.9–10.3)
Chloride: 110 mmol/L (ref 98–111)
Creatinine, Ser: 0.9 mg/dL (ref 0.44–1.00)
GFR, Estimated: >60 mL/min (ref >60)
Glucose, Bld: 95 mg/dL (ref 70–99)
Potassium: 4.6 mmol/L (ref 3.5–5.1)
Sodium: 141 mmol/L (ref 135–145)

## 2021-06-23 LAB — HEMOGLOBIN A1C
Hgb A1c MFr Bld: 5.3 % (ref 4.8–5.6)
Mean Plasma Glucose: 105.41 mg/dL

## 2021-06-23 LAB — GLUCOSE, CAPILLARY: Glucose-Capillary: 87 mg/dL (ref 70–99)

## 2021-06-23 NOTE — Patient Instructions (Signed)
Visit Information  Thank you for taking time to visit with me today. Please don't hesitate to contact me if I can be of assistance to you before our next scheduled telephone appointment.  Following are the goals we discussed today:  (Copy and paste patient goals from clinical care plan here)  Our next appointment is by telephone on 06/30/21 at 9am  Please call the care guide team at [phone] if you need to cancel or reschedule your appointment.   If you are experiencing a Mental Health or Behavioral Health Crisis or need someone to talk to, please call the Suicide and Crisis Lifeline: 988 call the [place] National Suicide Prevention Lifeline: 870-625-84011-906-313-0911 or TTY: (249)830-49511-800-799-4 TTY 207-207-7572(1-224-524-4535) to talk to a trained counselor call 1-800-273-TALK (toll free, 24 hour hotline) go to [facility] [address], [place] [phone]) call 911   Patient verbalizes understanding of instructions and care plan provided today and agrees to view in MyChart. Active MyChart status confirmed with patient.    Reece LevyJanet Timmi Devora MSW, LCSW Licensed Clinical Social Worker [facility]   [phone]

## 2021-06-23 NOTE — Progress Notes (Signed)
Attempted to obtain medical history via telephone, unable to reach at this time. I left a voicemail to return pre surgical testing department's phone call.  
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
## 2021-06-23 NOTE — Telephone Encounter (Signed)
 preservice center called & stated CPT code was incorrect. New CPT code provided, and has been changed by hospital schedulers.

## 2021-06-23 NOTE — Progress Notes (Signed)
Lvmm for pt in regards preop instructions.  PT at time of preop appt was not sure of what medds she takes in am.  Informed pt at time of preop would be calling her back this pm to review those over phone while she had meds in front of her. Called pt and LVMM . For her to call nurse back at [phone].

## 2021-06-24 ENCOUNTER — Other Ambulatory Visit: Payer: Self-pay

## 2021-06-24 ENCOUNTER — Other Ambulatory Visit: Payer: Self-pay | Admitting: Legal Medicine

## 2021-06-24 DIAGNOSIS — I10 Essential (primary) hypertension: Secondary | ICD-10-CM

## 2021-06-24 DIAGNOSIS — E559 Vitamin D deficiency, unspecified: Secondary | ICD-10-CM

## 2021-06-24 DIAGNOSIS — F332 Major depressive disorder, recurrent severe without psychotic features: Secondary | ICD-10-CM

## 2021-06-24 DIAGNOSIS — I25110 Atherosclerotic heart disease of native coronary artery with unstable angina pectoris: Secondary | ICD-10-CM

## 2021-06-24 MED ORDER — PRAVASTATIN SODIUM 20 MG PO TABS: 20.0000 mg | ORAL_TABLET | Freq: Every day | ORAL | 1 refills | Status: DC

## 2021-06-24 MED ORDER — FOLIC ACID 1 MG PO TABS: 1.0000 mg | ORAL_TABLET | Freq: Every day | ORAL | 1 refills | Status: DC

## 2021-06-24 MED ORDER — SERTRALINE HCL 100 MG PO TABS: 50.0000 mg | ORAL_TABLET | Freq: Every day | ORAL | 1 refills | Status: DC

## 2021-06-24 MED ORDER — ALPRAZOLAM 0.5 MG PO TABS: 0.2500 mg | ORAL_TABLET | Freq: Every day | ORAL | 0 refills | Status: DC | PRN

## 2021-06-24 MED ORDER — POLYETHYLENE GLYCOL 3350 17 G PO PACK: 17.0000 g | PACK | Freq: Every day | ORAL | 3 refills | Status: DC | PRN

## 2021-06-24 MED ORDER — TRAZODONE HCL 50 MG PO TABS: 50.0000 mg | ORAL_TABLET | Freq: Every day | ORAL | 1 refills | Status: DC

## 2021-06-24 MED ORDER — METHOTREXATE 2.5 MG PO TABS: 25.0000 mg | ORAL_TABLET | ORAL | 2 refills | Status: DC

## 2021-06-24 MED ORDER — NITROGLYCERIN 0.4 MG SL SUBL: 0.4000 mg | SUBLINGUAL_TABLET | SUBLINGUAL | 7 refills | Status: AC | PRN

## 2021-06-24 MED ORDER — BUPROPION HCL ER (XL) 300 MG PO TB24: 300.0000 mg | ORAL_TABLET | Freq: Every day | ORAL | 1 refills | Status: DC

## 2021-06-24 MED ORDER — ARIPIPRAZOLE 2 MG PO TABS: 2.0000 mg | ORAL_TABLET | Freq: Every day | ORAL | 1 refills | Status: DC

## 2021-06-24 MED ORDER — BUMETANIDE 1 MG PO TABS: 1.0000 mg | ORAL_TABLET | Freq: Every day | ORAL | 1 refills | Status: DC | PRN

## 2021-06-24 MED ORDER — OMEPRAZOLE 20 MG PO CPDR: 20.0000 mg | DELAYED_RELEASE_CAPSULE | Freq: Two times a day (BID) | ORAL | 2 refills | Status: DC

## 2021-06-24 MED ORDER — DILTIAZEM HCL ER COATED BEADS 120 MG PO CP24: 120.0000 mg | ORAL_CAPSULE | Freq: Every day | ORAL | 3 refills | Status: DC

## 2021-06-24 MED ORDER — VITAMIN D (ERGOCALCIFEROL) 1.25 MG (50000 UNIT) PO CAPS: 50000.0000 [IU] | ORAL_CAPSULE | ORAL | 2 refills | Status: DC

## 2021-06-24 NOTE — Telephone Encounter (Signed)
Refill of Nitroglycerin 0.4 mg sent to [facility].

## 2021-06-26 MED ORDER — ALPRAZOLAM 0.5 MG PO TABS: 0.2500 mg | ORAL_TABLET | Freq: Every day | ORAL | 0 refills | Status: DC | PRN

## 2021-06-27 ENCOUNTER — Other Ambulatory Visit: Payer: Self-pay

## 2021-06-27 ENCOUNTER — Encounter (HOSPITAL_COMMUNITY): Payer: Self-pay | Admitting: Physician Assistant

## 2021-06-27 MED ORDER — METHOTREXATE 2.5 MG PO TABS: 25.0000 mg | ORAL_TABLET | ORAL | 3 refills | Status: DC

## 2021-06-28 ENCOUNTER — Other Ambulatory Visit: Payer: Self-pay

## 2021-06-28 DIAGNOSIS — E039 Hypothyroidism, unspecified: Secondary | ICD-10-CM

## 2021-06-29 ENCOUNTER — Telehealth: Payer: Self-pay | Admitting: Gastroenterology

## 2021-06-29 MED ORDER — METOPROLOL TARTRATE 25 MG PO TABS: 25.0000 mg | ORAL_TABLET | Freq: Two times a day (BID) | ORAL | 2 refills | Status: DC

## 2021-06-29 MED ORDER — LEVOTHYROXINE SODIUM 25 MCG PO TABS: 25.0000 ug | ORAL_TABLET | Freq: Every day | ORAL | 2 refills | Status: DC

## 2021-06-29 MED ORDER — TRAMADOL HCL 50 MG PO TABS: 50.0000 mg | ORAL_TABLET | Freq: Four times a day (QID) | ORAL | 0 refills | Status: DC | PRN

## 2021-06-29 NOTE — Telephone Encounter (Signed)
Spoke with patient & answered questions regarding esophageal manometry on 07/01/21. Verbalized understanding, no further questions.

## 2021-06-29 NOTE — Telephone Encounter (Signed)
Inbound call from patient requesting to speak with a nurse please in regards to upcoming esophageal manometry she is going to have.

## 2021-06-29 NOTE — Progress Notes (Signed)
Reviewed wth pt medds to take am of surgery.  PT is aware to take abilify, cardizem imdur synthroid, metoprolol, omeprazole, and zoloft am of surgery since she normally takes those meds in am.  PT voiced understanding.  AT time of preop pt was unsure of when took certain meds so called her at home to clarify.

## 2021-06-30 ENCOUNTER — Ambulatory Visit: Payer: Medicare Other | Admitting: *Deleted

## 2021-06-30 ENCOUNTER — Other Ambulatory Visit: Payer: Self-pay

## 2021-06-30 DIAGNOSIS — F32A Depression, unspecified: Secondary | ICD-10-CM

## 2021-06-30 DIAGNOSIS — E119 Type 2 diabetes mellitus without complications: Secondary | ICD-10-CM

## 2021-06-30 DIAGNOSIS — E782 Mixed hyperlipidemia: Secondary | ICD-10-CM

## 2021-06-30 DIAGNOSIS — Z658 Other specified problems related to psychosocial circumstances: Secondary | ICD-10-CM

## 2021-07-01 ENCOUNTER — Ambulatory Visit (HOSPITAL_COMMUNITY)
Admission: RE | Admit: 2021-07-01 | Discharge: 2021-07-01 | Disposition: A | Discharge status: Home / Self Care | Payer: Medicare Other | Source: Ambulatory Visit | Attending: Gastroenterology | Admitting: Gastroenterology

## 2021-07-01 ENCOUNTER — Encounter (HOSPITAL_COMMUNITY): Payer: Self-pay | Admitting: Gastroenterology

## 2021-07-01 ENCOUNTER — Telehealth: Payer: Self-pay | Admitting: *Deleted

## 2021-07-01 ENCOUNTER — Other Ambulatory Visit: Payer: Self-pay | Admitting: *Deleted

## 2021-07-01 ENCOUNTER — Encounter (HOSPITAL_COMMUNITY)
Admission: RE | Disposition: A | Discharge status: Home / Self Care | Payer: Self-pay | Source: Ambulatory Visit | Attending: Gastroenterology

## 2021-07-01 DIAGNOSIS — R1310 Dysphagia, unspecified: Secondary | ICD-10-CM | POA: Insufficient documentation

## 2021-07-01 DIAGNOSIS — K219 Gastro-esophageal reflux disease without esophagitis: Secondary | ICD-10-CM

## 2021-07-01 DIAGNOSIS — K449 Diaphragmatic hernia without obstruction or gangrene: Secondary | ICD-10-CM | POA: Diagnosis not present

## 2021-07-01 DIAGNOSIS — Z658 Other specified problems related to psychosocial circumstances: Secondary | ICD-10-CM

## 2021-07-01 DIAGNOSIS — F32A Depression, unspecified: Secondary | ICD-10-CM

## 2021-07-01 HISTORY — PX: ESOPHAGEAL MANOMETRY: SHX5429

## 2021-07-01 SURGERY — MANOMETRY, ESOPHAGUS

## 2021-07-01 MED ORDER — LIDOCAINE VISCOUS HCL 2 % MT SOLN
OROMUCOSAL | Status: AC
  Filled 2021-07-01: qty 15

## 2021-07-01 SURGICAL SUPPLY — 2 items
FACESHIELD LNG OPTICON STERILE (SAFETY) IMPLANT
GLOVE BIO SURGEON STRL SZ8 (GLOVE) ×4 IMPLANT

## 2021-07-01 NOTE — Patient Instructions (Signed)
Visit Information  Thank you for taking time to visit with me today. Please don't hesitate to contact me if I can be of assistance to you before our next scheduled telephone appointment.  Our next appointment is by telephone on 07/07/21      Please call the care guide team at [phone] if you need to cancel or reschedule your appointment.   If you are experiencing a Mental Health or Behavioral Health Crisis or need someone to talk to, please call the Suicide and Crisis Lifeline: 988 call the [place] National Suicide Prevention Lifeline: (863)786-1082 or TTY: 737-012-5006 TTY 708-107-4607) to talk to a trained counselor call 1-800-273-TALK (toll free, 24 hour hotline) call 911   Patient verbalizes understanding of instructions and care plan provided today and agrees to view in MyChart. Active MyChart status confirmed with patient.   Reece Levy MSW, LCSW Licensed Clinical Social Worker [facility]   [phone]

## 2021-07-01 NOTE — Telephone Encounter (Signed)
° °  Telephone encounter was:  Successful.  07/01/2021 Name: Deborah Farley MRN: [id number] DOB: Sep 16, 1945  [name] is a 76 y.o. year old female who is a primary care patient of Abigail Miyamoto, MD . The community resource team was consulted for assistance with Food Insecurity  Care guide performed the following interventions: Patient provided with information about care guide support team and interviewed to confirm resource needs.Talked with patient about food insecurities she is very worried with the cut back on food stamps about her food availability put in a referral to [employer] and emailed her many food banks and senior resources information  Follow Up Plan:  No further follow up planned at this time. The patient has been provided with needed resources. [facility] Care Guide , Embedded Care Coordination [facility], Care Management  [phone] [address] [place] , [place] [address] Email : [email or web address]

## 2021-07-01 NOTE — Progress Notes (Signed)
Esophageal Manometry done per protocol. Patient tolerated well without distress or complication.  
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
## 2021-07-05 DIAGNOSIS — E119 Type 2 diabetes mellitus without complications: Secondary | ICD-10-CM | POA: Diagnosis not present

## 2021-07-05 DIAGNOSIS — F32A Depression, unspecified: Secondary | ICD-10-CM | POA: Diagnosis not present

## 2021-07-05 DIAGNOSIS — I25110 Atherosclerotic heart disease of native coronary artery with unstable angina pectoris: Secondary | ICD-10-CM | POA: Diagnosis not present

## 2021-07-05 DIAGNOSIS — E782 Mixed hyperlipidemia: Secondary | ICD-10-CM | POA: Diagnosis not present

## 2021-07-07 ENCOUNTER — Ambulatory Visit (INDEPENDENT_AMBULATORY_CARE_PROVIDER_SITE_OTHER): Payer: Medicare Other | Admitting: *Deleted

## 2021-07-07 DIAGNOSIS — Z658 Other specified problems related to psychosocial circumstances: Secondary | ICD-10-CM

## 2021-07-07 DIAGNOSIS — E119 Type 2 diabetes mellitus without complications: Secondary | ICD-10-CM

## 2021-07-07 DIAGNOSIS — I25110 Atherosclerotic heart disease of native coronary artery with unstable angina pectoris: Secondary | ICD-10-CM

## 2021-07-07 DIAGNOSIS — F32A Depression, unspecified: Secondary | ICD-10-CM
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
## 2021-07-07 NOTE — Patient Instructions (Signed)
Visit Information ? ?Thank you for taking time to visit with me today. Please don't hesitate to contact me if I can be of assistance to you before our next scheduled telephone appointment. ? ?Our next appointment is by telephone on 07/21/21  ? ?Please call the care guide team at [phone] if you need to cancel or reschedule your appointment.  ? ?If you are experiencing a Mental Health or Behavioral Health Crisis or need someone to talk to, please call the Suicide and Crisis Lifeline: 988 ?call the [place] National Suicide Prevention Lifeline: 515 154 3036 or TTY: 601-709-1472 TTY 205-124-8455) to talk to a trained counselor ?call 1-800-273-TALK (toll free, 24 hour hotline) ?call the [facility]: [phone] ?call 911  ? ?The patient verbalized understanding of instructions, educational materials, and care plan provided today and declined offer to receive copy of patient instructions, educational materials, and care plan.  ? ?Reece Levy MSW, LCSW ?Licensed Clinical Social Worker ?[facility]   ?[phone]  ?

## 2021-07-08 ENCOUNTER — Telehealth: Payer: Medicare HMO

## 2021-07-12 ENCOUNTER — Encounter (HOSPITAL_COMMUNITY)
Admission: RE | Admit: 2021-07-12 | Discharge: 2021-07-12 | Disposition: A | Discharge status: Home / Self Care | Payer: Medicare Other | Source: Ambulatory Visit | Attending: Surgery | Admitting: Surgery

## 2021-07-12 ENCOUNTER — Other Ambulatory Visit: Payer: Self-pay

## 2021-07-12 ENCOUNTER — Other Ambulatory Visit (HOSPITAL_COMMUNITY): Payer: Self-pay | Admitting: *Deleted

## 2021-07-12 DIAGNOSIS — E876 Hypokalemia: Secondary | ICD-10-CM | POA: Diagnosis not present

## 2021-07-12 DIAGNOSIS — R0602 Shortness of breath: Secondary | ICD-10-CM | POA: Diagnosis not present

## 2021-07-12 DIAGNOSIS — Z6835 Body mass index (BMI) 35.0-35.9, adult: Secondary | ICD-10-CM | POA: Diagnosis not present

## 2021-07-12 DIAGNOSIS — R519 Headache, unspecified: Secondary | ICD-10-CM | POA: Diagnosis not present

## 2021-07-12 DIAGNOSIS — F419 Anxiety disorder, unspecified: Secondary | ICD-10-CM | POA: Diagnosis present

## 2021-07-12 DIAGNOSIS — I509 Heart failure, unspecified: Secondary | ICD-10-CM | POA: Diagnosis not present

## 2021-07-12 DIAGNOSIS — I200 Unstable angina: Secondary | ICD-10-CM | POA: Diagnosis not present

## 2021-07-12 DIAGNOSIS — K219 Gastro-esophageal reflux disease without esophagitis: Secondary | ICD-10-CM | POA: Diagnosis not present

## 2021-07-12 DIAGNOSIS — I110 Hypertensive heart disease with heart failure: Secondary | ICD-10-CM | POA: Diagnosis not present

## 2021-07-12 DIAGNOSIS — K440 Diaphragmatic hernia with obstruction, without gangrene: Secondary | ICD-10-CM | POA: Diagnosis not present

## 2021-07-12 DIAGNOSIS — Z20822 Contact with and (suspected) exposure to covid-19: Secondary | ICD-10-CM | POA: Insufficient documentation

## 2021-07-12 DIAGNOSIS — R531 Weakness: Secondary | ICD-10-CM | POA: Diagnosis not present

## 2021-07-12 DIAGNOSIS — R410 Disorientation, unspecified: Secondary | ICD-10-CM | POA: Diagnosis not present

## 2021-07-12 DIAGNOSIS — I25119 Atherosclerotic heart disease of native coronary artery with unspecified angina pectoris: Secondary | ICD-10-CM | POA: Diagnosis not present

## 2021-07-12 DIAGNOSIS — E785 Hyperlipidemia, unspecified: Secondary | ICD-10-CM | POA: Diagnosis not present

## 2021-07-12 DIAGNOSIS — K562 Volvulus: Secondary | ICD-10-CM | POA: Diagnosis not present

## 2021-07-12 DIAGNOSIS — K449 Diaphragmatic hernia without obstruction or gangrene: Secondary | ICD-10-CM | POA: Diagnosis not present

## 2021-07-12 DIAGNOSIS — R1314 Dysphagia, pharyngoesophageal phase: Secondary | ICD-10-CM | POA: Diagnosis not present

## 2021-07-12 DIAGNOSIS — R269 Unspecified abnormalities of gait and mobility: Secondary | ICD-10-CM | POA: Diagnosis not present

## 2021-07-12 DIAGNOSIS — Z01812 Encounter for preprocedural laboratory examination: Secondary | ICD-10-CM | POA: Insufficient documentation

## 2021-07-12 DIAGNOSIS — M069 Rheumatoid arthritis, unspecified: Secondary | ICD-10-CM | POA: Diagnosis not present

## 2021-07-12 DIAGNOSIS — E119 Type 2 diabetes mellitus without complications: Secondary | ICD-10-CM | POA: Diagnosis not present

## 2021-07-12 DIAGNOSIS — Z01818 Encounter for other preprocedural examination: Secondary | ICD-10-CM

## 2021-07-12 DIAGNOSIS — I5022 Chronic systolic (congestive) heart failure: Secondary | ICD-10-CM | POA: Diagnosis not present

## 2021-07-12 DIAGNOSIS — E669 Obesity, unspecified: Secondary | ICD-10-CM | POA: Diagnosis present

## 2021-07-12 DIAGNOSIS — M1990 Unspecified osteoarthritis, unspecified site: Secondary | ICD-10-CM | POA: Diagnosis not present

## 2021-07-12 DIAGNOSIS — G9341 Metabolic encephalopathy: Secondary | ICD-10-CM | POA: Diagnosis not present

## 2021-07-12 DIAGNOSIS — G894 Chronic pain syndrome: Secondary | ICD-10-CM | POA: Diagnosis not present

## 2021-07-12 DIAGNOSIS — H409 Unspecified glaucoma: Secondary | ICD-10-CM | POA: Diagnosis not present

## 2021-07-12 DIAGNOSIS — Z6837 Body mass index (BMI) 37.0-37.9, adult: Secondary | ICD-10-CM | POA: Diagnosis not present

## 2021-07-12 DIAGNOSIS — R7401 Elevation of levels of liver transaminase levels: Secondary | ICD-10-CM | POA: Diagnosis not present

## 2021-07-12 DIAGNOSIS — D696 Thrombocytopenia, unspecified: Secondary | ICD-10-CM | POA: Diagnosis not present

## 2021-07-12 DIAGNOSIS — E039 Hypothyroidism, unspecified: Secondary | ICD-10-CM | POA: Diagnosis not present

## 2021-07-12 LAB — SARS CORONAVIRUS 2 (TAT 6-24 HRS): SARS Coronavirus 2: NEGATIVE

## 2021-07-14 NOTE — Anesthesia Preprocedure Evaluation (Addendum)
Anesthesia Evaluation  ?Patient identified by MRN, date of birth, ID band ?Patient awake ? ? ? ?Reviewed: ?Allergy & Precautions, NPO status , Patient's Chart, lab work & pertinent test results, reviewed documented beta blocker date and time  ? ?Airway ?Mallampati: III ? ?TM Distance: >3 FB ?Neck ROM: Full ? ?Mouth opening: Limited Mouth Opening ? Dental ?no notable dental hx. ?(+) Missing,  ?  ?Pulmonary ?PE ?  ?Pulmonary exam normal ?breath sounds clear to auscultation ? ? ? ? ? ? Cardiovascular ?hypertension, Pt. on home beta blockers and Pt. on medications ?+ angina + CAD and +CHF  ?Normal cardiovascular exam ?Rhythm:Regular Rate:Normal ? ?TTE 2020 ??1. Left ventricular ejection fraction, by visual estimation, is 55 to  ?60%. The left ventricle has normal function. Normal left ventricular size.  ?Left ventricular septal wall thickness was mildly increased. Mildly  ?increased left ventricular posterior  ?wall thickness. There is mildly increased left ventricular hypertrophy.  ??2. Left ventricular diastolic Doppler parameters are indeterminate  ?pattern of LV diastolic filling.  ??3. Global right ventricle has normal systolic function.The right  ?ventricular size is normal. No increase in right ventricular wall  ?thickness.  ??4. Left atrial size was mildly dilated.  ??5. Right atrial size was normal.  ??6. The mitral valve is normal in structure. No evidence of mitral valve  ?regurgitation. No evidence of mitral stenosis.  ??7. The tricuspid valve is normal in structure. Tricuspid valve  ?regurgitation is trivial.  ??8. The pulmonic valve was normal in structure. Pulmonic valve  ?regurgitation is trivial by color flow Doppler.  ??9. The aortic valve is normal in structure. Aortic valve regurgitation  ?was not visualized by color flow Doppler. Structurally normal aortic  ?valve, with no evidence of sclerosis or stenosis.  ?10. Normal pulmonary artery systolic pressure.  ?11. No  prior study for comparison.  ? ?Cath 2021 ?Stable angiographic findings from 2019-mid LAD myocardial bridging appears stable. ->  Would indicate that her symptoms are likely related to bridging. ?Otherwise normal coronary arteries. ?Normal LV function and mildly elevated EDP ? ?  ?Neuro/Psych ?PSYCHIATRIC DISORDERS Anxiety Depression negative neurological ROS ?   ? GI/Hepatic ?Neg liver ROS, hiatal hernia, GERD  ,  ?Endo/Other  ?diabetes, Well ControlledHypothyroidism  ? Renal/GU ?negative Renal ROS  ?negative genitourinary ?  ?Musculoskeletal ? ?(+) Arthritis , Rheumatoid disorders,   ? Abdominal ?  ?Peds ? Hematology ?negative hematology ROS ?(+)   ?Anesthesia Other Findings ? ? Reproductive/Obstetrics ? ?  ? ? ? ? ? ? ? ? ? ? ? ? ? ?  ?  ? ? ? ? ? ? ? ?Anesthesia Physical ?Anesthesia Plan ? ?ASA: 3 ? ?Anesthesia Plan: General  ? ?Post-op Pain Management: Tylenol PO (pre-op)* and Lidocaine infusion*  ? ?Induction: Intravenous ? ?PONV Risk Score and Plan: 3 and Dexamethasone, Ondansetron and Treatment may vary due to age or medical condition ? ?Airway Management Planned: Oral ETT ? ?Additional Equipment:  ? ?Intra-op Plan:  ? ?Post-operative Plan: Extubation in OR ? ?Informed Consent: I have reviewed the patients History and Physical, chart, labs and discussed the procedure including the risks, benefits and alternatives for the proposed anesthesia with the patient or authorized representative who has indicated his/her understanding and acceptance.  ? ? ? ?Dental advisory given ? ?Plan Discussed with: CRNA ? ?Anesthesia Plan Comments: (2 IVs)  ? ? ? ? ? ?Anesthesia Quick Evaluation ? ?

## 2021-07-15 ENCOUNTER — Inpatient Hospital Stay (HOSPITAL_COMMUNITY)
Admission: AD | Admit: 2021-07-15 | Discharge: 2021-07-20 | DRG: 326 | Disposition: A | Discharge status: Home Health | Payer: Medicare Other | Attending: Surgery | Admitting: Surgery

## 2021-07-15 ENCOUNTER — Other Ambulatory Visit: Payer: Self-pay

## 2021-07-15 ENCOUNTER — Ambulatory Visit (HOSPITAL_COMMUNITY): Payer: Medicare Other | Admitting: Anesthesiology

## 2021-07-15 ENCOUNTER — Encounter (HOSPITAL_COMMUNITY): Payer: Self-pay | Admitting: Surgery

## 2021-07-15 ENCOUNTER — Encounter (HOSPITAL_COMMUNITY)
Admission: AD | Disposition: A | Discharge status: Home Health | Payer: Self-pay | Source: Home / Self Care | Attending: Surgery

## 2021-07-15 DIAGNOSIS — E785 Hyperlipidemia, unspecified: Secondary | ICD-10-CM | POA: Diagnosis present

## 2021-07-15 DIAGNOSIS — I10 Essential (primary) hypertension: Secondary | ICD-10-CM | POA: Diagnosis present

## 2021-07-15 DIAGNOSIS — M5136 Other intervertebral disc degeneration, lumbar region: Secondary | ICD-10-CM | POA: Diagnosis present

## 2021-07-15 DIAGNOSIS — F419 Anxiety disorder, unspecified: Secondary | ICD-10-CM | POA: Diagnosis present

## 2021-07-15 DIAGNOSIS — E039 Hypothyroidism, unspecified: Secondary | ICD-10-CM | POA: Diagnosis present

## 2021-07-15 DIAGNOSIS — R1314 Dysphagia, pharyngoesophageal phase: Secondary | ICD-10-CM | POA: Diagnosis present

## 2021-07-15 DIAGNOSIS — I110 Hypertensive heart disease with heart failure: Secondary | ICD-10-CM

## 2021-07-15 DIAGNOSIS — E669 Obesity, unspecified: Secondary | ICD-10-CM | POA: Diagnosis present

## 2021-07-15 DIAGNOSIS — I200 Unstable angina: Secondary | ICD-10-CM | POA: Diagnosis present

## 2021-07-15 DIAGNOSIS — K5900 Constipation, unspecified: Secondary | ICD-10-CM | POA: Diagnosis present

## 2021-07-15 DIAGNOSIS — J9601 Acute respiratory failure with hypoxia: Secondary | ICD-10-CM | POA: Diagnosis present

## 2021-07-15 DIAGNOSIS — M1990 Unspecified osteoarthritis, unspecified site: Secondary | ICD-10-CM | POA: Diagnosis present

## 2021-07-15 DIAGNOSIS — R0789 Other chest pain: Secondary | ICD-10-CM | POA: Diagnosis present

## 2021-07-15 DIAGNOSIS — Z6837 Body mass index (BMI) 37.0-37.9, adult: Secondary | ICD-10-CM | POA: Diagnosis not present

## 2021-07-15 DIAGNOSIS — Z79899 Other long term (current) drug therapy: Secondary | ICD-10-CM

## 2021-07-15 DIAGNOSIS — Z79890 Hormone replacement therapy (postmenopausal): Secondary | ICD-10-CM

## 2021-07-15 DIAGNOSIS — Z20822 Contact with and (suspected) exposure to covid-19: Secondary | ICD-10-CM | POA: Diagnosis present

## 2021-07-15 DIAGNOSIS — Z885 Allergy status to narcotic agent status: Secondary | ICD-10-CM

## 2021-07-15 DIAGNOSIS — Z90710 Acquired absence of both cervix and uterus: Secondary | ICD-10-CM

## 2021-07-15 DIAGNOSIS — R0602 Shortness of breath: Secondary | ICD-10-CM

## 2021-07-15 DIAGNOSIS — E119 Type 2 diabetes mellitus without complications: Secondary | ICD-10-CM | POA: Diagnosis present

## 2021-07-15 DIAGNOSIS — H409 Unspecified glaucoma: Secondary | ICD-10-CM | POA: Diagnosis present

## 2021-07-15 DIAGNOSIS — R0902 Hypoxemia: Secondary | ICD-10-CM | POA: Diagnosis present

## 2021-07-15 DIAGNOSIS — D696 Thrombocytopenia, unspecified: Secondary | ICD-10-CM | POA: Diagnosis present

## 2021-07-15 DIAGNOSIS — R079 Chest pain, unspecified: Secondary | ICD-10-CM | POA: Diagnosis present

## 2021-07-15 DIAGNOSIS — R269 Unspecified abnormalities of gait and mobility: Secondary | ICD-10-CM

## 2021-07-15 DIAGNOSIS — Z7982 Long term (current) use of aspirin: Secondary | ICD-10-CM

## 2021-07-15 DIAGNOSIS — K440 Diaphragmatic hernia with obstruction, without gangrene: Principal | ICD-10-CM | POA: Diagnosis present

## 2021-07-15 DIAGNOSIS — Z8639 Personal history of other endocrine, nutritional and metabolic disease: Secondary | ICD-10-CM

## 2021-07-15 DIAGNOSIS — K219 Gastro-esophageal reflux disease without esophagitis: Secondary | ICD-10-CM | POA: Diagnosis present

## 2021-07-15 DIAGNOSIS — M51369 Other intervertebral disc degeneration, lumbar region without mention of lumbar back pain or lower extremity pain: Secondary | ICD-10-CM | POA: Diagnosis present

## 2021-07-15 DIAGNOSIS — Z01818 Encounter for other preprocedural examination: Secondary | ICD-10-CM

## 2021-07-15 DIAGNOSIS — M069 Rheumatoid arthritis, unspecified: Secondary | ICD-10-CM | POA: Diagnosis present

## 2021-07-15 DIAGNOSIS — R7401 Elevation of levels of liver transaminase levels: Secondary | ICD-10-CM | POA: Diagnosis not present

## 2021-07-15 DIAGNOSIS — I5022 Chronic systolic (congestive) heart failure: Secondary | ICD-10-CM | POA: Diagnosis present

## 2021-07-15 DIAGNOSIS — Z6835 Body mass index (BMI) 35.0-35.9, adult: Secondary | ICD-10-CM | POA: Diagnosis not present

## 2021-07-15 DIAGNOSIS — Z9889 Other specified postprocedural states: Secondary | ICD-10-CM

## 2021-07-15 DIAGNOSIS — Z79891 Long term (current) use of opiate analgesic: Secondary | ICD-10-CM

## 2021-07-15 DIAGNOSIS — E876 Hypokalemia: Secondary | ICD-10-CM | POA: Diagnosis present

## 2021-07-15 DIAGNOSIS — G894 Chronic pain syndrome: Secondary | ICD-10-CM | POA: Diagnosis present

## 2021-07-15 DIAGNOSIS — I25119 Atherosclerotic heart disease of native coronary artery with unspecified angina pectoris: Secondary | ICD-10-CM

## 2021-07-15 DIAGNOSIS — K5904 Chronic idiopathic constipation: Secondary | ICD-10-CM | POA: Diagnosis present

## 2021-07-15 DIAGNOSIS — K562 Volvulus: Secondary | ICD-10-CM | POA: Diagnosis present

## 2021-07-15 DIAGNOSIS — J90 Pleural effusion, not elsewhere classified: Secondary | ICD-10-CM | POA: Diagnosis present

## 2021-07-15 DIAGNOSIS — Z888 Allergy status to other drugs, medicaments and biological substances status: Secondary | ICD-10-CM

## 2021-07-15 DIAGNOSIS — R413 Other amnesia: Secondary | ICD-10-CM | POA: Diagnosis present

## 2021-07-15 DIAGNOSIS — I509 Heart failure, unspecified: Secondary | ICD-10-CM

## 2021-07-15 DIAGNOSIS — K3189 Other diseases of stomach and duodenum: Secondary | ICD-10-CM | POA: Diagnosis present

## 2021-07-15 DIAGNOSIS — G9341 Metabolic encephalopathy: Secondary | ICD-10-CM | POA: Diagnosis not present

## 2021-07-15 DIAGNOSIS — K224 Dyskinesia of esophagus: Secondary | ICD-10-CM | POA: Diagnosis present

## 2021-07-15 DIAGNOSIS — K449 Diaphragmatic hernia without obstruction or gangrene: Secondary | ICD-10-CM | POA: Diagnosis not present

## 2021-07-15 DIAGNOSIS — E441 Mild protein-calorie malnutrition: Secondary | ICD-10-CM | POA: Diagnosis present

## 2021-07-15 HISTORY — PX: XI ROBOTIC ASSISTED HIATAL HERNIA REPAIR: SHX6889

## 2021-07-15 HISTORY — DX: Diaphragmatic hernia with obstruction, without gangrene: K44.0

## 2021-07-15 LAB — GLUCOSE, CAPILLARY
Glucose-Capillary: 110 mg/dL — ABNORMAL HIGH (ref 70–99)
Glucose-Capillary: 127 mg/dL — ABNORMAL HIGH (ref 70–99)

## 2021-07-15 SURGERY — REPAIR, HERNIA, HIATAL, ROBOT-ASSISTED
Anesthesia: General | Site: Abdomen

## 2021-07-15 MED ORDER — METHOCARBAMOL 1000 MG/10ML IJ SOLN
1000.0000 mg | Freq: Four times a day (QID) | INTRAVENOUS | Status: DC | PRN
  Filled 2021-07-15: qty 10

## 2021-07-15 MED ORDER — METOCLOPRAMIDE HCL 5 MG/ML IJ SOLN: 5.0000 mg | Freq: Three times a day (TID) | INTRAMUSCULAR | Status: DC | PRN

## 2021-07-15 MED ORDER — ACETAMINOPHEN 500 MG PO TABS
1000.0000 mg | ORAL_TABLET | Freq: Four times a day (QID) | ORAL | Status: DC
  Administered 2021-07-16 – 2021-07-19 (×11): 1000 mg via ORAL
  Filled 2021-07-15 (×12): qty 2

## 2021-07-15 MED ORDER — LIP MEDEX EX OINT
1.0000 "application " | TOPICAL_OINTMENT | Freq: Two times a day (BID) | CUTANEOUS | Status: DC
  Administered 2021-07-16 – 2021-07-20 (×9): 1 via TOPICAL
  Filled 2021-07-15: qty 7

## 2021-07-15 MED ORDER — ENSURE PRE-SURGERY PO LIQD
592.0000 mL | Freq: Once | ORAL | Status: DC
  Filled 2021-07-15: qty 592

## 2021-07-15 MED ORDER — DEXAMETHASONE SODIUM PHOSPHATE 10 MG/ML IJ SOLN
INTRAMUSCULAR | Status: DC | PRN
  Administered 2021-07-15: 5 mg via INTRAVENOUS

## 2021-07-15 MED ORDER — FENTANYL CITRATE PF 50 MCG/ML IJ SOSY
25.0000 ug | PREFILLED_SYRINGE | INTRAMUSCULAR | Status: DC | PRN
  Administered 2021-07-15: 25 ug via INTRAVENOUS

## 2021-07-15 MED ORDER — PHENYLEPHRINE 40 MCG/ML (10ML) SYRINGE FOR IV PUSH (FOR BLOOD PRESSURE SUPPORT)
PREFILLED_SYRINGE | INTRAVENOUS | Status: DC | PRN
  Administered 2021-07-15: 200 ug via INTRAVENOUS

## 2021-07-15 MED ORDER — BUPIVACAINE LIPOSOME 1.3 % IJ SUSP
INTRAMUSCULAR | Status: DC | PRN
  Administered 2021-07-15: 20 mL

## 2021-07-15 MED ORDER — ENSURE PRE-SURGERY PO LIQD
296.0000 mL | Freq: Once | ORAL | Status: DC
  Filled 2021-07-15: qty 296

## 2021-07-15 MED ORDER — LACTATED RINGERS IR SOLN
Status: DC | PRN
  Administered 2021-07-15: 1000 mL

## 2021-07-15 MED ORDER — LIDOCAINE HCL 2 % IJ SOLN
INTRAMUSCULAR | Status: AC
  Filled 2021-07-15: qty 20

## 2021-07-15 MED ORDER — VITAMIN D3 25 MCG (1000 UNIT) PO TABS
2000.0000 [IU] | ORAL_TABLET | Freq: Every day | ORAL | Status: DC
  Administered 2021-07-16 – 2021-07-20 (×5): 2000 [IU] via ORAL
  Filled 2021-07-15 (×5): qty 2

## 2021-07-15 MED ORDER — ENALAPRILAT 1.25 MG/ML IV SOLN
0.6250 mg | Freq: Four times a day (QID) | INTRAVENOUS | Status: DC | PRN
  Filled 2021-07-15: qty 1

## 2021-07-15 MED ORDER — MENTHOL 3 MG MT LOZG: 1.0000 | LOZENGE | OROMUCOSAL | Status: DC | PRN

## 2021-07-15 MED ORDER — SCOPOLAMINE 1 MG/3DAYS TD PT72
1.0000 | MEDICATED_PATCH | TRANSDERMAL | Status: DC
Start: 2021-07-15 — End: 2021-07-18
  Administered 2021-07-15: 1.5 mg via TRANSDERMAL
  Filled 2021-07-15: qty 1

## 2021-07-15 MED ORDER — SIMETHICONE 80 MG PO CHEW: 40.0000 mg | CHEWABLE_TABLET | Freq: Four times a day (QID) | ORAL | Status: DC | PRN

## 2021-07-15 MED ORDER — METHOTREXATE 2.5 MG PO TABS
25.0000 mg | ORAL_TABLET | ORAL | Status: DC
  Administered 2021-07-17: 25 mg via ORAL
  Filled 2021-07-15: qty 10

## 2021-07-15 MED ORDER — LIDOCAINE HCL (PF) 2 % IJ SOLN
INTRAMUSCULAR | Status: AC
  Filled 2021-07-15: qty 5

## 2021-07-15 MED ORDER — ALBUMIN HUMAN 5 % IV SOLN
INTRAVENOUS | Status: AC
  Filled 2021-07-15: qty 250

## 2021-07-15 MED ORDER — FENTANYL CITRATE (PF) 250 MCG/5ML IJ SOLN
INTRAMUSCULAR | Status: AC
  Filled 2021-07-15: qty 5

## 2021-07-15 MED ORDER — ONDANSETRON 4 MG PO TBDP: 4.0000 mg | ORAL_TABLET | Freq: Four times a day (QID) | ORAL | Status: DC | PRN

## 2021-07-15 MED ORDER — PROPOFOL 10 MG/ML IV BOLUS
INTRAVENOUS | Status: AC
  Filled 2021-07-15: qty 20

## 2021-07-15 MED ORDER — LACTATED RINGERS IV SOLN
INTRAVENOUS | Status: DC
Start: 2021-07-15 — End: 2021-07-15

## 2021-07-15 MED ORDER — PHENYLEPHRINE 40 MCG/ML (10ML) SYRINGE FOR IV PUSH (FOR BLOOD PRESSURE SUPPORT)
PREFILLED_SYRINGE | INTRAVENOUS | Status: AC
  Filled 2021-07-15: qty 10

## 2021-07-15 MED ORDER — SODIUM CHLORIDE 0.9 % IV SOLN
2.0000 g | INTRAVENOUS | Status: AC
  Administered 2021-07-15: 2 g via INTRAVENOUS
  Filled 2021-07-15: qty 20

## 2021-07-15 MED ORDER — DILTIAZEM HCL ER COATED BEADS 120 MG PO CP24
120.0000 mg | ORAL_CAPSULE | Freq: Every day | ORAL | Status: DC
  Administered 2021-07-16 – 2021-07-20 (×5): 120 mg via ORAL
  Filled 2021-07-15 (×5): qty 1

## 2021-07-15 MED ORDER — BISACODYL 10 MG RE SUPP: 10.0000 mg | Freq: Every day | RECTAL | Status: DC | PRN

## 2021-07-15 MED ORDER — GABAPENTIN 300 MG PO CAPS
300.0000 mg | ORAL_CAPSULE | ORAL | Status: AC
  Administered 2021-07-15: 300 mg via ORAL
  Filled 2021-07-15: qty 1

## 2021-07-15 MED ORDER — ONDANSETRON HCL 4 MG/2ML IJ SOLN
4.0000 mg | Freq: Four times a day (QID) | INTRAMUSCULAR | Status: DC | PRN
  Administered 2021-07-19: 4 mg via INTRAVENOUS
  Filled 2021-07-15: qty 2

## 2021-07-15 MED ORDER — BUPIVACAINE LIPOSOME 1.3 % IJ SUSP: 20.0000 mL | Freq: Once | INTRAMUSCULAR | Status: DC

## 2021-07-15 MED ORDER — BUPIVACAINE-EPINEPHRINE 0.25% -1:200000 IJ SOLN
INTRAMUSCULAR | Status: DC | PRN
  Administered 2021-07-15: 60 mL

## 2021-07-15 MED ORDER — BUPIVACAINE LIPOSOME 1.3 % IJ SUSP
INTRAMUSCULAR | Status: AC
  Filled 2021-07-15: qty 20

## 2021-07-15 MED ORDER — SUGAMMADEX SODIUM 200 MG/2ML IV SOLN
INTRAVENOUS | Status: DC | PRN
  Administered 2021-07-15: 200 mg via INTRAVENOUS

## 2021-07-15 MED ORDER — LATANOPROST 0.005 % OP SOLN
1.0000 [drp] | Freq: Every day | OPHTHALMIC | Status: DC
  Administered 2021-07-16 – 2021-07-19 (×4): 1 [drp] via OPHTHALMIC
  Filled 2021-07-15: qty 2.5

## 2021-07-15 MED ORDER — POLYETHYLENE GLYCOL 3350 17 G PO PACK
17.0000 g | PACK | Freq: Two times a day (BID) | ORAL | Status: DC | PRN
  Administered 2021-07-19: 17 g via ORAL

## 2021-07-15 MED ORDER — GABAPENTIN 300 MG PO CAPS
300.0000 mg | ORAL_CAPSULE | Freq: Two times a day (BID) | ORAL | Status: DC
  Administered 2021-07-16 – 2021-07-18 (×5): 300 mg via ORAL
  Filled 2021-07-15 (×5): qty 1

## 2021-07-15 MED ORDER — ROCURONIUM BROMIDE 10 MG/ML (PF) SYRINGE
PREFILLED_SYRINGE | INTRAVENOUS | Status: AC
  Filled 2021-07-15: qty 10

## 2021-07-15 MED ORDER — ACETAMINOPHEN 500 MG PO TABS
1000.0000 mg | ORAL_TABLET | Freq: Once | ORAL | Status: DC
  Filled 2021-07-15: qty 2

## 2021-07-15 MED ORDER — ORAL CARE MOUTH RINSE
15.0000 mL | Freq: Once | OROMUCOSAL | Status: AC
  Administered 2021-07-15: 15 mL via OROMUCOSAL

## 2021-07-15 MED ORDER — DEXAMETHASONE SODIUM PHOSPHATE 4 MG/ML IJ SOLN: 4.0000 mg | INTRAMUSCULAR | Status: DC

## 2021-07-15 MED ORDER — DEXAMETHASONE SODIUM PHOSPHATE 10 MG/ML IJ SOLN
INTRAMUSCULAR | Status: AC
  Filled 2021-07-15: qty 1

## 2021-07-15 MED ORDER — PHENOL 1.4 % MT LIQD
2.0000 | OROMUCOSAL | Status: DC | PRN
Start: 2021-07-15 — End: 2021-07-19

## 2021-07-15 MED ORDER — ONDANSETRON HCL 4 MG/2ML IJ SOLN
INTRAMUSCULAR | Status: AC
  Filled 2021-07-15: qty 2

## 2021-07-15 MED ORDER — EPHEDRINE SULFATE-NACL 50-0.9 MG/10ML-% IV SOSY
PREFILLED_SYRINGE | INTRAVENOUS | Status: DC | PRN
  Administered 2021-07-15: 5 mg via INTRAVENOUS
  Administered 2021-07-15: 10 mg via INTRAVENOUS

## 2021-07-15 MED ORDER — HYDROMORPHONE HCL 1 MG/ML IJ SOLN
0.5000 mg | INTRAMUSCULAR | Status: DC | PRN
  Administered 2021-07-15: 2 mg via INTRAVENOUS
  Filled 2021-07-15: qty 2

## 2021-07-15 MED ORDER — PROCHLORPERAZINE MALEATE 10 MG PO TABS
10.0000 mg | ORAL_TABLET | Freq: Four times a day (QID) | ORAL | Status: DC | PRN
  Filled 2021-07-15: qty 1

## 2021-07-15 MED ORDER — PRAVASTATIN SODIUM 20 MG PO TABS
20.0000 mg | ORAL_TABLET | Freq: Every day | ORAL | Status: DC
  Administered 2021-07-16 – 2021-07-18 (×3): 20 mg via ORAL
  Filled 2021-07-15 (×4): qty 1

## 2021-07-15 MED ORDER — GLYCOPYRROLATE 0.2 MG/ML IJ SOLN
INTRAMUSCULAR | Status: DC | PRN
  Administered 2021-07-15: .2 mg via INTRAVENOUS

## 2021-07-15 MED ORDER — DIPHENHYDRAMINE HCL 12.5 MG/5ML PO ELIX: 12.5000 mg | ORAL_SOLUTION | Freq: Four times a day (QID) | ORAL | Status: DC | PRN

## 2021-07-15 MED ORDER — PHENYLEPHRINE HCL-NACL 20-0.9 MG/250ML-% IV SOLN
INTRAVENOUS | Status: DC | PRN
  Administered 2021-07-15: 50 ug/min via INTRAVENOUS

## 2021-07-15 MED ORDER — ISOSORBIDE MONONITRATE ER 30 MG PO TB24
30.0000 mg | ORAL_TABLET | Freq: Every day | ORAL | Status: DC
  Administered 2021-07-16 – 2021-07-20 (×5): 30 mg via ORAL
  Filled 2021-07-15 (×5): qty 1

## 2021-07-15 MED ORDER — ASPIRIN EC 81 MG PO TBEC
81.0000 mg | DELAYED_RELEASE_TABLET | Freq: Every day | ORAL | Status: DC
  Administered 2021-07-16 – 2021-07-20 (×5): 81 mg via ORAL
  Filled 2021-07-15 (×5): qty 1

## 2021-07-15 MED ORDER — SODIUM CHLORIDE 0.9% FLUSH
3.0000 mL | Freq: Two times a day (BID) | INTRAVENOUS | Status: DC
  Administered 2021-07-17 – 2021-07-20 (×6): 3 mL via INTRAVENOUS

## 2021-07-15 MED ORDER — NITROGLYCERIN 0.4 MG SL SUBL: 0.4000 mg | SUBLINGUAL_TABLET | SUBLINGUAL | Status: DC | PRN

## 2021-07-15 MED ORDER — FENTANYL CITRATE (PF) 250 MCG/5ML IJ SOLN
INTRAMUSCULAR | Status: DC | PRN
  Administered 2021-07-15: 100 ug via INTRAVENOUS
  Administered 2021-07-15: 25 ug via INTRAVENOUS
  Administered 2021-07-15: 50 ug via INTRAVENOUS
  Administered 2021-07-15: 25 ug via INTRAVENOUS

## 2021-07-15 MED ORDER — FENTANYL CITRATE PF 50 MCG/ML IJ SOSY
PREFILLED_SYRINGE | INTRAMUSCULAR | Status: AC
  Filled 2021-07-15: qty 1

## 2021-07-15 MED ORDER — PROPOFOL 10 MG/ML IV BOLUS
INTRAVENOUS | Status: DC | PRN
  Administered 2021-07-15: 100 mg via INTRAVENOUS

## 2021-07-15 MED ORDER — MAGIC MOUTHWASH
15.0000 mL | Freq: Four times a day (QID) | ORAL | Status: DC | PRN
  Filled 2021-07-15: qty 15

## 2021-07-15 MED ORDER — PROCHLORPERAZINE EDISYLATE 10 MG/2ML IJ SOLN: 5.0000 mg | Freq: Four times a day (QID) | INTRAMUSCULAR | Status: DC | PRN

## 2021-07-15 MED ORDER — CHLORHEXIDINE GLUCONATE CLOTH 2 % EX PADS: 6.0000 | MEDICATED_PAD | Freq: Once | CUTANEOUS | Status: DC

## 2021-07-15 MED ORDER — 0.9 % SODIUM CHLORIDE (POUR BTL) OPTIME
TOPICAL | Status: DC | PRN
  Administered 2021-07-15: 2000 mL

## 2021-07-15 MED ORDER — PANTOPRAZOLE SODIUM 40 MG PO TBEC
80.0000 mg | DELAYED_RELEASE_TABLET | Freq: Every day | ORAL | Status: DC
  Administered 2021-07-16 – 2021-07-18 (×3): 80 mg via ORAL
  Filled 2021-07-15 (×4): qty 2

## 2021-07-15 MED ORDER — LACTATED RINGERS IV BOLUS: 1000.0000 mL | Freq: Three times a day (TID) | INTRAVENOUS | Status: AC | PRN

## 2021-07-15 MED ORDER — BUMETANIDE 1 MG PO TABS
1.0000 mg | ORAL_TABLET | Freq: Every day | ORAL | Status: DC | PRN
  Filled 2021-07-15: qty 1

## 2021-07-15 MED ORDER — SIMETHICONE 40 MG/0.6ML PO SUSP
80.0000 mg | Freq: Four times a day (QID) | ORAL | Status: DC | PRN
  Filled 2021-07-15: qty 1.2

## 2021-07-15 MED ORDER — ROCURONIUM BROMIDE 10 MG/ML (PF) SYRINGE
PREFILLED_SYRINGE | INTRAVENOUS | Status: DC | PRN
  Administered 2021-07-15: 20 mg via INTRAVENOUS
  Administered 2021-07-15: 80 mg via INTRAVENOUS
  Administered 2021-07-15: 20 mg via INTRAVENOUS

## 2021-07-15 MED ORDER — ONDANSETRON HCL 4 MG PO TABS: 4.0000 mg | ORAL_TABLET | Freq: Three times a day (TID) | ORAL | 5 refills | Status: DC | PRN

## 2021-07-15 MED ORDER — ALUM & MAG HYDROXIDE-SIMETH 200-200-20 MG/5ML PO SUSP: 30.0000 mL | Freq: Four times a day (QID) | ORAL | Status: DC | PRN

## 2021-07-15 MED ORDER — FOLIC ACID 1 MG PO TABS
1.0000 mg | ORAL_TABLET | Freq: Every day | ORAL | Status: DC
  Administered 2021-07-16 – 2021-07-20 (×5): 1 mg via ORAL
  Filled 2021-07-15 (×5): qty 1

## 2021-07-15 MED ORDER — METOPROLOL TARTRATE 5 MG/5ML IV SOLN: 5.0000 mg | Freq: Four times a day (QID) | INTRAVENOUS | Status: DC | PRN

## 2021-07-15 MED ORDER — ONDANSETRON HCL 4 MG/2ML IJ SOLN
INTRAMUSCULAR | Status: DC | PRN
  Administered 2021-07-15: 4 mg via INTRAVENOUS

## 2021-07-15 MED ORDER — LEVOTHYROXINE SODIUM 25 MCG PO TABS
25.0000 ug | ORAL_TABLET | Freq: Every day | ORAL | Status: DC
  Administered 2021-07-16 – 2021-07-20 (×5): 25 ug via ORAL
  Filled 2021-07-15 (×5): qty 1

## 2021-07-15 MED ORDER — METHOCARBAMOL 500 MG PO TABS
1000.0000 mg | ORAL_TABLET | Freq: Four times a day (QID) | ORAL | Status: DC | PRN
Start: 2021-07-15 — End: 2021-07-18

## 2021-07-15 MED ORDER — VITAMIN D (ERGOCALCIFEROL) 1.25 MG (50000 UNIT) PO CAPS
50000.0000 [IU] | ORAL_CAPSULE | ORAL | Status: DC
  Administered 2021-07-17: 50000 [IU] via ORAL
  Filled 2021-07-15: qty 1

## 2021-07-15 MED ORDER — ALPRAZOLAM 0.25 MG PO TABS: 0.2500 mg | ORAL_TABLET | Freq: Every day | ORAL | Status: DC | PRN

## 2021-07-15 MED ORDER — ADULT MULTIVITAMIN W/MINERALS CH
1.0000 | ORAL_TABLET | Freq: Every day | ORAL | Status: DC
  Administered 2021-07-16 – 2021-07-20 (×5): 1 via ORAL
  Filled 2021-07-15 (×5): qty 1

## 2021-07-15 MED ORDER — BUPIVACAINE-EPINEPHRINE (PF) 0.25% -1:200000 IJ SOLN
INTRAMUSCULAR | Status: AC
  Filled 2021-07-15: qty 60

## 2021-07-15 MED ORDER — TRAZODONE HCL 50 MG PO TABS
50.0000 mg | ORAL_TABLET | Freq: Every day | ORAL | Status: DC
  Administered 2021-07-16 – 2021-07-17 (×2): 50 mg via ORAL
  Filled 2021-07-15 (×2): qty 1

## 2021-07-15 MED ORDER — PROMETHAZINE HCL 25 MG RE SUPP: 25.0000 mg | Freq: Four times a day (QID) | RECTAL | 5 refills | Status: DC | PRN

## 2021-07-15 MED ORDER — METRONIDAZOLE 500 MG/100ML IV SOLN
500.0000 mg | INTRAVENOUS | Status: AC
  Administered 2021-07-15: 500 mg via INTRAVENOUS
  Filled 2021-07-15: qty 100

## 2021-07-15 MED ORDER — ALBUMIN HUMAN 5 % IV SOLN: INTRAVENOUS | Status: DC | PRN

## 2021-07-15 MED ORDER — CHLORHEXIDINE GLUCONATE CLOTH 2 % EX PADS
6.0000 | MEDICATED_PAD | Freq: Once | CUTANEOUS | Status: DC
Start: 2021-07-15 — End: 2021-07-15

## 2021-07-15 MED ORDER — SODIUM CHLORIDE 0.9 % IV SOLN: 250.0000 mL | INTRAVENOUS | Status: DC | PRN

## 2021-07-15 MED ORDER — BUPROPION HCL ER (XL) 300 MG PO TB24
300.0000 mg | ORAL_TABLET | Freq: Every day | ORAL | Status: DC
  Administered 2021-07-16 – 2021-07-19 (×4): 300 mg via ORAL
  Filled 2021-07-15 (×5): qty 1

## 2021-07-15 MED ORDER — POLYETHYLENE GLYCOL 3350 17 G PO PACK
17.0000 g | PACK | Freq: Two times a day (BID) | ORAL | Status: DC
  Administered 2021-07-17: 17 g via ORAL
  Filled 2021-07-15 (×7): qty 1

## 2021-07-15 MED ORDER — ARIPIPRAZOLE 2 MG PO TABS
2.0000 mg | ORAL_TABLET | Freq: Every day | ORAL | Status: DC
  Administered 2021-07-16 – 2021-07-18 (×3): 2 mg via ORAL
  Filled 2021-07-15 (×3): qty 1

## 2021-07-15 MED ORDER — MAGNESIUM HYDROXIDE 400 MG/5ML PO SUSP: 30.0000 mL | Freq: Every day | ORAL | Status: DC | PRN

## 2021-07-15 MED ORDER — CHLORHEXIDINE GLUCONATE 0.12 % MT SOLN: 15.0000 mL | Freq: Once | OROMUCOSAL | Status: AC

## 2021-07-15 MED ORDER — GABAPENTIN 300 MG PO CAPS: 300.0000 mg | ORAL_CAPSULE | Freq: Two times a day (BID) | ORAL | Status: DC

## 2021-07-15 MED ORDER — ENOXAPARIN SODIUM 40 MG/0.4ML IJ SOSY
40.0000 mg | PREFILLED_SYRINGE | INTRAMUSCULAR | Status: DC
  Administered 2021-07-16 – 2021-07-20 (×5): 40 mg via SUBCUTANEOUS
  Filled 2021-07-15 (×5): qty 0.4

## 2021-07-15 MED ORDER — SERTRALINE HCL 50 MG PO TABS
50.0000 mg | ORAL_TABLET | Freq: Every day | ORAL | Status: DC
  Administered 2021-07-16 – 2021-07-20 (×5): 50 mg via ORAL
  Filled 2021-07-15 (×5): qty 1

## 2021-07-15 MED ORDER — ACETAMINOPHEN 500 MG PO TABS
1000.0000 mg | ORAL_TABLET | ORAL | Status: AC
  Administered 2021-07-15: 1000 mg via ORAL

## 2021-07-15 MED ORDER — METHOCARBAMOL 500 MG PO TABS
500.0000 mg | ORAL_TABLET | Freq: Four times a day (QID) | ORAL | Status: DC | PRN
  Administered 2021-07-17: 500 mg via ORAL
  Filled 2021-07-15: qty 1

## 2021-07-15 MED ORDER — METOPROLOL TARTRATE 25 MG PO TABS
25.0000 mg | ORAL_TABLET | Freq: Two times a day (BID) | ORAL | Status: DC
  Administered 2021-07-16 – 2021-07-20 (×9): 25 mg via ORAL
  Filled 2021-07-15 (×10): qty 1

## 2021-07-15 MED ORDER — LIDOCAINE 2% (20 MG/ML) 5 ML SYRINGE
INTRAMUSCULAR | Status: DC | PRN
  Administered 2021-07-15: 40 mg via INTRAVENOUS

## 2021-07-15 MED ORDER — TRAMADOL HCL 50 MG PO TABS
50.0000 mg | ORAL_TABLET | Freq: Four times a day (QID) | ORAL | Status: DC | PRN
  Administered 2021-07-16 – 2021-07-17 (×3): 50 mg via ORAL
  Filled 2021-07-15 (×3): qty 1

## 2021-07-15 MED ORDER — LIDOCAINE 2% (20 MG/ML) 5 ML SYRINGE
INTRAMUSCULAR | Status: DC | PRN
  Administered 2021-07-15: 1.5 mg/kg/h via INTRAVENOUS

## 2021-07-15 MED ORDER — EPHEDRINE 5 MG/ML INJ
INTRAVENOUS | Status: AC
  Filled 2021-07-15: qty 5

## 2021-07-15 MED ORDER — GABAPENTIN 250 MG/5ML PO SOLN
300.0000 mg | Freq: Once | ORAL | Status: DC
  Filled 2021-07-15: qty 6

## 2021-07-15 MED ORDER — DIPHENHYDRAMINE HCL 50 MG/ML IJ SOLN: 12.5000 mg | Freq: Four times a day (QID) | INTRAMUSCULAR | Status: DC | PRN

## 2021-07-15 MED ORDER — SODIUM CHLORIDE 0.9% FLUSH: 3.0000 mL | INTRAVENOUS | Status: DC | PRN

## 2021-07-15 MED ORDER — POLYETHYLENE GLYCOL 3350 17 G PO PACK: 17.0000 g | PACK | Freq: Every day | ORAL | Status: DC | PRN

## 2021-07-15 SURGICAL SUPPLY — 70 items
APPLICATOR COTTON TIP 6 STRL (MISCELLANEOUS) ×1 IMPLANT
APPLICATOR COTTON TIP 6IN STRL (MISCELLANEOUS)
APPLIER CLIP 5 13 M/L LIGAMAX5 (MISCELLANEOUS)
BAG COUNTER SPONGE SURGICOUNT (BAG) ×2 IMPLANT
BAG SURGICOUNT SPONGE COUNTING (BAG) ×1
BLADE SURG SZ11 CARB STEEL (BLADE) ×3 IMPLANT
CHLORAPREP W/TINT 26 (MISCELLANEOUS) ×3 IMPLANT
CLIP APPLIE 5 13 M/L LIGAMAX5 (MISCELLANEOUS) IMPLANT
COVER SURGICAL LIGHT HANDLE (MISCELLANEOUS) ×3 IMPLANT
COVER TIP SHEARS 8 DVNC (MISCELLANEOUS) IMPLANT
COVER TIP SHEARS 8MM DA VINCI (MISCELLANEOUS) ×2
DEFOGGER SCOPE WARMER CLEARIFY (MISCELLANEOUS) ×2 IMPLANT
DRAIN CHANNEL 19F RND (DRAIN) ×2 IMPLANT
DRAIN PENROSE 0.5X18 (DRAIN) ×2 IMPLANT
DRAPE ARM DVNC X/XI (DISPOSABLE) ×4 IMPLANT
DRAPE COLUMN DVNC XI (DISPOSABLE) ×1 IMPLANT
DRAPE DA VINCI XI ARM (DISPOSABLE) ×8
DRAPE DA VINCI XI COLUMN (DISPOSABLE) ×2
DRAPE WARM FLUID 44X44 (DRAPES) ×3 IMPLANT
DRSG TEGADERM 2-3/8X2-3/4 SM (GAUZE/BANDAGES/DRESSINGS) ×15 IMPLANT
ELECT REM PT RETURN 15FT ADLT (MISCELLANEOUS) ×3 IMPLANT
ENDOLOOP SUT PDS II  0 18 (SUTURE)
ENDOLOOP SUT PDS II 0 18 (SUTURE) IMPLANT
EVACUATOR DRAINAGE 10X20 100CC (DRAIN) IMPLANT
EVACUATOR SILICONE 100CC (DRAIN) ×2 IMPLANT
FELT TEFLON 4 X1 (Mesh General) ×3 IMPLANT
GAUZE SPONGE 2X2 8PLY STRL LF (GAUZE/BANDAGES/DRESSINGS) ×1 IMPLANT
GLOVE SURG NEOPR MICRO LF SZ8 (GLOVE) ×6 IMPLANT
GLOVE SURG UNDER LTX SZ8 (GLOVE) ×6 IMPLANT
GOWN STRL REUS W/ TWL XL LVL3 (GOWN DISPOSABLE) ×1 IMPLANT
GOWN STRL REUS W/TWL XL LVL3 (GOWN DISPOSABLE) ×11 IMPLANT
GRASPER SUT TROCAR 14GX15 (MISCELLANEOUS) IMPLANT
IRRIG SUCT STRYKERFLOW 2 WTIP (MISCELLANEOUS) ×3
IRRIGATION SUCT STRKRFLW 2 WTP (MISCELLANEOUS) ×1 IMPLANT
KIT BASIN OR (CUSTOM PROCEDURE TRAY) ×3 IMPLANT
KIT TURNOVER KIT A (KITS) ×2 IMPLANT
MESH PHASIX RESORB RECT 10X15 (Mesh General) ×2 IMPLANT
NDL INSUFFLATION 14GA 150MM (NEEDLE) IMPLANT
NEEDLE HYPO 22GX1.5 SAFETY (NEEDLE) ×3 IMPLANT
NEEDLE INSUFFLATION 14GA 150MM (NEEDLE) ×3 IMPLANT
PACK CARDIOVASCULAR III (CUSTOM PROCEDURE TRAY) ×3 IMPLANT
PAD POSITIONING PINK XL (MISCELLANEOUS) ×3 IMPLANT
PENCIL SMOKE EVACUATOR (MISCELLANEOUS) IMPLANT
SCISSORS LAP 5X45 EPIX DISP (ENDOMECHANICALS) IMPLANT
SEAL CANN UNIV 5-8 DVNC XI (MISCELLANEOUS) ×4 IMPLANT
SEAL XI 5MM-8MM UNIVERSAL (MISCELLANEOUS) ×8
SEALER VESSEL DA VINCI XI (MISCELLANEOUS) ×2
SEALER VESSEL EXT DVNC XI (MISCELLANEOUS) ×1 IMPLANT
SOLUTION ELECTROLUBE (MISCELLANEOUS) ×3 IMPLANT
SPIKE FLUID TRANSFER (MISCELLANEOUS) ×1 IMPLANT
SPONGE GAUZE 2X2 STER 10/PKG (GAUZE/BANDAGES/DRESSINGS) ×2
SPONGE T-LAP 18X18 ~~LOC~~+RFID (SPONGE) ×3 IMPLANT
STOPCOCK 4 WAY LG BORE MALE ST (IV SETS) ×6 IMPLANT
SUT ETHIBOND 0 36 GRN (SUTURE) ×6 IMPLANT
SUT ETHIBOND NAB CT1 #1 30IN (SUTURE) ×9 IMPLANT
SUT MNCRL AB 4-0 PS2 18 (SUTURE) ×3 IMPLANT
SUT PROLENE 2 0 SH DA (SUTURE) ×2 IMPLANT
SUT V-LOC BARB 180 2/0GR6 GS22 (SUTURE) ×3
SUT VICRYL 0 TIES 12 18 (SUTURE) IMPLANT
SUT VICRYL 0 UR6 27IN ABS (SUTURE) IMPLANT
SUT VLOC 180 2-0 9IN GS21 (SUTURE) ×2 IMPLANT
SUTURE V-LC BRB 180 2/0GR6GS22 (SUTURE) IMPLANT
SYR 10ML LL (SYRINGE) ×3 IMPLANT
SYR 20ML LL LF (SYRINGE) ×3 IMPLANT
TOWEL OR 17X26 10 PK STRL BLUE (TOWEL DISPOSABLE) ×3 IMPLANT
TOWEL OR NON WOVEN STRL DISP B (DISPOSABLE) ×3 IMPLANT
TRAY FOLEY MTR SLVR 14FR STAT (SET/KITS/TRAYS/PACK) IMPLANT
TRAY FOLEY MTR SLVR 16FR STAT (SET/KITS/TRAYS/PACK) IMPLANT
TROCAR ADV FIXATION 5X100MM (TROCAR) ×3 IMPLANT
TUBING INSUFFLATION 10FT LAP (TUBING) ×3 IMPLANT

## 2021-07-15 NOTE — Discharge Instructions (Signed)
EATING AFTER YOUR ESOPHAGEAL SURGERY (Stomach Fundoplication, Hiatal Hernia repair, Achalasia surgery, etc)  ######################################################################  EAT Start with a pureed / full liquid diet (see below) Gradually transition to a high fiber diet with a fiber supplement over the next month after discharge.    WALK Walk an hour a day.  Control your pain to do that.    CONTROL PAIN Control pain so that you can walk, sleep, tolerate sneezing/coughing, go up/down stairs.  HAVE A BOWEL MOVEMENT DAILY Keep your bowels regular to avoid problems.  OK to try a laxative to override constipation.  OK to use an antidairrheal to slow down diarrhea.  Call if not better after 2 tries  CALL IF YOU HAVE PROBLEMS/CONCERNS Call if you are still struggling despite following these instructions. Call if you have concerns not answered by these instructions  ######################################################################   After your esophageal surgery, expect some sticking with swallowing over the next 1-2 months.    If food sticks when you eat, it is called "dysphagia".  This is due to swelling around your esophagus at the wrap & hiatal diaphragm repair.  It will gradually ease off over the next few months.  To help you through this temporary phase, we start you out on a pureed (blenderized) diet.  Your first meal in the hospital was thin liquids.  You should have been given a pureed diet by the time you left the hospital.  We ask patients to stay on a pureed diet for the first 2-3 weeks to avoid anything getting "stuck" near your recent surgery.  Don't be alarmed if your ability to swallow doesn't progress according to this plan.  Everyone is different and some diets can advance more or less quickly.    It is often helpful to crush your medications or split them as they can sometimes stick, especially the first week or so.   Some BASIC RULES to follow  are:  Maintain an upright position whenever eating or drinking.  Take small bites - just a teaspoon size bite at a time.  Eat slowly.  It may also help to eat only one food at a time.  Consider nibbling through smaller, more frequent meals & avoid the urge to eat BIG meals  Do not push through feelings of fullness, nausea, or bloatedness  Do not mix solid foods and liquids in the same mouthful  Try not to "wash foods down" with large gulps of liquids.  Avoid carbonated (bubbly/fizzy) drinks.    Avoid foods that make you feel gassy or bloated.  Start with bland foods first.  Wait on trying greasy, fried, or spicy meals until you are tolerating more bland solids well.  Understand that it will be hard to burp and belch at first.  This gradually improves with time.  Expect to be more gassy/flatulent/bloated initially.  Walking will help your body manage it better.  Consider using medications for bloating that contain simethicone such as  Maalox or Gas-X   Consider crushing her medications, especially smaller pills.  The ability to swallow pills should get easier after a few weeks  Eat in a relaxed atmosphere & minimize distractions.  Avoid talking while eating.    Do not use straws.  Following each meal, sit in an upright position (90 degree angle) for 60 to 90 minutes.  Going for a short walk can help as well  If food does stick, don't panic.  Try to relax and let the food pass on its own.    Sipping WARM LIQUID such as strong hot black tea can also help slide it down.   Be gradual in changes & use common sense:  -If you easily tolerating a certain "level" of foods, advance to the next level gradually -If you are having trouble swallowing a particular food, then avoid it.   -If food is sticking when you advance your diet, go back to thinner previous diet (the lower LEVEL) for 1-2 days.  LEVEL 1 = PUREED DIET  Do for the first 2 WEEKS AFTER SURGERY  -Foods in this group are  pureed or blenderized to a smooth, mashed potato-like consistency.  -If necessary, the pureed foods can keep their shape with the addition of a thickening agent.   -Meat should be pureed to a smooth, pasty consistency.  Hot broth or gravy may be added to the pureed meat, approximately 1 oz. of liquid per 3 oz. serving of meat. -CAUTION:  If any foods do not puree into a smooth consistency, swallowing will be more difficult.  (For example, nuts or seeds sometimes do not blend well.)  Hot Foods Cold Foods  Pureed scrambled eggs and cheese Pureed cottage cheese  Baby cereals Thickened juices and nectars  Thinned cooked cereals (no lumps) Thickened milk or eggnog  Pureed French toast or pancakes Ensure  Mashed potatoes Ice cream  Pureed parsley, au gratin, scalloped potatoes, candied sweet potatoes Fruit or Italian ice, sherbet  Pureed buttered or alfredo noodles Plain yogurt  Pureed vegetables (no corn or peas) Instant breakfast  Pureed soups and creamed soups Smooth pudding, mousse, custard  Pureed scalloped apples Whipped gelatin  Gravies Sugar, syrup, honey, jelly  Sauces, cheese, tomato, barbecue, white, creamed Cream  Any baby food Creamer  Alcohol in moderation (not beer or champagne) Margarine  Coffee or tea Mayonnaise   Ketchup, mustard   Apple sauce   SAMPLE MENU:  PUREED DIET Breakfast Lunch Dinner   Orange juice, 1/2 cup  Cream of wheat, 1/2 cup  Pineapple juice, 1/2 cup  Pureed turkey, barley soup, 3/4 cup  Pureed Hawaiian chicken, 3 oz   Scrambled eggs, mashed or blended with cheese, 1/2 cup  Tea or coffee, 1 cup   Whole milk, 1 cup   Non-dairy creamer, 2 Tbsp.  Mashed potatoes, 1/2 cup  Pureed cooled broccoli, 1/2 cup  Apple sauce, 1/2 cup  Coffee or tea  Mashed potatoes, 1/2 cup  Pureed spinach, 1/2 cup  Frozen yogurt, 1/2 cup  Tea or coffee      LEVEL 2 = SOFT DIET  After your first 2 weeks, you can advance to a soft diet.   Keep on this  diet until everything goes down easily.  Hot Foods Cold Foods  White fish Cottage cheese  Stuffed fish Junior baby fruit  Baby food meals Semi thickened juices  Minced soft cooked, scrambled, poached eggs nectars  Souffle & omelets Ripe mashed bananas  Cooked cereals Canned fruit, pineapple sauce, milk  potatoes Milkshake  Buttered or Alfredo noodles Custard  Cooked cooled vegetable Puddings, including tapioca  Sherbet Yogurt  Vegetable soup or alphabet soup Fruit ice, Italian ice  Gravies Whipped gelatin  Sugar, syrup, honey, jelly Junior baby desserts  Sauces:  Cheese, creamed, barbecue, tomato, white Cream  Coffee or tea Margarine   SAMPLE MENU:  LEVEL 2 Breakfast Lunch Dinner   Orange juice, 1/2 cup  Oatmeal, 1/2 cup  Scrambled eggs with cheese, 1/2 cup  Decaffeinated tea, 1 cup  Whole milk, 1 cup    Mashed potatoes, 1/2 cup °Cooked spinach, 1/2 cup °Frozen yogurt, 1/2 cup °Non-dairy creamer, 2 Tbsp  ° ° ° ° °LEVEL 3 = CHOPPED DIET ° °-After all the foods in level 2 (soft diet) are passing through well you should advance up to more chopped foods.  °-It is still important to cut these foods into small pieces and eat slowly. ° °Hot Foods Cold Foods  °Poultry Cottage cheese  °Chopped Swedish meatballs Yogurt  °Meat salads (ground or flaked meat) Milk  °Flaked fish (tuna) Milkshakes  °Poached or scrambled eggs Soft, cold, dry cereal  °Souffles and omelets Fruit juices or nectars  °Cooked cereals Chopped canned fruit  °Chopped French toast or pancakes Canned fruit cocktail  °Noodles or pasta (no rice) Pudding, mousse, custard  °Cooked vegetables (no frozen peas, corn, or mixed vegetables) Green salad  °Canned small sweet peas Ice cream  °Creamed soup or vegetable soup Fruit ice, Italian ice  °Pureed vegetable soup or alphabet soup  Non-dairy creamer  °Ground scalloped apples Margarine  °Gravies Mayonnaise  °Sauces:  Cheese, creamed, barbecue, tomato, white Ketchup  °Coffee or tea Mustard  ° °SAMPLE MENU:  LEVEL 3 °Breakfast Lunch Dinner  °Orange juice, 1/2 cup °Oatmeal, 1/2 cup °Scrambled eggs with cheese, 1/2 cup °Decaffeinated tea, 1 cup °Whole milk, 1 cup °Non-dairy creamer, 2 Tbsp °Ketchup, 1 Tbsp °Margarine, 1 tsp °Salt, 1/4 tsp °Sugar, 2 tsp Pineapple juice, 1/2 cup °Ground beef, 3 oz °Gravy, 2 Tbsp °Mashed potatoes, 1/2 cup °Cooked spinach, 1/2 cup °Applesauce, 1/2 cup °Decaffeinated coffee °Whole milk °Non-dairy creamer, 2 Tbsp °Margarine, 1 tsp °Salt, 1/4 tsp Pureed turkey, barley soup, 3/4 cup °Barbecue chicken, 3 oz °Mashed potatoes, 1/2 cup °Ground fresh broccoli, 1/2 cup °Frozen yogurt, 1/2 cup °Decaffeinated tea, 1 cup °Non-dairy creamer, 2 Tbsp °Margarine, 1 tsp °Salt, 1/4 tsp °Sugar, 1 tsp  ° ° °LEVEL 4:  REGULAR FOODS ° °-Foods in this group are soft, moist, regularly textured foods.   °-This level includes meat and breads, which tend to be the hardest things to swallow.   °-Eat very slowly, chew well and continue to avoid carbonated drinks. °-most people are at this level in 4-6 weeks ° °Hot Foods Cold Foods  °Baked fish or skinned Soft cheeses - cottage cheese  °Souffles and omelets Cream cheese  °Eggs Yogurt  °Stuffed shells Milk  °Spaghetti with meat sauce Milkshakes  °Cooked cereal Cold dry cereals (no nuts, dried fruit, coconut)  °French toast or pancakes Crackers  °Buttered toast Fruit juices or nectars  °Noodles or pasta (no rice) Canned fruit  °Potatoes (all types) Ripe bananas  °Soft, cooked vegetables (no corn, lima, or baked beans) Peeled, ripe, fresh fruit  °Creamed soups or vegetable soup Cakes (no nuts, dried fruit, coconut)  °Canned chicken noodle soup Plain doughnuts  °Gravies Ice cream  °Bacon dressing Pudding, mousse, custard  °Sauces:  Cheese, creamed, barbecue, tomato, white Fruit ice, Italian ice, sherbet   °Decaffeinated tea or coffee Whipped gelatin  °Pork chops Regular gelatin  ° Canned fruited gelatin molds  ° Sugar, syrup, honey, jam, jelly  ° Cream  ° Non-dairy  ° Margarine  ° Oil  ° Mayonnaise  ° Ketchup  ° Mustard  ° °TROUBLESHOOTING IRREGULAR BOWELS  °1) Avoid extremes of bowel movements (no bad constipation/diarrhea)  °2) Miralax 17gm mixed in 8oz. water or juice-daily. May use BID as needed.  °3) Gas-x,Phazyme, etc. as needed for gas & bloating.  °4) Soft,bland diet. No spicy,greasy,fried foods.  °5) Prilosec over-the-counter   as needed  6) May hold gluten/wheat products from diet to see if symptoms improve.  7) May try probiotics (Align, Activa, etc) to help calm the bowels down  7) If symptoms become worse call back immediately.    If you have any questions please call our office at [facility]: [phone].    ########################################   ################################################################  LAPAROSCOPIC SURGERY: POST OP INSTRUCTIONS  ######################################################################  EAT Gradually transition to a high fiber diet with a fiber supplement over the next few weeks after discharge.  Start with a pureed / full liquid diet (see below)  WALK Walk an hour a day.  Control your pain to do that.    CONTROL PAIN Control pain so that you can walk, sleep, tolerate sneezing/coughing, go up/down stairs.  HAVE A BOWEL MOVEMENT DAILY Keep your bowels regular to avoid problems.  OK to try a laxative to override constipation.  OK to use an antidairrheal to slow down diarrhea.  Call if not better after 2 tries  CALL IF YOU HAVE PROBLEMS/CONCERNS Call if you are still struggling despite following these instructions. Call if you have concerns not answered by these instructions  ######################################################################    DIET: Follow a light bland diet & liquids the first 24 hours  after arrival home, such as soup, liquids, starches, etc.  Be sure to drink plenty of fluids.  Quickly advance to a usual solid diet within a few days.  Avoid fast food or heavy meals as your are more likely to get nauseated or have irregular bowels.  A low-fat, high-fiber diet for the rest of your life is ideal.  Take your usually prescribed home medications unless otherwise directed.  PAIN CONTROL: Pain is best controlled by a usual combination of three different methods TOGETHER: Ice/Heat Over the counter pain medication Prescription pain medication Most patients will experience some swelling and bruising around the incisions.  Ice packs or heating pads (30-60 minutes up to 6 times a day) will help. Use ice for the first few days to help decrease swelling and bruising, then switch to heat to help relax tight/sore spots and speed recovery.  Some people prefer to use ice alone, heat alone, alternating between ice & heat.  Experiment to what works for you.  Swelling and bruising can take several weeks to resolve.   It is helpful to take an over-the-counter pain medication regularly for the first few weeks.  Choose one of the following that works best for you: Naproxen (Aleve, etc)  Two  tabs twice a day Ibuprofen (Advil, etc) Three  tabs four times a day (every meal & bedtime) Acetaminophen (Tylenol, etc) 500-650mg  four times a day (every meal & bedtime) A  prescription for pain medication (such as oxycodone, hydrocodone, tramadol, gabapentin, methocarbamol, etc) should be given to you upon discharge.  Take your pain medication as prescribed.  If you are having problems/concerns with the prescription medicine (does not control pain, nausea, vomiting, rash, itching, etc), please call us [phone] to see if we need to switch you to a different pain medicine that will work better for you and/or control your side effect better. If you need a refill on your pain medication, please give [place]  48 hour notice.  contact your pharmacy.  They will contact our office to request authorization. Prescriptions will not be filled after 5 pm or on week-ends  Avoid getting constipated.   Between the surgery and the pain medications, it is common to experience some constipation.  Increasing fluid intake and taking a fiber supplement (such as Metamucil, Citrucel, FiberCon, MiraLax, etc) 1-2 times a day regularly will usually help prevent this problem from occurring.   A mild laxative (prune juice, Milk of Magnesia, MiraLax, etc) should be taken according to package directions if there are no bowel movements after 48 hours.   Watch out for diarrhea.   If you have many loose bowel movements, simplify your diet to bland foods & liquids for a few days.   Stop any stool softeners and decrease your fiber supplement.   Switching to mild anti-diarrheal medications (Kayopectate, Pepto Bismol) can help.   If this worsens or does not improve, please call us.  Wash / shower every day.  You may shower over the dressings as they are waterproof.  Continue to shower over incision(s) after the dressing is off.  REMOVE ALL DRESSINGS: Remove your waterproof bandages (tegaderm clear band-aids, steristrip skin tapes, etc) THREE DAYS AFTER SURGERY.  You may leave the incisions open to air.  You may replace a dressing/Band-Aid to cover the incision for comfort if you wish.   ACTIVITIES as tolerated:   You may resume regular (light) daily activities beginning the next day--such as daily self-care, walking, climbing stairs--gradually increasing activities as tolerated.  If you can walk 30 minutes without difficulty, it is safe to try more intense activity such as jogging, treadmill, bicycling, low-impact aerobics, swimming, etc. Save the most intensive and strenuous activity for last such as sit-ups, heavy lifting, contact sports, etc  Refrain from any heavy lifting or straining until you are off narcotics for pain control.    DO NOT PUSH THROUGH PAIN.  Let pain be your guide: If it hurts to do something, don't do it.  Pain is your body warning you to avoid that activity for another week until the pain goes down. You may drive when you are no longer taking prescription pain medication, you can comfortably wear a seatbelt, and you can safely maneuver your car and apply brakes. You may have sexual intercourse when it is comfortable.  FOLLOW UP in our office Please call [facility] at [phone] to set up an appointment to see your surgeon in the office for a follow-up appointment approximately 2-3 weeks after your surgery. Make sure that you call for this appointment the day you arrive home to insure a convenient appointment time.  10. IF YOU HAVE DISABILITY OR FAMILY LEAVE FORMS, BRING THEM TO THE OFFICE FOR PROCESSING.  DO NOT GIVE THEM TO YOUR DOCTOR.   WHEN TO CALL us [phone]: Poor pain control Reactions / problems with new medications (rash/itching, nausea, etc)  Fever over 101.5 F (38.5 C) Inability to urinate Nausea and/or vomiting Worsening swelling or bruising Continued bleeding from incision. Increased pain, redness, or drainage from the incision   The clinic staff is available to answer your questions during regular business hours (8:30am-5pm).  Please dont hesitate to call and ask to speak to one of our nurses for clinical concerns.   If you have a medical emergency, go to the nearest emergency room or call 911.  A surgeon from [facility] is always on call at the [facility], [place] [address], [place], [place]  [address] ? MAIN: [phone] ? TOLL FREE: [phone] ?  FAX [phone] [email or web address]  ##############################################################

## 2021-07-15 NOTE — Anesthesia Postprocedure Evaluation (Signed)
Anesthesia Post Note ? ?Patient: Melanie CrazierJoyce Manz ? ?Procedure(s) Performed: robotic paraesophageal hiatal hernia with fundoplication, diaphragm release and mesh repair and bilateral TAP block (Abdomen) ? ?  ? ?Patient location during evaluation: PACU ?Anesthesia Type: General ?Level of consciousness: awake and alert ?Pain management: pain level controlled ?Vital Signs Assessment: post-procedure vital signs reviewed and stable ?Respiratory status: spontaneous breathing, nonlabored ventilation, respiratory function stable and patient connected to nasal cannula oxygen ?Cardiovascular status: blood pressure returned to baseline and stable ?Postop Assessment: no apparent nausea or vomiting ?Anesthetic complications: no ? ? ?No notable events documented. ? ?Last Vitals:  ?Vitals:  ? 07/15/21 1352 07/15/21 1500  ?BP: (!) 153/76 113/67  ?Pulse: (!) 57 (!) 57  ?Resp: 15 16  ?Temp: 36.4 ?C 36.8 ?C  ?SpO2: 96% 92%  ?  ?Last Pain:  ?Vitals:  ? 07/15/21 1500  ?TempSrc: Oral  ?PainSc:   ? ? ?  ?  ?  ?  ?  ?  ? ?Kaedynce Tapp L Josey Forcier ? ? ? ? ?

## 2021-07-15 NOTE — H&P (Signed)
07/15/2021    REFERRING PHYSICIAN: Eliezer BottomStark, Malcolm T Jr., MD  Patient Care Team: Gordan PaymentGrisso, Greg A, MD as PCP - General (Internal Medicine) Eliezer BottomStark, Malcolm T Jr., MD (Gastroenterology) Belva Cromeevankar, Rajan, MD (Cardiovascular Disease)  PROVIDER: Jarrett SohoSTEVEN CUMMINS Amahia Madonia, MD  [facility] MRN: [id number] DOB: [birth year] 07/15/2021   SUBJECTIVE   Chief Complaint: Hiatal Hernia   History of Present Illness: Deborah Farley is a 76 y.o. female who is seen today  as an office consultation at the request of Dr. Russella DarStark  for evaluation of Hiatal Hernia .   Pleasant woman that is struggled with heartburn reflux and hiatal hernia for many decades. Has been documented to have hiatal hernia for many years. Normally takes omeprazole. That usually controls most heartburn and reflux issues. However she has noted increasing episodes of chest pain and nausea and vomiting. Has had care in [place] and other regions. Recently established with [facility]. She has had prior upper endoscopies that have documented esophageal strictures requiring dilatation. She is due for an EGD next week. Given the fact that her last upper GI fluoroscopic study revealed her entire stomach flipped up into her chest, surgical consultation recommended by gastrology. She comes in in today not knowing why she is here but somewhat recalls being told this from the GI office.  She is here today with her husband. She had a hysterectomy and no other abdominal surgeries. She seems to have more dysphagia to solids than liquids. She is having remittent nausea and vomiting episodes every week now. She has been followed by cardiology in the past since there were concerns that she might of had cardiac issues. Had a catheterization that looked relatively underwhelming in 2021. Following to the [facility]. She can walk maybe a block or 2 before she gets rather short of breath. No exertional chest pain. She had a work-up concerning for  possible pulmonary embolism but VQ scan and CT were negative. She does have diabetes but her hemoglobin A1c has been in the mid fives and she is not needed any medications. She is never smoked. She does not have sleep apnea. She does have to sleep up on pillows as she gets pressure discomfort and shortness of breath if she lies supine. Her heartburn and reflux usually is pretty well controlled with the omeprazole twice a day but the nausea vomiting and chest pain has not been.  Medical History:  Past Medical History:  Diagnosis Date   Anxiety   Arthritis   Diabetes mellitus without complication (CMS-HCC)   GERD (gastroesophageal reflux disease)   Glaucoma (increased eye pressure)   Hyperlipidemia   Hypertension   Thyroid disease   Patient Active Problem List  Diagnosis   GERD (gastroesophageal reflux disease)   Esophageal dysphagia   Abdominal pain, epigastric   Incarcerated hiatal hernia   Organoaxial gastric volvulus   Nausea and vomiting   DOE (dyspnea on exertion)   BMI 37.0-37.9, adult   Past Surgical History:  Procedure Laterality Date   cataracts   esophageal stricture  2000's x 2   HYSTERECTOMY   THORACIC SPINE SURGERY    Allergies  Allergen Reactions   Codeine Shortness Of Breath   Iodine Anaphylaxis   Current Outpatient Medications on File Prior to Visit  Medication Sig Dispense Refill   aspirin 81 MG chewable tablet Take 81 mg by mouth once daily.   bumetanide (BUMEX) 1 MG tablet Take 1 mg by mouth once daily.   buPROPion (WELLBUTRIN SR) 100 MG SR tablet  Take 100 mg by mouth 2 (two) times daily.   FOLIC ACID ORAL Take 400 mg by mouth once daily.   isosorbide mononitrate (IMDUR) 30 MG ER tablet Take 30 mg by mouth once daily.   levothyroxine (SYNTHROID, LEVOTHROID) 25 MCG tablet Take 25 mcg by mouth once daily. Take on an empty stomach with a glass of water at least 30-60 minutes before breakfast.   methotrexate 25 mg/mL injection Take 10 mg by mouth once a  week.   metoprolol tartrate (LOPRESSOR) 25 MG tablet Take 25 mg by mouth 2 (two) times daily.   multivitamin tablet Take 1 tablet by mouth once daily.   omeprazole (PRILOSEC) 20 MG DR capsule Take 20 mg by mouth 2 (two) times daily.   polyethylene glycol (MIRALAX) powder Take 17 g by mouth once daily as needed for Constipation. Mix in 4-8ounces of fluid prior to taking.   pravastatin (PRAVACHOL) 10 MG tablet Take 10 mg by mouth nightly.   sertraline (ZOLOFT) 25 MG tablet Take 50 mg by mouth once daily.   traMADol (ULTRAM) 50 mg tablet Take 50 mg by mouth every 6 (six) hours as needed for Pain.   traZODone (DESYREL) 50 MG tablet Take 50 mg by mouth nightly.   cyanocobalamin, vitamin B-12, 5,000 mcg TbIE Take 6,000 mcg by mouth once daily. (Patient not taking: Reported on 05/17/2021)   enalapril (VASOTEC) 20 MG tablet Take 20 mg by mouth once daily. (Patient not taking: Reported on 05/17/2021)   No current facility-administered medications on file prior to visit.   Family History  Problem Relation Age of Onset   High blood pressure (Hypertension) Mother   Diabetes Mother   Stroke Father   Obesity Father   Coronary Artery Disease (Blocked arteries around heart) Father   Obesity Sister   High blood pressure (Hypertension) Sister   Hyperlipidemia (Elevated cholesterol) Sister   Diabetes Sister   Deep vein thrombosis (DVT or abnormal blood clot formation) Sister    Social History   Tobacco Use  Smoking Status Never  Smokeless Tobacco Never    Social History   Socioeconomic History   Marital status: Married  Tobacco Use   Smoking status: Never   Smokeless tobacco: Never  Substance and Sexual Activity   Alcohol use: Yes   Drug use: Not Currently   ############################################################  Review of Systems: A complete review of systems (ROS) was obtained from the patient. I have reviewed this information and discussed as appropriate with the patient. See HPI  as well for other pertinent ROS.  Constitutional: No fevers, chills, sweats. Weight stable Eyes: No vision changes, No discharge HENT: No sore throats, nasal drainage Lymph: No neck swelling, No bruising easily Pulmonary: No cough, productive sputum CV: No orthopnea, PND Patient walks 10 minutes but slowly given shortness of breath. No exertional chest/neck/shoulder/arm pain.  GI: No personal nor family history of GI/colon cancer, inflammatory bowel disease, irritable bowel syndrome, allergy such as Celiac Sprue, dietary/dairy problems, colitis, ulcers nor gastritis. No recent sick contacts/gastroenteritis. No travel outside the country. No changes in diet.  Renal: No UTIs, No hematuria Genital: No drainage, bleeding, masses Musculoskeletal: No severe joint pain. Good ROM major joints Skin: No sores or lesions Heme/Lymph: No easy bleeding. No swollen lymph nodes  OBJECTIVE   Vitals:  05/17/21 1003  BP: 138/70  Pulse: 103  Weight: 94.2 kg (207 lb 9.6 oz)  Height: 157.5 cm ( )  Body mass index is 37.97 kg/m.  BP 122/71  Pulse (!) 58    Temp 98.6 F (37 C) (Oral)    Resp 18    Ht  (1.6 m)    Wt 89.8 kg    SpO2 96%    BMI 35.07 kg/m  07/15/2021   PHYSICAL EXAM:  Constitutional: Not cachectic. Hygeine adequate. Vitals signs as above.  Eyes: Pupils reactive, normal extraocular movements. Sclera nonicteric Neuro: CN II-XII intact. No major focal sensory defects. No major motor deficits. Lymph: No head/neck/groin lymphadenopathy Psych: No severe agitation. No severe anxiety. Judgment & insight Adequate, Oriented x4, HENT: Normocephalic, Mucus membranes moist. No thrush.  Neck: Supple, No tracheal deviation. No obvious thyromegaly Chest: No pain to chest wall compression. Good respiratory excursion. No audible wheezing CV: Pulses intact. Regular rhythm. No major extremity edema Ext: No obvious deformity or contracture. Edema: Not present. No cyanosis Skin: No major  subcutaneous nodules. Warm and dry Musculoskeletal: Severe joint rigidity not present. No obvious clubbing. No digital petechiae. Mobility: no assist device moving easily without restrictions  Abdomen:  Obese  Soft. Nondistended. Tenderness at epigastric region. Hernia: Not present. Diastasis recti: Not present. No hepatomegaly. No splenomegaly.  Genital/Pelvic: Inguinal hernia: Not present. Inguinal lymph nodes: without lymphadenopathy nor hidradenitis.   Rectal: (Deferred)    ###################################################################  Labs, Imaging and Diagnostic Testing:  Located in 'Care Everywhere' section of Epic EMR chart  PRIOR [facility] CLINIC NOTES:  Not applicable  SURGERY NOTES:  Not applicable  PATHOLOGY:  Not applicable  Assessment and Plan:  DIAGNOSES:  Diagnoses and all orders for this visit:  Incarcerated hiatal hernia  Organoaxial gastric volvulus  Nausea and vomiting, unspecified vomiting type  DOE (dyspnea on exertion)  BMI 37.0-37.9, adult  Orthopnea  History of esophageal stricture  History of gastric polyp    ASSESSMENT/PLAN  Pleasant obese woman with known hiatal hernia now totally flipped and twisted up in her chest with intermittent episodes of nausea vomiting and chest pain.  I think she would benefit from surgical reduction and repair. Invasive robotic approach. Would plan reduction in hiatal closure. Suspect given the large hiatal hernia with her obesity that she will need mesh reinforcement. Absorbable phasic's mesh. May need relaxation incision with diaphragm repair laterally. We will see.   The anatomy & physiology of the foregut and anti-reflux mechanism was discussed. The pathophysiology of hiatal herniation and GERD was discussed. Natural history risks without surgery was discussed. The patient's symptoms are not adequately controlled by medicines and other non-operative treatments. I feel the risks of no intervention  will lead to serious problems that outweigh the operative risks; therefore, I recommended surgery to reduce the hiatal hernia out of the chest and fundoplication to rebuild the anti-reflux valve and control reflux better. Need for a thorough workup to rule out the differential diagnosis and plan treatment was explained. I explained minimally invasive techniques with possible need for an open approach.  Risks such as bleeding, infection, abscess, leak,injury to other organs, need for repair of tissues / organs, need for further treatment, stroke, heart attack, death, and other risks were discussed. I noted a good likelihood this will help address the problem. Goals of post-operative recovery were discussed as well. Possibility that this will not correct all symptoms was explained. Post-operative dysphagia, need for short-term liquid & pureed diet, inability to vomit, possibility of reherniation, possible need for medicines to help control symptoms in addition to surgery were discussed. We will work to minimize complications. Educational handouts further explaining the pathology, treatment options, and dysphagia  diet was given as well. Questions were answered. The patient expresses understanding & wishes to proceed with surgery.   Given her history of esophageal stricture and gastric polyps, I think it is wise to keep the appointment for EGD endoscopy by Dr. Russella Dar next week to get a lay of the land.  I think it would be helpful to get manometry to get a baseline assessment of her esophageal function. Make sure she does not have any primary dysmotility as that could be explaining things. I am pretty certain that I would only do a partial Toupet fundoplication on her and avoid a complete Nissen fundoplication given her advanced age and large hiatal hernia. We will see.  Had long discussion with the patient and her husband. Questions answered. They are open the idea of doing hiatal hernia repair and agree with  completing the work-up. I gave them my card. I noted we would help arrange some of these things but please call us if they have further questions or concerns about tests.    Ardeth Sportsman, MD, FACS, MASCRS Esophageal, Gastrointestinal & Colorectal Surgery Robotic and Minimally Invasive Surgery  [facility], [facility]   [facility]  [address], [address] [place], [place] [address] [phone] Fax [phone] [address]  CONTACT INFORMATION:  Weekday (9AM-5PM): Call [facility] main office at [phone]  Weeknight (5PM-9AM) or Weekend/Holiday: Check [email or web address] (password " [email or web address]") for General Surgery [facility] coverage  (Please, do not use SecureChat as it is not reliable communication to operating surgeons for immediate patient care)    07/15/2021

## 2021-07-15 NOTE — Anesthesia Procedure Notes (Signed)
Procedure Name: Intubation ?Date/Time: 07/15/2021 7:44 AM ?Performed by: Maxwell Caul, CRNA ?Pre-anesthesia Checklist: Patient identified, Emergency Drugs available, Suction available and Patient being monitored ?Patient Re-evaluated:Patient Re-evaluated prior to induction ?Oxygen Delivery Method: Circle system utilized ?Preoxygenation: Pre-oxygenation with 100% oxygen ?Induction Type: IV induction ?Ventilation: Mask ventilation without difficulty ?Laryngoscope Size: Mac and 4 ?Grade View: Grade I ?Tube type: Oral ?Tube size: 7.0 mm ?Number of attempts: 1 ?Airway Equipment and Method: Stylet ?Placement Confirmation: ETT inserted through vocal cords under direct vision, positive ETCO2 and breath sounds checked- equal and bilateral ?Secured at: 21 cm ?Tube secured with: Tape ?Dental Injury: Teeth and Oropharynx as per pre-operative assessment  ? ? ? ? ?

## 2021-07-15 NOTE — Op Note (Signed)
07/15/2021  11:11 AM  PATIENT:  Deborah Farley  76 y.o. female  Patient Care Team: Abigail MiyamotoPerry, Lawrence Edward, MD as PCP - General (Family Medicine) Revankar, Aundra Dubinajan R, MD as PCP - Cardiology (Cardiology) Earvin HansenBrown, Sarah B, [facility] (Inactive) as Pharmacist (Pharmacist) Karie SodaGross, Nai Borromeo, MD as Consulting Physician (General Surgery) Meryl DareStark, Malcolm T, MD as Consulting Physician (Gastroenterology) Revankar, Aundra Dubinajan R, MD as Consulting Physician (Cardiology) Buck Mamaldwell, Janet P, LCSW as Social Worker (Licensed Clinical Social Worker)  PRE-OPERATIVE DIAGNOSIS:  parasophageal hiatal hernia  POST-OPERATIVE DIAGNOSIS:   INCARCERATED PARAESOPHAGEAL HIATAL HERNIA  PROCEDURE:   1. ROBOTIC reduction of paraesophageal hiatal hernia 2. Type II mediastinal dissection. 3.  Left diaphragm release 4. Primary repair of hiatal hernia over pledgets.  5. Anterior & posterior gastropexy. 6. Toupet (partial 270deg posterior) fundoplication x 4 cm 7. Mesh reinforcement with absorbable mesh  SURGEON:  Ardeth SportsmanSteven C. Tamilyn Lupien, MD  ASSIST:  Axel FillerArmando Ramirez, MD, FACS An experienced assistant was required given the standard of surgical care given the complexity of the case.  This assistant was needed for exposure, dissection, suction, tissue approximation, retraction, perception, etc.  ANESTHESIA:   local and general  EBL:  Total I/O In: 3250 [I.V.:2800; IV Piggyback:450] Out: 145 [Urine:95; Blood:50]  Delay start of Pharmacological VTE agent (>24hrs) due to surgical blood loss or risk of bleeding:  no  ANESTHESIA: 1. General anesthesia. 2. Local anesthetic in a field block around all port sites.  SPECIMEN:  Mediastinal hernia sac (not sent).  DRAINS:  A 19-French Blake drain goes from the right upper quadrant along the lesser curvature of the stomach into the mediastinum.  COUNTS:  YES  PLAN OF CARE: Admit to inpatient   PATIENT DISPOSITION:  PACU - hemodynamically stable.  INDICATION:   Patient with symptomatic  paraesophageal hiatal hernia.  The patient has had extensive work-up & we feel the patient will benefit from repair:  The anatomy & physiology of the foregut and anti-reflux mechanism was discussed.  The pathophysiology of hiatal herniation and GERD was discussed.  Natural history risks without surgery was discussed.   The patient's symptoms are not adequately controlled by medicines and other non-operative treatments.  I feel the risks of no intervention will lead to serious problems that outweigh the operative risks; therefore, I recommended surgery to reduce the hiatal hernia out of the chest and fundoplication to rebuild the anti-reflux valve and control reflux better.  Need for a thorough workup to rule out the differential diagnosis and plan treatment was explained.  I explained laparoscopic techniques with possible need for an open approach.  Risks such as bleeding, infection, abscess, leak, need for further treatment, heart attack, death, and other risks were discussed.   I noted a good likelihood this will help address the problem.  Goals of post-operative recovery were discussed as well.  Possibility that this will not correct all symptoms was explained.  Post-operative dysphagia, need for short-term liquid & pureed diet, inability to vomit, possibility of reherniation, possible need for medicines to help control symptoms in addition to surgery were discussed.  We will work to minimize complications.   Educational handouts further explaining the pathology, treatment options, and dysphagia diet was given as well.  Questions were answered.  The patient expresses understanding & wishes to proceed with surgery.  OR FINDINGS:   Moderate-sized paraesophageal hiatal hernia with mesenteric axial volvulus without obstruction or gangrene involving  100% of the stomach in the mediastinum.  There was a 9 x 8 cm hiatal defect.  Left sagittal diaphragmatic release made 5 cm from the left crus.  It is a  primary repair over pledgets. Mesh reinforcement was used with Mesh was used: Phasix Mesh (a knitted monofilament mesh scaffold using Poly-4-hydroxybutyrate (P4HB), a biologically derived, fully resorbable material)  The patient has a 4 cm Toupet fundoplication -posterior partial fundoplication done given her advanced age and obesity despite manometry showing no major dysmotility.  The patient has had anterior and posterior gastropexy.  DESCRIPTION:   Informed consent was confirmed.  The patient received IV antibiotics prior to incision.  The underwent general anesthesia without difficulty.  A Foley catheter sterilely placed.  The patient was positioned in split leg with arms tucked. The abdomen was prepped and draped in the sterile fashion.  Surgical time-out confirmed our plan.  I placed a 5 mm port in the left subcostal region using Varess entry technique with the patient in steep reverse Trendelenburg and left side up.  Entry was clean.  We induced carbon dioxide insufflation.  Camera inspection revealed no injury.  Under direct visualization, I placed extra ports.  I also placed a 5 mm port in the left subxiphoid region under direct visualization.  I removed that and placed an Omega-shaped rigid Nathanson liver retractor to lift the left lateral sector of the liver anteriorly to expose the esophageal hiatus.  This was secured to the bed using the iron man system.  The Xi robot was carefully docked and instruments placed and advanced under direct visualization.  We focused on dissection.  We grasped the anterior mediastinal sac at the apex of the crus.  I scored through that and got into the anterior mediastinum.  I was able to free the mediastinal sac from its attachments to the pericardium and bilateral pleura using primarily focused gentle blunt dissection as well as focused vessel sealer dissection.  I transected phrenoesophageal attachments to the inner right crus, preserving a two centimeter  cuff of mediastinal sac until I found the base of the crura.  I then came around anteriorly on the left side and freed up the phrenoesophageal attachments of the mediastinal sac on the medial part of the left crus on the superior half.  I did careful mediastinal dissection to free the mediastinal sac.  With that, we could relieve the suction cup affect of the hernia sac and help reduce the stomach back down into the abdomen, flipped back approriately.  I had a mesenteric-axial type of chronic gastric volvulus without any gangrene  We ligated the short gastrics along the lesser curvature of the stomach about a third the way down and then came up proximally over the fundus.  We released the attachments of the stomach to the retroperitoneum until we were able to connect with the prior dissection on the left crus.  We completed the release of phrenoesophageal attachments to the medial part of the left crus down to its base.  With this, we had circumferential mobilization.    We placed the stomach and esophagus on axial tension.  I then did a Type II mediastinal dissection where I freed the esophagus from its attachments to the aorta, spine, pleura, and pericardium using primarily gentle blunt as well as focused ultrasonic dissection.  We saw the anterior & posterior vagus nerves intact.  They were somewhat wispy but there was a dominant 1 on the left anterior esophagus and classic anatomy that we preserved it at all times.  I procedded to dissect about 20 cm proximally into the mediastinum.  With that I could straighten out the esophagus and get 5 cm of intra-abdominal length of the esophagus at a best estimation.  I freed the anterior mediastinal sac off the esophagus & stomach.  We saw the anterior vagus nerve and freed the sac off of the vagus.  I dissected out & removed the fatty  epiphrenic pads at the esophagogastric junction. With that, I could better define the esophagogastric junction.  I confirmed the  the patient had 5 cm of intra-abdominal esophageal length off tension.  Patient did have a very large hiatal hernia defect that did not seem to want to come together without some tension.  I therefore did a release on the left diaphragm and a anterior to posterior sagittal type plane trying to preserve the muscle layer from the base of the crus to anterior to the apex of the crura in a parallel fashion.  That provided an extra 2-3 cm of release like a component separation and allow the crura to come together better.  We lower the abdominal insufflation pressure to 10 which helped as well.  I brought the fundus of the stomach posterior to the esophagus over to the right side.  The wrap was mobile with the classic shoe shine maneuver.  Wrap became together gently.  We reflected the stomach left laterally and closed the esophageal hiatus using #1 Ethibond stitch using horizontal mattress stitches with pledgets on both sides.  I did that x3 stitches.  The crura had good substance and they came together well without any tension.  Because of the larger defect, I reinforced the repair using a 15 x 10 cm biologic Phasix mesh.  I cut out a 2x4 cm part of mesh in the middle third of the mesh such that the mesh had a broad U shape transversely, one tail 5 cm wide & the other 8cm.  We brought the mesh in and laid it over the crural repair, tails anterior over the crura.  I tucked the broader "U" tail of the mesh between the left diaphragm and the spleen, the narrower "U" tail over the right crus.  I secured the medial anterior corners of the U along the crura on each side to help tack it down and avoid any direct exposure to the esophagus.  Use that using running 2-0  V lock suture.  Use a longer 1 for the left side to help patch around the diaphragmatic releases well as well as laterally so that it laid well and flat.  Provided good overlap.  I brought the fundus of the stomach behind the esophagus and cardia to set up a  fundoplication wrap.  I did a posterior gastropexy by taking of 0 Ethibond stitches to the posterior part of the right side of the wrap and thru the Phasix mesh and crural closure.  Did that times 3 sutures.  I placed a similar stitch on the left anterior side as well.  That way the stomach covered the mesh and protected it from any esophageal exposure.  With the anterior and posterior gastropexy's, stomach laid well for a fundoplication wrap.   Her preop manometry looked good but she did have some swelling and edema in her esophogastric region as well as poor appearing esophagus tissue.  I decided not to be too aggressive given her advanced age and do a partial fundoplication.  Since I had good intra-abdominal esophageal length, I then did a classic 4 cm Toupet fundoplication on the true esophagus above the cardia  using Ethibond stitch in the left side of the wrap, then anterior esophagus and tied down.  Then did a similar mirror-image suture in the right side of the wrap and right lateral esophagus as well.  Did 2 extra pairs of sutures to have a 4 cm partial posterior wrap.    The wrap was soft and floppy.  I placed a drain as noted above.  I did irrigation and ensured hemostasis.  I saw no evidence of any leak or perforation or other abnormality.  I removed the [facility] liver retractor under direct visualization.  I evacuated carbon dioxide and removed the ports.  The skin was closed with Monocryl and sterile dressings applied.  The patient is being extubated and brought back to the recovery room.  I discussed postop care in detail with the patient and family in in the office.  Discussed again with the patient in the holding area. I made an attempt to locate & reach the desired patient contact to discuss the patient's overall status and my recommendations.  No one is available at this time.  My plans & instructions have been written in the chart.   Ardeth Sportsman, M.D., F.A.C.S. Gastrointestinal and  Minimally Invasive Surgery [facility] [address], [address] [place], [place] [address] [phone] Main / Paging

## 2021-07-15 NOTE — Transfer of Care (Signed)
Immediate Anesthesia Transfer of Care Note ? ?Patient: Deborah Farley ? ?Procedure(s) Performed: robotic paraesophageal hiatal hernia with fundoplication, diaphragm release and mesh repair and bilateral TAP block (Abdomen) ? ?Patient Location: PACU ? ?Anesthesia Type:General ? ?Level of Consciousness: awake, alert  and oriented ? ?Airway & Oxygen Therapy: Patient Spontanous Breathing and Patient connected to face mask oxygen ? ?Post-op Assessment: Report given to RN and Post -op Vital signs reviewed and stable ? ?Post vital signs: Reviewed and stable ? ?Last Vitals:  ?Vitals Value Taken Time  ?BP    ?Temp    ?Pulse    ?Resp 12 07/15/21 1102  ?SpO2    ?Vitals shown include unvalidated device data. ? ?Last Pain:  ?Vitals:  ? 07/15/21 0605  ?TempSrc: Oral  ?   ? ?  ? ?Complications: No notable events documented. ?

## 2021-07-16 ENCOUNTER — Inpatient Hospital Stay (HOSPITAL_COMMUNITY): Payer: Medicare Other

## 2021-07-16 LAB — BASIC METABOLIC PANEL
Anion gap: 7 (ref 5–15)
BUN: 17 mg/dL (ref 8–23)
CO2: 23 mmol/L (ref 22–32)
Calcium: 9.1 mg/dL (ref 8.9–10.3)
Chloride: 107 mmol/L (ref 98–111)
Creatinine, Ser: 0.8 mg/dL (ref 0.44–1.00)
GFR, Estimated: >60 mL/min (ref >60)
Glucose, Bld: 138 mg/dL — ABNORMAL HIGH (ref 70–99)
Potassium: 4.5 mmol/L (ref 3.5–5.1)
Sodium: 137 mmol/L (ref 135–145)

## 2021-07-16 LAB — CBC
HCT: 40 % (ref 36.0–46.0)
Hemoglobin: 13.4 g/dL (ref 12.0–15.0)
MCH: 33.4 pg (ref 26.0–34.0)
MCHC: 33.5 g/dL (ref 30.0–36.0)
MCV: 99.8 fL (ref 80.0–100.0)
Platelets: 157 10*3/uL (ref 150–400)
RBC: 4.01 MIL/uL (ref 3.87–5.11)
RDW: 15 % (ref 11.5–15.5)
WBC: 13.2 10*3/uL — ABNORMAL HIGH (ref 4.0–10.5)
nRBC: 0 % (ref 0.0–0.2)

## 2021-07-16 LAB — MAGNESIUM: Magnesium: 1.9 mg/dL (ref 1.7–2.4)

## 2021-07-16 MED ORDER — IOHEXOL 300 MG/ML  SOLN
60.0000 mL | Freq: Once | INTRAMUSCULAR | Status: AC | PRN
  Administered 2021-07-16: 60 mL via ORAL

## 2021-07-16 NOTE — Progress Notes (Signed)
1 Day Post-Op Robotic Hiatal Hernia Repair ?Subjective: ?Having a lot of pain with movement, denies nausea, tolerating clears, concerned about walking with a walker due to upper abd pain ? ?Objective: ?Vital signs in last 24 hours: ?Temp:  [97.5 ?F (36.4 ?C)-98.3 ?F (36.8 ?C)] 98.2 ?F (36.8 ?C) (03/11 0533) ?Pulse Rate:  [50-80] 80 (03/11 0533) ?Resp:  [12-17] 16 (03/11 0533) ?BP: (100-153)/(60-85) 144/85 (03/11 0533) ?SpO2:  [91 %-96 %] 93 % (03/11 0533) ? ? ?Intake/Output from previous day: ?03/10 0701 - 03/11 0700 ?In: [id number] [P.O.:180; I.V.:2800; IV Piggyback:450] ?Out: 980 [Urine:845; Drains:85; Blood:50] ?Intake/Output this shift: ?No intake/output data recorded. ? ? ?General appearance: alert and cooperative ?GI: soft, non-distended, obese ?JP: SS fluid ? ?Incision: no significant drainage ? ?Lab Results:  ?Recent Labs  ?  07/16/21 ?[id number]  ?WBC 13.2*  ?HGB 13.4  ?HCT 40.0  ?PLT 157  ? ?BMET ?Recent Labs  ?  07/16/21 ?[id number]  ?NA 137  ?K 4.5  ?CL 107  ?CO2 23  ?GLUCOSE 138*  ?BUN 17  ?CREATININE 0.80  ?CALCIUM 9.1  ? ?PT/INR ?No results for input(s): LABPROT, INR in the last 72 hours. ?ABG ?No results for input(s): PHART, HCO3 in the last 72 hours. ? ?Invalid input(s): PCO2, PO2 ? ?MEDS, Scheduled ? acetaminophen  1,000 mg Oral Q6H  ? ARIPiprazole  2 mg Oral Daily  ? aspirin EC  81 mg Oral Daily  ? buPROPion  300 mg Oral QHS  ? cholecalciferol  2,000 Units Oral Daily  ? diltiazem  120 mg Oral Daily  ? enoxaparin (LOVENOX) injection  40 mg Subcutaneous [id number]  ? folic acid  1 mg Oral Daily  ? gabapentin  300 mg Oral Once  ? gabapentin  300 mg Oral BID  ? isosorbide mononitrate  30 mg Oral Daily  ? latanoprost  1 drop Both Eyes QHS  ? levothyroxine  25 mcg Oral QAC breakfast  ? lip balm  1 application. Topical BID  ? [START ON 07/17/2021] methotrexate  25 mg Oral Q Sun  ? metoprolol tartrate  25 mg Oral BID  ? multivitamin with minerals  1 tablet Oral Daily  ? pantoprazole  80 mg Oral Daily  ? polyethylene glycol  17 g  Oral BID  ? pravastatin  20 mg Oral Daily  ? sertraline  50 mg Oral Daily  ? sodium chloride flush  3 mL Intravenous Q12H  ? traZODone  50 mg Oral QHS  ? [START ON 07/17/2021] Vitamin D (Ergocalciferol)  50,000 Units Oral Q Sun  ? ? ?Studies/Results: ?No results found. ? ?Assessment: ?s/p Procedure(s): ?robotic paraesophageal hiatal hernia with fundoplication, diaphragm release and mesh repair and bilateral TAP block ?Patient Active Problem List  ? Diagnosis Date Noted  ? Incarcerated hiatal hernia 07/15/2021  ? Degeneration of lumbar intervertebral disc 03/02/2021  ? Depression 02/17/2021  ? Chronic pain syndrome 11/30/2020  ? PTSD (post-traumatic stress disorder) 11/30/2020  ? Depression, major, single episode, moderate ([place]) 08/31/2020  ? Obesity, diabetes, and hypertension syndrome ([place]) 07/29/2020  ? Hernia, hiatal   ? High cholesterol   ? Memory difficulty   ? Spastic esophagus   ? Acute sinusitis 03/19/2020  ? Suspected pulmonary embolism 03/11/2019  ? Dyspnea on exertion 01/16/2019  ? Gait disturbance 03/15/2017  ? 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 History of diabetes mellitus 05/31/2016  ? History of hypothyroidism 05/31/2016  ? History of gastroesophageal reflux (GERD) 05/31/2016  ? Rheumatoid arthritis ([place]) 04/12/2016  ? High risk medication use 04/12/2016  ?  Heart disease 03/01/2016  ? Hyperlipidemia 02/29/2016  ? Hypertension 02/29/2016  ? Hypothyroidism 02/29/2016  ? Obesity (BMI 35.0-39.9 without comorbidity) 02/29/2016  ? Acid reflux 02/29/2016  ? Progressive angina ([place]) 08/11/2015  ? Diet-controlled diabetes mellitus ([place]) 08/11/2015  ? Chronic idiopathic constipation 07/02/2015  ? Chronic systolic congestive heart failure ([place]) 07/02/2015  ? Hiatal hernia 07/02/2015  ? Malaise and fatigue 07/02/2015  ? Major depressive disorder, recurrent episode, moderate ([place]) 07/02/2015  ? Coronary artery disease involving native coronary artery of native heart with unstable angina pectoris ([place])  07/02/2015  ? Epigastric pain 04/08/2015  ? ? ?Expected post op course ? ?Plan: ?UGI today  ?Advance diet if UGI looks ok ?PT to assist with ambulation ?Multimodality pain control ? ? LOS: 1 day  ? ? ? ?Marland KitchenRosario Adie, MD ?[facility], [place] ? ? ? ?07/16/2021 ?8:08 AM ? ? ? ? ? ?

## 2021-07-16 NOTE — Evaluation (Signed)
Physical Therapy Evaluation ?Patient Details ?Name: Deborah Farley ?MRN: [id number] ?DOB: [birth year] ?Today's Date: 07/16/2021 ? ?History of Present Illness ? Patient is 76 y.o. female s/p hiatal hernia repair on 07/15/21 with PMH significant for anxiety, DM, RA, GERD, glaucoma, HTN, HLD, thyroid disease, CAD, obesity, PTSD. ? ?  ?Clinical Impression ? Deborah Farley is 76 y.o. female admitted with above HPI and diagnosis. Patient is currently limited by functional impairments below (see PT problem list). Patient lives with her spouse and is modified independent with a RW at baseline. Currently she requires min guard for safety with transfers and gait with RW, pt is requiring 3L/min to maintain SpO2 at 93% or greater with gait. Patient will benefit from continued skilled PT interventions to address impairments and progress independence with mobility, recommending OPPT follow up as pt is likely to progress well with mobility and will benefit from strength/balance training. Acute PT will follow and progress as able.    ?   ? ?Recommendations for follow up therapy are one component of a multi-disciplinary discharge planning process, led by the attending physician.  Recommendations may be updated based on patient status, additional functional criteria and insurance authorization. ? ?Follow Up Recommendations Outpatient PT (once pt returns home would benefit from OPPT for balance and strengthening) ? ?  ?Assistance Recommended at Discharge Intermittent Supervision/Assistance  ?Patient can return home with the following ? A little help with walking and/or transfers;A little help with bathing/dressing/bathroom;Assistance with cooking/housework;Assist for transportation;Help with stairs or ramp for entrance ? ?  ?Equipment Recommendations None recommended by PT  ?Recommendations for Other Services ?    ?  ?Functional Status Assessment Patient has had a recent decline in their functional status and demonstrates the ability to  make significant improvements in function in a reasonable and predictable amount of time.  ? ?  ?Precautions / Restrictions Precautions ?Precautions: Fall ?Precaution Comments: Rt UQ JP drain ?Restrictions ?Weight Bearing Restrictions: No  ? ?  ? ?Mobility ? Bed Mobility ?  ?  ?  ?  ?  ?  ?  ?General bed mobility comments: pt OOB in recliner ?  ? ?Transfers ?Overall transfer level: Needs assistance ?Equipment used: Rolling walker (2 wheels) ?Transfers: Sit to/from Stand ?Sit to Stand: Supervision ?  ?  ?  ?  ?  ?General transfer comment: supervision for safety, pt using bil UE for power up ?  ? ?Ambulation/Gait ?Ambulation/Gait assistance: Min guard, Min assist ?Gait Distance (Feet): 160 Feet ?Assistive device: Rolling walker (2 wheels) ?Gait Pattern/deviations: Decreased stride length, Step-through pattern ?Gait velocity: decr ?  ?  ?General Gait Details: pt slightly unsteady but no LOB noted, overall slow and cautious gait pattern. VSS with pt on 3L/min and SpO2 at 93%. cues for safe position to RW at start and pt maintained throughout. ? ?Stairs ?  ?  ?  ?  ?  ? ?Wheelchair Mobility ?  ? ?Modified Rankin (Stroke Patients Only) ?  ? ?  ? ?Balance Overall balance assessment: Needs assistance ?Sitting-balance support: Feet supported ?Sitting balance-Leahy Scale: Good ?  ?  ?Standing balance support: During functional activity, Reliant on assistive device for balance, Bilateral upper extremity supported ?Standing balance-Leahy Scale: Fair ?  ?  ?  ?  ?  ?  ?  ?  ?  ?  ?  ?  ?   ? ? ? ?Pertinent Vitals/Pain Pain Assessment ?Pain Assessment: Faces ?Faces Pain Scale: Hurts a little bit ?Pain Location: abdomen ?Pain Descriptors / Indicators: Discomfort ?  Pain Intervention(s): Limited activity within patient's tolerance, Monitored during session, Repositioned  ? ? ?Home Living Family/patient expects to be discharged to:: Private residence ?Living Arrangements: Spouse/significant other ?Available Help at Discharge:  Family ?Type of Home: House ?Home Access: Stairs to enter ?Entrance Stairs-Rails: Can reach both ?Entrance Stairs-Number of Steps: 5 ?  ?Home Layout: Two level;Able to live on main level with bedroom/bathroom;1/2 bath on main level;Bed/bath upstairs (can sleep in recliner if needed) ?Home Equipment: Conservation officer, nature (2 wheels) ?Additional Comments: will stay in hotel with son initially and then return home  ?  ?Prior Function Prior Level of Function : Independent/Modified Independent;Driving ?  ?  ?  ?  ?  ?  ?Mobility Comments: does not use AD for mobiloity ?  ?  ? ? ?Hand Dominance  ? Dominant Hand: Right ? ?  ?Extremity/Trunk Assessment  ? Upper Extremity Assessment ?Upper Extremity Assessment: Overall WFL for tasks assessed ?  ? ?Lower Extremity Assessment ?Lower Extremity Assessment: Overall WFL for tasks assessed ?  ? ?Cervical / Trunk Assessment ?Cervical / Trunk Assessment: Normal  ?Communication  ? Communication: No difficulties  ?Cognition Arousal/Alertness: Awake/alert ?Behavior During Therapy: [facility] for tasks assessed/performed ?Overall Cognitive Status: Within Functional Limits for tasks assessed ?  ?  ?  ?  ?  ?  ?  ?  ?  ?  ?  ?  ?  ?  ?  ?  ?  ?  ?  ? ?  ?General Comments   ? ?  ?Exercises    ? ?Assessment/Plan  ?  ?PT Assessment Patient needs continued PT services  ?PT Problem List Decreased strength;Decreased activity tolerance;Decreased balance;Decreased mobility;Decreased knowledge of use of DME;Decreased knowledge of precautions ? ?   ?  ?PT Treatment Interventions DME instruction;Gait training;Stair training;Functional mobility training;Therapeutic activities;Therapeutic exercise;Balance training;Patient/family education   ? ?PT Goals (Current goals can be found in the Care Plan section)  ?Acute Rehab PT Goals ?Patient Stated Goal: get back home and regain strength ?PT Goal Formulation: With patient ?Time For Goal Achievement: 07/30/21 ?Potential to Achieve Goals: Good ? ?  ?Frequency Min 3X/week ?   ? ? ?Co-evaluation   ?  ?  ?  ?  ? ? ?  ?AM-PAC PT "6 Clicks" Mobility  ?Outcome Measure Help needed turning from your back to your side while in a flat bed without using bedrails?: A Little ?Help needed moving from lying on your back to sitting on the side of a flat bed without using bedrails?: A Little ?Help needed moving to and from a bed to a chair (including a wheelchair)?: A Little ?Help needed standing up from a chair using your arms (e.g., wheelchair or bedside chair)?: A Little ?Help needed to walk in hospital room?: A Little ?Help needed climbing 3-5 steps with a railing? : A Little ?6 Click Score: 18 ? ?  ?End of Session Equipment Utilized During Treatment: Gait belt ?Activity Tolerance: Patient tolerated treatment well ?Patient left: in chair;with call bell/phone within reach;with family/visitor present ?Nurse Communication: Mobility status ?PT Visit Diagnosis: Muscle weakness (generalized) (M62.81);Difficulty in walking, not elsewhere classified (R26.2);Unsteadiness on feet (R26.81) ?  ? ?Time: [id number] ?PT Time Calculation (min) (ACUTE ONLY): 21 min ? ? ?Charges:   PT Evaluation ?$PT Eval Low Complexity: 1 Low ?  ?  ?   ? ? ? Gwynneth Albright PT, DPT ?Acute Rehabilitation Services ?Office [phone] ?Pager [phone]  ? ? ?Jacques Navy ?07/16/2021, 3:36 PM ? ?

## 2021-07-17 NOTE — Progress Notes (Signed)
2 Days Post-Op Robotic Hiatal Hernia Repair ?Subjective: ?Pain improved, tolerating clears, requiring some O2 with ambulation ?Objective: ?Vital signs in last 24 hours: ?Temp:  [97.4 ?F (36.3 ?C)-98.4 ?F (36.9 ?C)] 97.4 ?F (36.3 ?C) (03/12 0421) ?Pulse Rate:  [54-70] 63 (03/12 0421) ?Resp:  [14-16] 16 (03/12 0421) ?BP: (121-143)/(61-75) 143/75 (03/12 0421) ?SpO2:  [90 %-96 %] 96 % (03/12 0421) ? ? ?Intake/Output from previous day: ?03/11 0701 - 03/12 0700 ?In: 720 [P.O.:720] ?Out: 3075 [Urine:2950; Drains:125] ?Intake/Output this shift: ?No intake/output data recorded. ? ? ?General appearance: alert and cooperative ?GI: soft, non-distended, obese ?JP: SS fluid ? ?Incision: no significant drainage ? ?Lab Results:  ?Recent Labs  ?  07/16/21 ?0156  ?WBC 13.2*  ?HGB 13.4  ?HCT 40.0  ?PLT 157  ? ? ?BMET ?Recent Labs  ?  07/16/21 ?1537  ?NA 137  ?K 4.5  ?CL 107  ?CO2 23  ?GLUCOSE 138*  ?BUN 17  ?CREATININE 0.80  ?CALCIUM 9.1  ? ? ?PT/INR ?No results for input(s): LABPROT, INR in the last 72 hours. ?ABG ?No results for input(s): PHART, HCO3 in the last 72 hours. ? ?Invalid input(s): PCO2, PO2 ? ?MEDS, Scheduled ? acetaminophen  1,000 mg Oral Q6H  ? ARIPiprazole  2 mg Oral Daily  ? aspirin EC  81 mg Oral Daily  ? buPROPion  300 mg Oral QHS  ? cholecalciferol  2,000 Units Oral Daily  ? diltiazem  120 mg Oral Daily  ? enoxaparin (LOVENOX) injection  40 mg Subcutaneous Q24H  ? folic acid  1 mg Oral Daily  ? gabapentin  300 mg Oral Once  ? gabapentin  300 mg Oral BID  ? isosorbide mononitrate  30 mg Oral Daily  ? latanoprost  1 drop Both Eyes QHS  ? levothyroxine  25 mcg Oral QAC breakfast  ? lip balm  1 application. Topical BID  ? methotrexate  25 mg Oral Q Sun  ? metoprolol tartrate  25 mg Oral BID  ? multivitamin with minerals  1 tablet Oral Daily  ? pantoprazole  80 mg Oral Daily  ? polyethylene glycol  17 g Oral BID  ? pravastatin  20 mg Oral Daily  ? sertraline  50 mg Oral Daily  ? sodium chloride flush  3 mL Intravenous  Q12H  ? traZODone  50 mg Oral QHS  ? Vitamin D (Ergocalciferol)  50,000 Units Oral Q Sun  ? ? ?Studies/Results: ?No results found. ? ?Assessment: ?s/p Procedure(s): ?robotic paraesophageal hiatal hernia with fundoplication, diaphragm release and mesh repair and bilateral TAP block ?Patient Active Problem List  ? Diagnosis Date Noted  ? Incarcerated hiatal hernia 07/15/2021  ? Degeneration of lumbar intervertebral disc 03/02/2021  ? Depression 02/17/2021  ? Chronic pain syndrome 11/30/2020  ? PTSD (post-traumatic stress disorder) 11/30/2020  ? Depression, major, single episode, moderate (HCC) 08/31/2020  ? Obesity, diabetes, and hypertension syndrome (HCC) 07/29/2020  ? Hernia, hiatal   ? High cholesterol   ? Memory difficulty   ? Spastic esophagus   ? Acute sinusitis 03/19/2020  ? Suspected pulmonary embolism 03/11/2019  ? Dyspnea on exertion 01/16/2019  ? Gait disturbance 03/15/2017  ? 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 History of diabetes mellitus 05/31/2016  ? History of hypothyroidism 05/31/2016  ? History of gastroesophageal reflux (GERD) 05/31/2016  ? Rheumatoid arthritis (HCC) 04/12/2016  ? High risk medication use 04/12/2016  ? Heart disease 03/01/2016  ? Hyperlipidemia 02/29/2016  ? Hypertension 02/29/2016  ? Hypothyroidism 02/29/2016  ? Obesity (BMI 35.0-39.9  without comorbidity) 02/29/2016  ? Acid reflux 02/29/2016  ? Progressive angina (HCC) 08/11/2015  ? Diet-controlled diabetes mellitus (HCC) 08/11/2015  ? Chronic idiopathic constipation 07/02/2015  ? Chronic systolic congestive heart failure (HCC) 07/02/2015  ? Hiatal hernia 07/02/2015  ? Malaise and fatigue 07/02/2015  ? Major depressive disorder, recurrent episode, moderate (HCC) 07/02/2015  ? Coronary artery disease involving native coronary artery of native heart with unstable angina pectoris (HCC) 07/02/2015  ? Epigastric pain 04/08/2015  ? ? ?Expected post op course ? ?Plan: ?Advance to pureed diet  ?Multimodality pain control ? ? LOS: 2  days  ? ? ? ?Marland KitchenVanita Panda, MD ?[facility], [place] ? ? ? ?07/17/2021 ?8:01 AM ? ? ? ? ? ?

## 2021-07-17 NOTE — TOC Progression Note (Signed)
Transition of Care (TOC) - Progression Note  ? ? ?Patient Details  ?Name: Deborah Farley ?MRN: [id number] ?Date of Birth: 1945-07-19 ? ?Transition of Care (TOC) CM/SW Contact  ?Tawanna Cooler, RN ?Phone Number: ?07/17/2021, 10:39 AM ? ?Clinical Narrative:    ? ?PT recommending outpatient PT for patient.  Spoke with patient at bedside. She is agreeable.  Lives outside of [place] area.  Will need a script at discharge for PT.  Patient verbalized understanding.  ? ?  ?  ? ?Readmission Risk Interventions ?No flowsheet data found. ? ?

## 2021-07-18 ENCOUNTER — Encounter (HOSPITAL_COMMUNITY): Payer: Self-pay | Admitting: Surgery

## 2021-07-18 ENCOUNTER — Inpatient Hospital Stay (HOSPITAL_COMMUNITY): Payer: Medicare Other

## 2021-07-18 DIAGNOSIS — D696 Thrombocytopenia, unspecified: Secondary | ICD-10-CM | POA: Diagnosis present

## 2021-07-18 DIAGNOSIS — R7401 Elevation of levels of liver transaminase levels: Secondary | ICD-10-CM

## 2021-07-18 DIAGNOSIS — K440 Diaphragmatic hernia with obstruction, without gangrene: Secondary | ICD-10-CM

## 2021-07-18 DIAGNOSIS — G9341 Metabolic encephalopathy: Secondary | ICD-10-CM | POA: Diagnosis present

## 2021-07-18 DIAGNOSIS — E441 Mild protein-calorie malnutrition: Secondary | ICD-10-CM

## 2021-07-18 DIAGNOSIS — R0902 Hypoxemia: Secondary | ICD-10-CM

## 2021-07-18 HISTORY — DX: Mild protein-calorie malnutrition: E44.1

## 2021-07-18 HISTORY — DX: Hypoxemia: R09.02

## 2021-07-18 HISTORY — DX: Thrombocytopenia, unspecified: D69.6

## 2021-07-18 HISTORY — DX: Elevation of levels of liver transaminase levels: R74.01

## 2021-07-18 LAB — CBC
HCT: 35.1 % — ABNORMAL LOW (ref 36.0–46.0)
Hemoglobin: 11.7 g/dL — ABNORMAL LOW (ref 12.0–15.0)
MCH: 33.4 pg (ref 26.0–34.0)
MCHC: 33.3 g/dL (ref 30.0–36.0)
MCV: 100.3 fL — ABNORMAL HIGH (ref 80.0–100.0)
Platelets: 141 10*3/uL — ABNORMAL LOW (ref 150–400)
RBC: 3.5 MIL/uL — ABNORMAL LOW (ref 3.87–5.11)
RDW: 15.5 % (ref 11.5–15.5)
WBC: 13.2 10*3/uL — ABNORMAL HIGH (ref 4.0–10.5)
nRBC: 0 % (ref 0.0–0.2)

## 2021-07-18 LAB — COMPREHENSIVE METABOLIC PANEL
ALT: 101 U/L — ABNORMAL HIGH (ref 0–44)
AST: 103 U/L — ABNORMAL HIGH (ref 15–41)
Albumin: 3 g/dL — ABNORMAL LOW (ref 3.5–5.0)
Alkaline Phosphatase: 71 U/L (ref 38–126)
Anion gap: 7 (ref 5–15)
BUN: 17 mg/dL (ref 8–23)
CO2: 26 mmol/L (ref 22–32)
Calcium: 8.3 mg/dL — ABNORMAL LOW (ref 8.9–10.3)
Chloride: 108 mmol/L (ref 98–111)
Creatinine, Ser: 0.8 mg/dL (ref 0.44–1.00)
GFR, Estimated: >60 mL/min (ref >60)
Glucose, Bld: 104 mg/dL — ABNORMAL HIGH (ref 70–99)
Potassium: 3.4 mmol/L — ABNORMAL LOW (ref 3.5–5.1)
Sodium: 141 mmol/L (ref 135–145)
Total Bilirubin: 0.5 mg/dL (ref 0.3–1.2)
Total Protein: 5.6 g/dL — ABNORMAL LOW (ref 6.5–8.1)

## 2021-07-18 LAB — VITAMIN B12: Vitamin B-12: 386 pg/mL (ref 180–914)

## 2021-07-18 LAB — TSH: TSH: 2.24 u[IU]/mL (ref 0.350–4.500)

## 2021-07-18 LAB — GLUCOSE, CAPILLARY: Glucose-Capillary: 165 mg/dL — ABNORMAL HIGH (ref 70–99)

## 2021-07-18 LAB — AMMONIA: Ammonia: 17 umol/L (ref 9–35)

## 2021-07-18 MED ORDER — MAGIC MOUTHWASH
15.0000 mL | Freq: Four times a day (QID) | ORAL | Status: DC
  Administered 2021-07-18 (×2): 15 mL via ORAL
  Filled 2021-07-18 (×5): qty 15

## 2021-07-18 MED ORDER — INSULIN ASPART 100 UNIT/ML IJ SOLN
0.0000 [IU] | Freq: Three times a day (TID) | INTRAMUSCULAR | Status: DC
  Administered 2021-07-19: 1 [IU] via SUBCUTANEOUS

## 2021-07-18 MED ORDER — FUROSEMIDE 10 MG/ML IJ SOLN
40.0000 mg | Freq: Once | INTRAMUSCULAR | Status: AC
Start: 2021-07-18 — End: 2021-07-18
  Administered 2021-07-18: 40 mg via INTRAVENOUS
  Filled 2021-07-18: qty 4

## 2021-07-18 MED ORDER — ALBUMIN HUMAN 5 % IV SOLN
12.5000 g | Freq: Four times a day (QID) | INTRAVENOUS | Status: DC | PRN
  Filled 2021-07-18: qty 250

## 2021-07-18 MED ORDER — DEXTROSE 5 % IV SOLN
1000.0000 mg | Freq: Four times a day (QID) | INTRAVENOUS | Status: DC | PRN
  Filled 2021-07-18: qty 10

## 2021-07-18 MED ORDER — FENTANYL CITRATE PF 50 MCG/ML IJ SOSY: 12.5000 ug | PREFILLED_SYRINGE | INTRAMUSCULAR | Status: DC | PRN

## 2021-07-18 MED ORDER — ARIPIPRAZOLE 2 MG PO TABS
2.0000 mg | ORAL_TABLET | Freq: Every day | ORAL | Status: DC
  Administered 2021-07-19: 2 mg via ORAL
  Filled 2021-07-18: qty 1

## 2021-07-18 NOTE — Evaluation (Signed)
Clinical/Bedside Swallow Evaluation ?Patient Details  ?Name: Deborah Farley ?MRN: [id number] ?Date of Birth: 14-Nov-1945 ? ?Today's Date: 07/18/2021 ?Time: SLP Start Time (ACUTE ONLY): 1320 SLP Stop Time (ACUTE ONLY): [id number] ?SLP Time Calculation (min) (ACUTE ONLY): 15 min ? ?Past Medical History:  ?Past Medical History:  ?Diagnosis Date  ? Acid reflux 02/29/2016  ? Acute sinusitis 03/19/2020  ? Allergy   ? Chronic idiopathic constipation 07/02/2015  ? Chronic pain syndrome 11/30/2020  ? Coronary artery disease involving native coronary artery of native heart with unstable angina pectoris ([place]) 07/02/2015  ? Formatting of this note might be different from the original. [place]  ? 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 Depression   ? Depression, major, single episode, moderate ([place]) 08/31/2020  ? Dyspnea on exertion 01/16/2019  ? Epigastric pain 04/08/2015  ? Gait disturbance 03/15/2017  ? Heart disease   ? Hernia, hiatal   ? Hiatal hernia 07/02/2015  ? High cholesterol   ? High risk medication use 04/12/2016  ? Methotrexate PLQ Eye Exam: 07/28/16 WNL @ [facility] Follow up in 6 months.   ? History of diabetes mellitus 05/31/2016  ? History of gastroesophageal reflux (GERD) 05/31/2016  ? History of hypothyroidism 05/31/2016  ? Hyperlipidemia 02/29/2016  ? Hypertension   ? Hypothyroidism   ? Major depressive disorder, recurrent episode, moderate ([place]) 07/02/2015  ? Malaise and fatigue 07/02/2015  ? Memory difficulty   ? Obesity (BMI 35.0-39.9 without comorbidity) 02/29/2016  ? Pre-diabetes   ? Progressive angina ([place]) 08/11/2015  ? PTSD (post-traumatic stress disorder) 11/30/2020  ? Rheumatoid arthritis ([place]) 04/12/2016  ? Spastic esophagus   ? Suspected pulmonary embolism 03/11/2019  ? ?Past Surgical History:  ?Past Surgical History:  ?Procedure Laterality Date  ? ABDOMINAL HYSTERECTOMY    ? BACK SURGERY    ? CARDIAC CATHETERIZATION N/A 08/12/2015  ? Procedure: Left Heart Cath and Coronary Angiography;  Surgeon: Adrian Prows, MD;  Location: [facility];  Service: Cardiovascular;  Laterality: N/A;  ? ESOPHAGEAL MANOMETRY N/A 07/01/2021  ? Procedure: ESOPHAGEAL MANOMETRY (EM);  Surgeon: Ladene Artist, MD;  Location: [facility];  Service: Endoscopy;  Laterality: N/A;  ? LEFT HEART CATH AND CORONARY ANGIOGRAPHY N/A 02/20/2018  ? Procedure: LEFT HEART CATH AND CORONARY ANGIOGRAPHY;  Surgeon: Troy Sine, MD;  Location: [place] [facility];  Service: Cardiovascular;  Laterality: N/A;  ? LEFT HEART CATH AND CORONARY ANGIOGRAPHY N/A 01/19/2020  ? Procedure: LEFT HEART CATH AND CORONARY ANGIOGRAPHY;  Surgeon: Leonie Man, MD;  Location: [facility];  Service: Cardiovascular;  Laterality: N/A;  ? TOTAL ABDOMINAL HYSTERECTOMY W/ BILATERAL SALPINGOOPHORECTOMY    ? XI ROBOTIC ASSISTED HIATAL HERNIA REPAIR N/A 07/15/2021  ? Procedure: robotic paraesophageal hiatal hernia with fundoplication, diaphragm release and mesh repair and bilateral TAP block;  Surgeon: Michael Boston, MD;  Location: [facility];  Service: General;  Laterality: N/A;  ? ?HPI:  ?Patient is 76 y.o. 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 She was started on Dys 1 (puree) solids, thin liquids by surgery but SLP ordered to assess for potential oropharyngeal phase of dysphagia. PMH significant for anxiety, DM, RA, GERD, glaucoma, HTN, HLD, thyroid disease, CAD, obesity, PTSD.  ?  ?Assessment / Plan / Recommendation  ?Clinical Impression ? Patient presented with suspected cognitive-based dysphagia as per this limited bedside/clinical swallow evaluation. At  time of evaluation, patient exhibiting confusion and questionable hallucinations which daughter reports are not baseline. Patient held cup to take sips of thin liquids (soda) with SLP assisting with managing  cup as well as assisting patient when she was not able to find her lips when trying to take a sip. Patient exhibited trace amount of anterior spillage which appears to be secondary to patient not attending full when taking sip (turning head away slightly, etc). No overt s/s aspiration or penetration, however this was a very limited amount of PO's (two very small sips from cup). SLP is recommending patient continue on previously prescribed diet of Dys 1 solids, thin liquids and plan for SLP to f/u next 1-2 dates to assess for diet toleration and ability to advance. ?SLP Visit Diagnosis: Dysphagia, unspecified (R13.10) ?   ?Aspiration Risk ? Mild aspiration risk  ?  ?Diet Recommendation Dysphagia 1 (Puree);Thin liquid  ? ?Liquid Administration via: Cup;No straw ?Medication Administration: Whole meds with puree ?Supervision: Full supervision/cueing for compensatory strategies;Staff to assist with self feeding;Patient able to self feed ?Compensations: Slow rate;Small sips/bites ?Postural Changes: Seated upright at 90 degrees;Remain upright for at least 30 minutes after po intake  ?  ?Other  Recommendations Oral Care Recommendations: Oral care BID;Staff/trained caregiver to provide oral care   ? ?Recommendations for follow up therapy are one component of a multi-disciplinary discharge planning process, led by the attending physician.  Recommendations may be updated based on patient status, additional functional criteria and insurance authorization. ? ?Follow up Recommendations No SLP follow up  ? ? ?  ?Assistance Recommended at Discharge None  ?Functional Status Assessment Patient has had a recent decline in their functional status and demonstrates the ability to make significant improvements in function in a reasonable and predictable amount of time.  ?Frequency and Duration min 1 x/week  ?1 week ?  ?   ? ?Prognosis Prognosis for Safe Diet Advancement: Good  ? ?  ? ?Swallow Study   ?General Date of Onset: 07/15/21 ?HPI:  Patient is 76 y.o. 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 She was started on Dys 1 (puree) solids, thin liquids by surgery but SLP ordered to assess for potential oropharyngeal phase of dysphagia. PMH significant for anxiety, DM, RA, GERD, glaucoma, HTN, HLD, thyroid disease, CAD, obesity, PTSD. ?Type of Study: Bedside Swallow Evaluation ?Previous Swallow Assessment: none found ?Diet Prior to this Study: Dysphagia 1 (puree);Thin liquids ?Temperature Spikes Noted: No ?Respiratory Status: Room air ?History of Recent Intubation: No ?Behavior/Cognition: Alert;Cooperative;Pleasant mood;Confused ?Oral Cavity Assessment: Within Functional Limits ?Oral Care Completed by SLP: No ?Oral Cavity - Dentition: Adequate natural dentition ?Vision: Functional for self-feeding ?Self-Feeding Abilities: Needs assist;Needs set up ?Patient Positioning: Upright in chair ?Baseline Vocal Quality: Normal ?Volitional Swallow: Able to elicit  ?  ?Oral/Motor/Sensory Function Overall Oral Motor/Sensory Function: Within functional limits   ?Ice Chips     ?Thin Liquid Thin Liquid: Impaired ?Presentation: Cup ?Oral Phase Impairments: Poor awareness of bolus ?Oral Phase Functional Implications: Other (comment) ?Pharyngeal  Phase Impairments: Other (comments) ?Other Comments: No overt s/s aspiration or penetration with very small amount of liquids consumed (two very small sips)  ?  ?Nectar Thick     ?Honey Thick     ?Puree Puree: Not tested   ?Solid ? ? ?  Solid: Not tested  ? ?  ? ?Deivi Huckins T.  Jaci Standard, [place], [place] ?Speech Therapy ? ? ? ?

## 2021-07-18 NOTE — Progress Notes (Signed)
Deborah Farley [id number] 1946/03/11  CARE TEAM:  PCP: Abigail Miyamoto, MD  Outpatient Care Team: Patient Care Team: Abigail Miyamoto, MD as PCP - General (Family Medicine) Revankar, Aundra Dubin, MD as PCP - Cardiology (Cardiology) Earvin Hansen, [facility] (Inactive) as Pharmacist (Pharmacist) Karie Soda, MD as Consulting Physician (General Surgery) Meryl Dare, MD as Consulting Physician (Gastroenterology) Revankar, Aundra Dubin, MD as Consulting Physician (Cardiology) Buck Mam, LCSW as Social Worker (Licensed Clinical Social Worker)  Inpatient Treatment Team: Treatment Team: Attending Provider: Karie Soda, MD; Technician: Vella Raring, NT; Pharmacist: Rollene Fare, [facility]; Registered Nurse: Edison Simon, RN; Physical Therapist: Anitra Lauth, PT; Utilization Review: Oval Linsey, RN   Problem List:   Principal Problem:   Incarcerated hiatal hernia Active Problems:   Progressive angina (HCC)   Hyperlipidemia   Hypertension   Hypothyroidism   Obesity (BMI 35.0-39.9 without comorbidity)   Acid reflux   Rheumatoid arthritis (HCC)   History of diabetes mellitus   Chronic systolic congestive heart failure (HCC)   Gait disturbance   Memory difficulty   Degeneration of lumbar intervertebral disc   Hypoxia   3 Days Post-Op  07/15/2021  POST-OPERATIVE DIAGNOSIS:  INCARCERATED PARAESOPHAGEAL HIATAL HERNIA   PROCEDURE:   1. ROBOTIC reduction of paraesophageal hiatal hernia 2. Type II mediastinal dissection. 3.  Left diaphragm release 4. Primary repair of hiatal hernia over pledgets.  5. Anterior & posterior gastropexy. 6. Toupet (partial 270deg posterior) fundoplication x 4 cm 7. Mesh reinforcement with absorbable mesh   SURGEON:  Ardeth Sportsman, MD  OR FINDINGS:    Moderate-sized paraesophageal hiatal hernia with mesenteric axial volvulus without obstruction or gangrene involving  100% of the stomach in the  mediastinum.  There was a 9 x 8 cm hiatal defect.   Left sagittal diaphragmatic release made 5 cm from the left crus.  It is a primary repair over pledgets. Mesh reinforcement was used with Mesh was used: Phasix Mesh (a knitted monofilament mesh scaffold using Poly-4-hydroxybutyrate (P4HB), a biologically derived, fully resorbable material)   The patient has a 4 cm Toupet fundoplication -posterior partial fundoplication done given her advanced age and obesity despite manometry showing no major dysmotility.  The patient has had anterior and posterior gastropexy.  Assessment  Slowly improving  [facility] Stay = 3 days)  Plan:  Diuresis.  See if can ambulate without help of oxygen.  Palliate sore throat.  Most likely rated to endotracheal intubation along with chronic heartburn.  Dysphagia 1 diet next few weeks.  Physical Occupational Therapy reevaluate.  Seems to been from home health.  Keep drain for now.  May remove prior to discharge since not high-volume.  Adjust pain control regimen.  Seems to be sedated with Dilaudid.  Tramadol may be enough.  Stay on acetaminophen gabapentin.  Tramadol as preferred narcotic.  Hypertension control.  Hypothyroidism control.  -VTE prophylaxis- SCDs, etc  -mobilize as tolerated to help recovery  Disposition:  Disposition:  The patient is from: Home  Anticipate discharge to:  Home with Home Health  Anticipated Date of Discharge is:  March 16,2023    Barriers to discharge:  Pending Clinical improvement (more likely than not)  Patient currently is NOT MEDICALLY STABLE for discharge from the hospital from a surgery standpoint.      I reviewed nursing notes, last 24 h vitals and pain scores, last 48 h intake and output, last 24 h labs and trends, and last 24 h imaging results. I  have reviewed this patient's available data, including medical history, events of note, test results, etc as part of my evaluation.  A significant portion of  that time was spent in counseling.  Care during the described time interval was provided by me.  This care required moderate level of medical decision making.  07/18/2021    Subjective: (Chief complaint)  Started to wean off oxygen but prefers to stay in bed.  Less confused.  Less pain.  Complains of some sore throat.  Some dysphagia to the dysphagia 1 diet but no retching.    Objective:  Vital signs:  Vitals:   07/17/21 1101 07/17/21 1207 07/17/21 2033 07/18/21 0536  BP: 121/61 136/73 (!) 142/77 128/64  Pulse: 62 63 79 71  Resp:   18 16  Temp:  (!) 97.5 F (36.4 C) 97.9 F (36.6 C) 98.7 F (37.1 C)  TempSrc:   Oral Oral  SpO2:  95% 92% 91%  Weight:      Height:        Last BM Date : 07/14/21  Intake/Output   Yesterday:  03/12 0701 - 03/13 0700 In: 600 [P.O.:600] Out: 60 [Drains:60] This shift:  No intake/output data recorded.  Bowel function:  Flatus: YES  BM:  No  Drain: Serosanguinous   Physical Exam:  General: Pt awake/alert in no acute distress Eyes: PERRL, normal EOM.  Sclera clear.  No icterus Neuro: CN II-XII intact w/o focal sensory/motor deficits. Lymph: No head/neck/groin lymphadenopathy Psych:  No delerium/psychosis/paranoia.  Oriented x 4 HENT: Normocephalic, Mucus membranes moist.  No thrush Neck: Supple, No tracheal deviation.  No obvious thyromegaly Chest: No pain to chest wall compression.  Good respiratory excursion.  No audible wheezing CV:  Pulses intact.  Regular rhythm.  No major extremity edema MS: Normal AROM mjr joints.  No obvious deformity  Abdomen: Soft.  Nondistended.  Mildly tender at incisions only.  No evidence of peritonitis.  No incarcerated hernias.  Ext:   No deformity.  No mjr edema.  No cyanosis Skin: No petechiae / purpurea.  No major sores.  Warm and dry    Results:   Cultures: Recent Results (from the past 720 hour(s))  SARS CORONAVIRUS 2 (TAT 6-24 HRS)     Status: None   Collection Time:  07/12/21 11:37 AM  Result Value Ref Range Status   SARS Coronavirus 2 NEGATIVE NEGATIVE Final    Comment: (NOTE) SARS-CoV-2 target nucleic acids are NOT DETECTED.  The SARS-CoV-2 RNA is generally detectable in upper and lower respiratory specimens during the acute phase of infection. Negative results do not preclude SARS-CoV-2 infection, do not rule out co-infections with other pathogens, and should not be used as the sole basis for treatment or other patient management decisions. Negative results must be combined with clinical observations, patient history, and epidemiological information. The expected result is Negative.  Fact Sheet for Patients: HairSlick.nohttps://www.fda.gov/media/138098/download  Fact Sheet for Healthcare Providers: quierodirigir.comhttps://www.fda.gov/media/138095/download  This test is not yet approved or cleared by the [place] FDA and  has been authorized for detection and/or diagnosis of SARS-CoV-2 by FDA under an Emergency Use Authorization (EUA). This EUA will remain  in effect (meaning this test can be used) for the duration of the COVID-19 declaration under Se ction 564(b)(1) of the Act, 21 U.S.C. section 360bbb-3(b)(1), unless the authorization is terminated or revoked sooner.  Performed at [facility], [address] [place], [place] [address]     Labs: Results for orders placed or performed during  the hospital encounter of 07/15/21 (from the past 48 hour(s))  CBC     Status: Abnormal   Collection Time: 07/18/21  3:36 AM  Result Value Ref Range   WBC 13.2 (H) 4.0 - 10.5 K/uL   RBC 3.50 (L) 3.87 - 5.11 MIL/uL   Hemoglobin 11.7 (L) 12.0 - 15.0 g/dL   HCT 16.1 (L) 09.6 - 04.5 %   MCV 100.3 (H) 80.0 - 100.0 fL   MCH 33.4 26.0 - 34.0 pg   MCHC 33.3 30.0 - 36.0 g/dL   RDW 40.9 81.1 - 91.4 %   Platelets 141 (L) 150 - 400 K/uL   nRBC 0.0 0.0 - 0.2 %    Comment: Performed at [facility], [address] [place], [place] [address]   Comprehensive metabolic panel     Status: Abnormal   Collection Time: 07/18/21  3:36 AM  Result Value Ref Range   Sodium 141 135 - 145 mmol/L   Potassium 3.4 (L) 3.5 - 5.1 mmol/L    Comment: DELTA CHECK NOTED   Chloride 108 98 - 111 mmol/L   CO2 26 22 - 32 mmol/L   Glucose, Bld 104 (H) 70 - 99 mg/dL    Comment: Glucose reference range applies only to samples taken after fasting for at least 8 hours.   BUN 17 8 - 23 mg/dL   Creatinine, Ser 6.21 0.44 - 1.00 mg/dL   Calcium 8.3 (L) 8.9 - 10.3 mg/dL   Total Protein 5.6 (L) 6.5 - 8.1 g/dL   Albumin 3.0 (L) 3.5 - 5.0 g/dL   AST 308 (H) 15 - 41 U/L   ALT 101 (H) 0 - 44 U/L   Alkaline Phosphatase 71 38 - 126 U/L   Total Bilirubin 0.5 0.3 - 1.2 mg/dL   GFR, Estimated >65 >78 mL/min    Comment: (NOTE) Calculated using the CKD-EPI Creatinine Equation (2021)    Anion gap 7 5 - 15    Comment: Performed at [facility], [address], [place], [place] [address]    Imaging / Studies: DG ESOPHAGUS W SINGLE CM (SOL OR THIN BA)  Result Date: 07/17/2021 CLINICAL DATA:  1 day s/p hiatal hernia repair with toupet fundiplication EXAM: ESOPHAGRAM TECHNIQUE: Limited single contrast examination was performed using Omnipaque 300. This exam was performed by Sheliah Plane, PA-C, and was supervised and interpreted by Jeronimo Greaves. FLUOROSCOPY: Radiation Exposure Index (as provided by the fluoroscopic device): 26.8 mGy COMPARISON:  Preoperative upper GI 04/25/2021. FINDINGS: Patient swallows contrast without difficulty. Contrast traverses the esophageal lumen swiftly but is delayed in transit through the GE junction/presumed site of wrap resulting in multiple tertiary contractions. Contrast remains intraluminal without evidence of postsurgical leak. IMPRESSION: Delayed passage of contrast through the GE junction suggestive of postoperative edema. No contrast extravasation/evidence for postoperative leak. Electronically Signed   By: Jeronimo Greaves M.D.   On: 07/17/2021 10:08    Medications / Allergies: per chart  Antibiotics: Anti-infectives (From admission, onward)    Start     Dose/Rate Route Frequency Ordered Stop   07/15/21 0600  cefTRIAXone (ROCEPHIN) 2 g in sodium chloride 0.9 % 100 mL IVPB       See Hyperspace for full Linked Orders Report.   2 g 200 mL/hr over 30 Minutes Intravenous On call to O.R. 07/15/21 0536 07/15/21 0745   07/15/21 0600  metroNIDAZOLE (FLAGYL) IVPB 500 mg       See Hyperspace for full Linked Orders Report.  500 mg 100 mL/hr over 60 Minutes Intravenous On call to O.R. 07/15/21 0536 07/15/21 0759         Note: Portions of this report may have been transcribed using voice recognition software. Every effort was made to ensure accuracy; however, inadvertent computerized transcription errors may be present.   Any transcriptional errors that result from this process are unintentional.    Ardeth Sportsman, MD, FACS, MASCRS Esophageal, Gastrointestinal & Colorectal Surgery Robotic and Minimally Invasive Surgery  [facility], [facility]   [facility]  [address] [place], [place] [address] [phone] Fax [phone] Main  CONTACT INFORMATION:  Weekday (9AM-5PM): Call [facility] main office at [phone]  Weeknight (5PM-9AM) or Weekend/Holiday: Check [email or web address] (password " [email or web address]") for General Surgery [facility] coverage  (Please, do not use SecureChat as it is not reliable communication to operating surgeons for immediate patient care)      07/18/2021  8:22 AM

## 2021-07-18 NOTE — Consult Note (Signed)
Initial Consultation Note   Patient: Deborah Farley [id number] DOB: November 25, 1945 PCP: Lillard Anes, MD DOA: 07/15/2021 DOS: the patient was seen and examined on 07/18/2021 Primary service: Michael Boston, MD  Referring physician: Michael Boston, MD Reason for consult: Altered mental status.  Assessment/Plan: Principal Problem:   Acute metabolic encephalopathy History of   Memory difficulty CT head with no acute findings.Marland Kitchen TSH was normal. Normal ammonia level. Check B12 level. Discontinue sedating meds: Alprazolam, diphenhydramine, methocarbamol Methocarbamol, tramadol and trazodone. Gabapentin discontinued earlier. Change Abilify 2 mg 2 nightly starting tomorrow evening.    Incarcerated hiatal hernia Continue postop care per Dr. Elinor Parkinson.  Active Problems:   Transaminitis Received methotrexate yesterday. She is on pravastatin as well. Check LFTs in AM. Check ammonia level. Further work-up depending on results. Could this be affecting the clearance of medications? I will hold daily pravastatin pending follow-up hepatic functions.    Mild protein-calorie malnutrition (HCC) Protein supplementation. Consider nutritional services evaluation.    Thrombocytopenia ([place]) Follow-up CBC in AM.    Progressive angina (HCC) Continue beta-blocker. Nitroglycerin as needed.    Hyperlipidemia Hold statin.    Hypertension Continue metoprolol 25 mg p.o. twice daily.    Hypothyroidism Check TSH level. Continue levothyroxine.    Obesity (BMI 35.0-39.9 without comorbidity) Lifestyle modifications. Follow-up with PCP.    Acid reflux Continue pantoprazole.    Rheumatoid arthritis (HCC) On methotrexate. Follow-up with rheumatology as an outpatient.    History of diabetes mellitus Begin CBG monitoring with [facility].    Chronic systolic congestive heart failure (HCC) No signs of decompensation.    Gait disturbance Continue PT/OT  [facility] will continue to follow the  patient.  HPI: Deborah Farley is a 76 y.o. female with past medical history as below who underwent a robotic reduction of paraesophageal hiatal hernia 3 days ago and we are seeing due to confusion.  She was sleeping when I saw her, but easily arousable.  Afterwards, alert, oriented x3 with some difficulty recalling the date.  She did not have any specific complaints other than a frontal headache and postop abdominal pain.  She stated she did not need any medication for pain at that time.  Review of Systems: As mentioned in the history of present illness. All other systems reviewed and are negative. Past Medical History:  Diagnosis Date   Acid reflux 02/29/2016   Acute sinusitis 03/19/2020   Allergy    Chronic idiopathic constipation 07/02/2015   Chronic pain syndrome 11/30/2020   Coronary artery disease involving native coronary artery of native heart with unstable angina pectoris ([place]) 07/02/2015   Formatting of this note might be different from the original. 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    History of diabetes mellitus 05/31/2016   History of gastroesophageal reflux (GERD) 05/31/2016   History of hypothyroidism 05/31/2016   Hyperlipidemia 02/29/2016   Hypertension    Hypothyroidism    Major depressive disorder, recurrent episode, moderate ([place]) 07/02/2015   Malaise and fatigue 07/02/2015   Memory difficulty    Obesity (BMI 35.0-39.9 without comorbidity) 02/29/2016   Pre-diabetes  Progressive angina ([place]) 08/11/2015   PTSD (post-traumatic stress disorder) 11/30/2020   Rheumatoid arthritis ([place])  04/12/2016   Spastic esophagus    Suspected pulmonary embolism 03/11/2019   Past Surgical History:  Procedure Laterality Date   ABDOMINAL HYSTERECTOMY     BACK SURGERY     CARDIAC CATHETERIZATION N/A 08/12/2015   Procedure: Left Heart Cath and Coronary Angiography;  Surgeon: Adrian Prows, MD;  Location: [place] [facility];  Service: Cardiovascular;  Laterality: N/A;   ESOPHAGEAL MANOMETRY N/A 07/01/2021   Procedure: ESOPHAGEAL MANOMETRY (EM);  Surgeon: Ladene Artist, MD;  Location: [facility];  Service: Endoscopy;  Laterality: N/A;   LEFT HEART CATH AND CORONARY ANGIOGRAPHY N/A 02/20/2018   Procedure: LEFT HEART CATH AND CORONARY ANGIOGRAPHY;  Surgeon: Troy Sine, MD;  Location: [place] [facility];  Service: Cardiovascular;  Laterality: N/A;   LEFT HEART CATH AND CORONARY ANGIOGRAPHY N/A 01/19/2020   Procedure: LEFT HEART CATH AND CORONARY ANGIOGRAPHY;  Surgeon: Leonie Man, MD;  Location: [place] [facility];  Service: Cardiovascular;  Laterality: N/A;   TOTAL ABDOMINAL HYSTERECTOMY W/ BILATERAL SALPINGOOPHORECTOMY     XI ROBOTIC ASSISTED HIATAL HERNIA REPAIR N/A 07/15/2021   Procedure: robotic paraesophageal hiatal hernia with fundoplication, diaphragm release and mesh repair and bilateral TAP block;  Surgeon: Michael Boston, MD;  Location: [facility];  Service: General;  Laterality: N/A;   Social History:  reports that she has never smoked. She has never been exposed to tobacco smoke. She has never used smokeless tobacco. She reports that she does not drink alcohol and does not use drugs.  Allergies  Allergen Reactions   Codeine Anaphylaxis    Tolerates tramadol fine   Iodine Anaphylaxis   Shellfish Allergy Anaphylaxis         Family History  Problem Relation Age of Onset   Heart disease Mother    Diabetes Mother    Hypertension Mother        after birth   Heart disease Father    Heart attack Father 86   Hypertension Sister    Diabetes Sister    Diabetes Maternal  Grandmother    Kidney disease Daughter        kidney removed after birth   Diabetes Maternal Aunt        all 7 maternal Aunts   Other Son        Adopted   Breast cancer Neg Hx     Prior to Admission medications   Medication Sig Start Date End Date Taking? Authorizing Provider  ARIPiprazole (ABILIFY) 2 MG tablet Take 1 tablet (2 mg total) by mouth daily. 06/24/21  Yes Lillard Anes, MD  aspirin EC 81 MG tablet Take 81 mg by mouth daily.   Yes [provider]  buPROPion (WELLBUTRIN XL) 300 MG 24 hr tablet Take 1 tablet (300 mg total) by mouth at bedtime. 06/24/21  Yes Lillard Anes, MD  Cholecalciferol (VITAMIN D) 2000 units CAPS Take 2,000 Units by mouth daily.   Yes [provider]  diltiazem (CARDIZEM CD) 120 MG 24 hr capsule Take 1 capsule (120 mg total) by mouth daily. 06/24/21  Yes Revankar, Reita Cliche, MD  folic acid (FOLVITE) 1 MG tablet Take 1 tablet (1 mg total) by mouth daily. 06/24/21  Yes Lillard Anes, MD  isosorbide mononitrate (IMDUR) 30 MG 24 hr tablet Take 30 mg by mouth daily.   Yes [provider]  latanoprost (XALATAN) 0.005 % ophthalmic solution Place 1  drop into both eyes at bedtime.   Yes [provider]  levothyroxine (SYNTHROID) 25 MCG tablet Take 1 tablet (25 mcg total) by mouth daily before breakfast. 06/29/21  Yes Lillard Anes, MD  methotrexate (RHEUMATREX) 2.5 MG tablet Take 10 tablets (25 mg total) by mouth every Sunday. Caution:Chemotherapy. Protect from light. 07/03/21  Yes Lillard Anes, MD  metoprolol tartrate (LOPRESSOR) 25 MG tablet Take 1 tablet (25 mg total) by mouth 2 (two) times daily. 06/29/21  Yes Lillard Anes, MD  Multiple Vitamin (MULTIVITAMIN) capsule Take 1 capsule by mouth daily.   Yes [provider]  omeprazole (PRILOSEC) 20 MG capsule Take 1 capsule (20 mg total) by mouth in the morning and at bedtime. 06/24/21  Yes Lillard Anes, MD  ondansetron  (ZOFRAN) 4 MG tablet Take 1 tablet (4 mg total) by mouth every 8 (eight) hours as needed for nausea. 07/15/21  Yes Michael Boston, MD  polyethylene glycol (MIRALAX / GLYCOLAX) 17 g packet Take 17 g by mouth daily as needed for mild constipation. 06/24/21  Yes Lillard Anes, MD  pravastatin (PRAVACHOL) 20 MG tablet Take 1 tablet (20 mg total) by mouth daily. 06/24/21  Yes Lillard Anes, MD  promethazine (PHENERGAN) 25 MG suppository Place 1 suppository (25 mg total) rectally every 6 (six) hours as needed for nausea. 07/15/21  Yes Michael Boston, MD  sertraline (ZOLOFT) 100 MG tablet Take 0.5 tablets (50 mg total) by mouth daily. 06/24/21  Yes Lillard Anes, MD  traZODone (DESYREL) 50 MG tablet Take 1 tablet (50 mg total) by mouth at bedtime. 06/24/21  Yes Lillard Anes, MD  Vitamin D, Ergocalciferol, (DRISDOL) 1.25 MG (50000 UNIT) CAPS capsule Take 1 capsule (50,000 Units total) by mouth every Sunday. 06/26/21  Yes Lillard Anes, MD  ALPRAZolam Duanne Moron) 0.5 MG tablet Take 0.5 tablets (0.25 mg total) by mouth daily as needed for anxiety. 06/26/21   Lillard Anes, MD  bumetanide (BUMEX) 1 MG tablet Take 1 tablet (1 mg total) by mouth daily as needed (swelling). 06/24/21   Lillard Anes, MD  nitroGLYCERIN (NITROSTAT) 0.4 MG SL tablet Place 1 tablet (0.4 mg total) under the tongue every 5 (five) minutes as needed for chest pain. 06/24/21   Revankar, Reita Cliche, MD  traMADol (ULTRAM) 50 MG tablet Take 1 tablet (50 mg total) by mouth every 6 (six) hours as needed. 06/29/21   Lillard Anes, MD    Physical Exam: Vitals:   07/17/21 1207 07/17/21 2033 07/18/21 0536 07/18/21 1354  BP: 136/73 (!) 142/77 128/64 121/62  Pulse: 63 79 71 66  Resp:  18 16 18   Temp: (!) 97.5 F (36.4 C) 97.9 F (36.6 C) 98.7 F (37.1 C) 98.4 F (36.9 C)  TempSrc:  Oral Oral Oral  SpO2: 95% 92% 91% 91%  Weight:      Height:       Physical Exam Constitutional:       Appearance: She is obese.  HENT:     Head: Normocephalic.     Mouth/Throat:     Mouth: Mucous membranes are dry.  Eyes:     Pupils: Pupils are equal, round, and reactive to light.  Cardiovascular:     Rate and Rhythm: Normal rate and regular rhythm.  Pulmonary:     Effort: Pulmonary effort is normal.     Breath sounds: Normal breath sounds.  Abdominal:     General: Bowel sounds are normal.  Palpations: Abdomen is soft.  Musculoskeletal:     Cervical back: Neck supple.     Right lower leg: No edema.     Left lower leg: No edema.  Skin:    General: Skin is warm and dry.  Neurological:     General: No focal deficit present.     Mental Status: She is easily aroused. She is disoriented.  Psychiatric:        Behavior: Behavior is cooperative.    Data Reviewed:   Results are pending, will review when available.   Family Communication:  Primary team communication: Plan discussed with Dr. Johney Maine. Thank you very much for involving [place] in the care of your patient.  Author: Reubin Milan, MD 07/18/2021 4:56 PM  For on call review [email or web address].   This document was prepared using Dragon voice recognition software and may contain some unintended transcription errors.

## 2021-07-18 NOTE — Progress Notes (Signed)
OT Cancellation Note ? ?Patient Details ?Name: Deborah Farley ?MRN: [id number] ?DOB: 04-06-1946 ? ? ?Cancelled Treatment:    Reason Eval/Treat Not Completed: Patient at procedure or test/ unavailable. Pt getting ready to go down for a test due to increased confusion and decreased balance.  ? ?[place], OTR/L ?Acute Rehab Services ?Pager [phone] ?Office [phone] ? ? ? ?Almon Register ?07/18/2021, 2:39 PM ?

## 2021-07-18 NOTE — Progress Notes (Addendum)
Physical Therapy Treatment ?Patient Details ?Name: Deborah CrazierJoyce Senn ?MRN: [id number] ?DOB: 04/23/1946 ?Today's Date: 07/18/2021 ? ? ?History of Present Illness Patient is 76 y.o. female s/p hiatal hernia repair on 07/15/21 with PMH significant for anxiety, DM, RA, GERD, glaucoma, HTN, HLD, thyroid disease, CAD, obesity, PTSD. ? ?  ?PT Comments  ? ? Pt presents with the HPI above and impairments detailed below. Pt min assist for bed mobility, min guard for transfers, and min guard for ambulation with +2 for recliner follow for safety. Pt reported some dizziness with sitting EOB but reported this to be baseline, monitored throughout session. SpO2 monitored throughout and found to be in the 70s/80s% during ambulation via pulse ox, instructed pt to sit and perform pursed lip breathing without significant recovery. Utilized dynamap and found SpO2 to be 91%. Placed pt on 1LO2 via [place], O2 sats recovered and RN notified. Per previous PT, pt is less oriented to place and time than during her evaluation. Updating rehabilitation recommendation at discharge to HHPT secondary to mobility status and orientation. We will continue to follow the pt acutely to promote functional independence. ?   ?Recommendations for follow up therapy are one component of a multi-disciplinary discharge planning process, led by the attending physician.  Recommendations may be updated based on patient status, additional functional criteria and insurance authorization. ? ?Follow Up Recommendations ? Home health PT ?  ?  ?Assistance Recommended at Discharge Intermittent Supervision/Assistance  ?Patient can return home with the following A little help with bathing/dressing/bathroom;Assistance with cooking/housework;Assist for transportation;Help with stairs or ramp for entrance;Direct supervision/assist for medications management;A lot of help with walking and/or transfers ?  ?Equipment Recommendations ? Rolling walker (2 wheels);BSC/3in1  ?  ?Recommendations  for Other Services   ? ? ?  ?Precautions / Restrictions Precautions ?Precautions: Fall ?Precaution Comments: Rt UQ JP drain ?Restrictions ?Weight Bearing Restrictions: No  ?  ? ?Mobility ? Bed Mobility ?Overal bed mobility: Needs Assistance ?Bed Mobility: Supine to Sit ?  ?  ?Supine to sit: Min assist ?  ?  ?General bed mobility comments: Pt required min assist for trunk assist and LE advancement off bed with max verbal cuing for hand placement and sequencing. Pt was seated EOB and then reported dizziness and laid back down suddely. After ~30s rest, pt able to resume seated position EOB with min assist. ?  ? ?Transfers ?Overall transfer level: Needs assistance ?Equipment used: Rolling walker (2 wheels) ?Transfers: Sit to/from Stand ?Sit to Stand: Min guard ?  ?  ?  ?  ?  ?General transfer comment: Pt min guard with moderate multimodal cues for sequencing and hand placement. Pt demonstrated some confusion during task requiring redirection. Pt able to power up with one hand on bed and other on RW with PT steadying RW. Pt attempted to utilize PT for steadying but accepted redirection to use RW with BUE support. ?  ? ?Ambulation/Gait ?Ambulation/Gait assistance: Min guard, +2 safety/equipment ?Gait Distance (Feet): 75 Feet ?Assistive device: Rolling walker (2 wheels) ?Gait Pattern/deviations: Decreased stride length, Step-through pattern, Drifts right/left ?Gait velocity: Decreased ?  ?  ?General Gait Details: Pt ambulated in hallway with +2 recliner follow for safety. Decreased gait speed noted with mod VC for proximity to device. Pt tended to drift R but was able to correct with cuing. Monitored SpO2 via pulse ox and when pt requested seated break after ~9075ft was found to be at 80% on RA, directed pt to perform pulsed lip breathing. Sats did not recover so pt  was wheeled back to room and placed on dynamap, SpO2 found to be 91% on RA. Placed on 1LO2 via [place] and sats remained above 90%. RN notified. ? ? ?Stairs ?  ?  ?   ?  ?  ? ? ?Wheelchair Mobility ?  ? ?Modified Rankin (Stroke Patients Only) ?  ? ? ?  ?Balance Overall balance assessment: Needs assistance ?Sitting-balance support: Feet supported ?Sitting balance-Leahy Scale: Fair ?Sitting balance - Comments: Pt was able to maintain seated balance but did lay back after feeling dizzy. ?  ?Standing balance support: During functional activity, Reliant on assistive device for balance, Bilateral upper extremity supported ?Standing balance-Leahy Scale: Fair ?Standing balance comment: Pt reliant on BUE support on RW during functional mobility. ?  ?  ?  ?  ?  ?  ?  ?  ?  ?  ?  ?  ? ?  ?Cognition Arousal/Alertness: Awake/alert ?Behavior During Therapy: [facility] for tasks assessed/performed ?Overall Cognitive Status: Impaired/Different from baseline ?Area of Impairment: Orientation, Memory, Awareness ?  ?  ?  ?  ?  ?  ?  ?  ?Orientation Level: Disoriented to, Time, Place ?  ?Memory: Decreased short-term memory ?  ?  ?Awareness: Emergent ?  ?General Comments: Pt alert to self, president, month (unable to state day of the week). Pt unable to report her recent surgery or exact location other than "hospital" without cuing. Per previous PT, this is a change in status from two days ago. ?  ?  ? ?  ?Exercises   ? ?  ?General Comments   ?  ?  ? ?Pertinent Vitals/Pain Pain Assessment ?Pain Assessment: Faces ?Faces Pain Scale: Hurts a little bit ?Pain Location: abdomen ?Pain Descriptors / Indicators: Discomfort ?Pain Intervention(s): Limited activity within patient's tolerance, Repositioned  ? ? ?Home Living   ?  ?  ?  ?  ?  ?  ?  ?  ?  ?   ?  ?Prior Function    ?  ?  ?   ? ?PT Goals (current goals can now be found in the care plan section) Acute Rehab PT Goals ?Patient Stated Goal: get back home and regain strength ?PT Goal Formulation: With patient ?Time For Goal Achievement: 07/30/21 ?Potential to Achieve Goals: Good ?Progress towards PT goals: Progressing toward goals ? ?  ?Frequency ? ? ? Min  3X/week ? ? ? ?  ?PT Plan Discharge plan needs to be updated  ? ? ?Co-evaluation   ?  ?  ?  ?  ? ?  ?AM-PAC PT "6 Clicks" Mobility   ?Outcome Measure ? Help needed turning from your back to your side while in a flat bed without using bedrails?: A Little ?Help needed moving from lying on your back to sitting on the side of a flat bed without using bedrails?: A Little ?Help needed moving to and from a bed to a chair (including a wheelchair)?: A Little ?Help needed standing up from a chair using your arms (e.g., wheelchair or bedside chair)?: A Little ?Help needed to walk in hospital room?: A Little ?Help needed climbing 3-5 steps with a railing? : A Lot ?6 Click Score: 17 ? ?  ?End of Session Equipment Utilized During Treatment: Gait belt;Oxygen ?Activity Tolerance: Patient tolerated treatment well ?Patient left: in chair;with call bell/phone within reach;with chair alarm set ?Nurse Communication: Mobility status;Other (comment) (Oxygen status  (see gait note).) ?PT Visit Diagnosis: Muscle weakness (generalized) (M62.81);Difficulty in walking, not elsewhere classified (R26.2);Unsteadiness on  feet (R26.81) ?  ? ? ?Time: 1028-1100 ?PT Time Calculation (min) (ACUTE ONLY): 32 min ? ?Charges:  $Gait Training: 8-22 mins ?$Therapeutic Activity: 8-22 mins          ?          ? ?Jamesetta Geralds, PT, DPT ?[facility] Department ?Office: [phone] ?Pager: [phone] ? ? ?Jamesetta Geralds ?07/18/2021, 1:28 PM ? ?

## 2021-07-19 ENCOUNTER — Inpatient Hospital Stay (HOSPITAL_COMMUNITY): Payer: Medicare Other

## 2021-07-19 DIAGNOSIS — E876 Hypokalemia: Secondary | ICD-10-CM | POA: Diagnosis present

## 2021-07-19 DIAGNOSIS — K5900 Constipation, unspecified: Secondary | ICD-10-CM | POA: Insufficient documentation

## 2021-07-19 DIAGNOSIS — R079 Chest pain, unspecified: Secondary | ICD-10-CM | POA: Insufficient documentation

## 2021-07-19 DIAGNOSIS — J90 Pleural effusion, not elsewhere classified: Secondary | ICD-10-CM | POA: Diagnosis present

## 2021-07-19 DIAGNOSIS — R0789 Other chest pain: Secondary | ICD-10-CM

## 2021-07-19 DIAGNOSIS — K440 Diaphragmatic hernia with obstruction, without gangrene: Secondary | ICD-10-CM | POA: Diagnosis not present

## 2021-07-19 DIAGNOSIS — J9601 Acute respiratory failure with hypoxia: Secondary | ICD-10-CM | POA: Diagnosis present

## 2021-07-19 HISTORY — DX: Chest pain, unspecified: R07.9

## 2021-07-19 HISTORY — DX: Acute respiratory failure with hypoxia: J96.01

## 2021-07-19 HISTORY — DX: Constipation, unspecified: K59.00

## 2021-07-19 HISTORY — DX: Other chest pain: R07.89

## 2021-07-19 HISTORY — DX: Pleural effusion, not elsewhere classified: J90

## 2021-07-19 HISTORY — DX: Hypokalemia: E87.6

## 2021-07-19 LAB — CBC
HCT: 39.4 % (ref 36.0–46.0)
Hemoglobin: 13.2 g/dL (ref 12.0–15.0)
MCH: 33.4 pg (ref 26.0–34.0)
MCHC: 33.5 g/dL (ref 30.0–36.0)
MCV: 99.7 fL (ref 80.0–100.0)
Platelets: 151 10*3/uL (ref 150–400)
RBC: 3.95 MIL/uL (ref 3.87–5.11)
RDW: 15.3 % (ref 11.5–15.5)
WBC: 11.5 10*3/uL — ABNORMAL HIGH (ref 4.0–10.5)
nRBC: 0 % (ref 0.0–0.2)

## 2021-07-19 LAB — MAGNESIUM: Magnesium: 2.1 mg/dL (ref 1.7–2.4)

## 2021-07-19 LAB — COMPREHENSIVE METABOLIC PANEL
ALT: 99 U/L — ABNORMAL HIGH (ref 0–44)
AST: 95 U/L — ABNORMAL HIGH (ref 15–41)
Albumin: 3.2 g/dL — ABNORMAL LOW (ref 3.5–5.0)
Alkaline Phosphatase: 73 U/L (ref 38–126)
Anion gap: 9 (ref 5–15)
BUN: 15 mg/dL (ref 8–23)
CO2: 26 mmol/L (ref 22–32)
Calcium: 8.1 mg/dL — ABNORMAL LOW (ref 8.9–10.3)
Chloride: 104 mmol/L (ref 98–111)
Creatinine, Ser: 0.74 mg/dL (ref 0.44–1.00)
GFR, Estimated: >60 mL/min (ref >60)
Glucose, Bld: 112 mg/dL — ABNORMAL HIGH (ref 70–99)
Potassium: 3.4 mmol/L — ABNORMAL LOW (ref 3.5–5.1)
Sodium: 139 mmol/L (ref 135–145)
Total Bilirubin: 0.9 mg/dL (ref 0.3–1.2)
Total Protein: 5.9 g/dL — ABNORMAL LOW (ref 6.5–8.1)

## 2021-07-19 LAB — GLUCOSE, CAPILLARY
Glucose-Capillary: 109 mg/dL — ABNORMAL HIGH (ref 70–99)
Glucose-Capillary: 116 mg/dL — ABNORMAL HIGH (ref 70–99)
Glucose-Capillary: 124 mg/dL — ABNORMAL HIGH (ref 70–99)
Glucose-Capillary: 118 mg/dL — ABNORMAL HIGH (ref 70–99)

## 2021-07-19 MED ORDER — MAGIC MOUTHWASH
15.0000 mL | Freq: Four times a day (QID) | ORAL | Status: AC
Start: 2021-07-19 — End: 2021-07-20
  Administered 2021-07-19 (×3): 15 mL via ORAL
  Filled 2021-07-19 (×4): qty 15

## 2021-07-19 MED ORDER — K PHOS MONO-SOD PHOS DI & MONO 155-852-130 MG PO TABS
500.0000 mg | ORAL_TABLET | Freq: Two times a day (BID) | ORAL | Status: DC
  Administered 2021-07-19 – 2021-07-20 (×2): 500 mg via ORAL
  Filled 2021-07-19 (×3): qty 2

## 2021-07-19 MED ORDER — PANTOPRAZOLE SODIUM 40 MG PO TBEC
40.0000 mg | DELAYED_RELEASE_TABLET | Freq: Two times a day (BID) | ORAL | Status: DC
  Administered 2021-07-19 – 2021-07-20 (×3): 40 mg via ORAL
  Filled 2021-07-19 (×2): qty 1

## 2021-07-19 MED ORDER — FENTANYL CITRATE PF 50 MCG/ML IJ SOSY: 12.5000 ug | PREFILLED_SYRINGE | INTRAMUSCULAR | Status: DC | PRN

## 2021-07-19 MED ORDER — ACETAMINOPHEN 160 MG/5ML PO SOLN
650.0000 mg | Freq: Four times a day (QID) | ORAL | Status: DC
  Administered 2021-07-19 – 2021-07-20 (×4): 650 mg
  Filled 2021-07-19 (×4): qty 20.3

## 2021-07-19 MED ORDER — ACETAMINOPHEN 325 MG PO TABS: 650.0000 mg | ORAL_TABLET | Freq: Four times a day (QID) | ORAL | Status: DC | PRN

## 2021-07-19 MED ORDER — MECLIZINE HCL 25 MG PO TABS
12.5000 mg | ORAL_TABLET | Freq: Three times a day (TID) | ORAL | Status: DC
  Administered 2021-07-19: 12.5 mg via ORAL
  Filled 2021-07-19: qty 1

## 2021-07-19 MED ORDER — CYANOCOBALAMIN 1000 MCG/ML IJ SOLN
1000.0000 ug | Freq: Once | INTRAMUSCULAR | Status: AC
  Administered 2021-07-19: 1000 ug via SUBCUTANEOUS
  Filled 2021-07-19: qty 1

## 2021-07-19 MED ORDER — PHENOL 1.4 % MT LIQD: 2.0000 | OROMUCOSAL | Status: DC | PRN

## 2021-07-19 MED ORDER — BUMETANIDE 1 MG PO TABS
2.0000 mg | ORAL_TABLET | Freq: Every day | ORAL | Status: DC
  Filled 2021-07-19: qty 2

## 2021-07-19 MED ORDER — BUMETANIDE 1 MG PO TABS
2.0000 mg | ORAL_TABLET | Freq: Every day | ORAL | Status: DC
  Administered 2021-07-20: 2 mg via ORAL
  Filled 2021-07-19 (×2): qty 2

## 2021-07-19 MED ORDER — FUROSEMIDE 40 MG PO TABS
40.0000 mg | ORAL_TABLET | Freq: Once | ORAL | Status: AC
  Administered 2021-07-19: 40 mg via ORAL
  Filled 2021-07-19: qty 1

## 2021-07-19 MED ORDER — MENTHOL 3 MG MT LOZG
1.0000 | LOZENGE | OROMUCOSAL | Status: DC | PRN
Start: 2021-07-19 — End: 2021-07-20

## 2021-07-19 NOTE — Progress Notes (Signed)
? ?Deborah Farley ?[id number] ?June 25, 1945 ? ?CARE TEAM: ? ?PCP: Abigail Miyamoto, MD ? ?Outpatient Care Team: Patient Care Team: ?Abigail Miyamoto, MD as PCP - General (Family Medicine) ?Revankar, Aundra Dubin, MD as PCP - Cardiology (Cardiology) ?Earvin Hansen, RPH (Inactive) as Pharmacist (Pharmacist) ?Karie Soda, MD as Consulting Physician (General Surgery) ?Meryl Dare, MD as Consulting Physician (Gastroenterology) ?Revankar, Aundra Dubin, MD as Consulting Physician (Cardiology) ?Buck Mam, LCSW as Child psychotherapist (Licensed Visual merchandiser) ? ?Inpatient Treatment Team: Treatment Team: Attending Provider: Karie Soda, MD; Rounding Team: Lilyan Gilford, MD; Occupational Therapist: Ardyth Harps, OT; Technician: Vella Raring, NT; Charge Nurse: Riki Sheer, RN; Utilization Review: Oval Linsey, RN ? ? ?Problem List:  ? ?Principal Problem: ?  Incarcerated hiatal hernia ?Active Problems: ?  Progressive angina (HCC) ?  Hyperlipidemia ?  Hypertension ?  Hypothyroidism ?  Obesity (BMI 35.0-39.9 without comorbidity) ?  Acid reflux ?  Rheumatoid arthritis (HCC) ?  History of diabetes mellitus ?  Chronic systolic congestive heart failure (HCC) ?  Gait disturbance ?  Memory difficulty ?  Degeneration of lumbar intervertebral disc ?  Hypoxia ?  Acute metabolic encephalopathy ?  Transaminitis ?  Mild protein-calorie malnutrition (HCC) ?  Thrombocytopenia (HCC) ? ? ?4 Days Post-Op  07/15/2021 ? ?POST-OPERATIVE DIAGNOSIS:  INCARCERATED PARAESOPHAGEAL HIATAL HERNIA ?  ?PROCEDURE:   ?1. ROBOTIC reduction of paraesophageal hiatal hernia ?2. Type II mediastinal dissection. ?3.  Left diaphragm release ?4. Primary repair of hiatal hernia over pledgets.  ?5. Anterior & posterior gastropexy. ?6. Toupet (partial 270deg posterior) fundoplication x 4 cm ?7. Mesh reinforcement with absorbable mesh ?  ?SURGEON:  Ardeth Sportsman, MD ? ?OR FINDINGS:  ?  ?Moderate-sized paraesophageal hiatal hernia with  mesenteric axial volvulus without obstruction or gangrene involving  ?100% of the stomach in the mediastinum.  There was a 9 x 8 cm hiatal defect. ?  ?Left sagittal diaphragmatic release made 5 cm from the left crus.  It is a primary repair over pledgets. Mesh reinforcement was used with Mesh was used: Phasix? Mesh (a knitted monofilament mesh scaffold using Poly-4-hydroxybutyrate (P4HB), a biologically derived, fully resorbable material) ?  ?The patient has a 4 cm Toupet fundoplication -posterior partial fundoplication done given her advanced age and obesity despite manometry showing no major dysmotility.  The patient has had anterior and posterior gastropexy. ? ?Assessment ? ?Slowly improving ? ?(Hospital Stay = 4 days) ? ?Plan: ? ?Status improved with medication adjustments.  TRH internal medicine help appreciated.  Scheduled nonnarcotic pain medications.  CT scan of head negative yesterday.  Follow mental status. ? ?Diuresis.  See if can ambulate without help of oxygen.  Make her Bumex scheduled instead of as needed ? ?Palliate sore throat.  Most likely related to endotracheal intubation along with chronic heartburn.  Continue Magic mouthwash.  Can have Cepacol lozenges and Chloraseptic spray as needed.  She seems less hoarse today and speaking better. ? ?Dysphagia 1 diet next few weeks. ? ?Physical & Occupational Therapy to reevaluate.  Trying to sort out if she needs to go to skilled facility versus home health for some PT/OT. ? ?Keep drain for now.  Most likely will remove drain prior to discharge since not high-volume. ? ?Adjust pain control regimen.  Seemed to be sedated with Dilaudid.  Switch to fentanyl.  Lowest dose with every 4 hour interval.  Not high usage yesterday.  Stay on acetaminophen.  Stopped as needed methocarbamol and gabapentin.  Tramadol as  preferred narcotic. ? ?Hypertension control. ? ?Hypothyroidism control. ? ?-VTE prophylaxis- SCDs, etc ? ?-mobilize as tolerated to help  recovery ? ?Disposition:  ?Disposition:  ?The patient is from: Home ? ?Anticipate discharge to:  Home with Home Health ? ?Anticipated Date of Discharge is:  March 15,2023 ?  ? ?Barriers to discharge:  Pending Clinical improvement (more likely than not) ? ?Patient currently is NOT MEDICALLY STABLE for discharge from the hospital from a surgery standpoint. ? ? ? ? ? ?I reviewed nursing notes, last 24 h vitals and pain scores, last 48 h intake and output, last 24 h labs and trends, and last 24 h imaging results. I have reviewed this patient's available data, including medical history, events of note, test results, etc as part of my evaluation.  A significant portion of that time was spent in counseling.  Care during the described time interval was provided by me. ? ?This care required moderate level of medical decision making.  07/19/2021 ? ? ? ?Subjective: ?(Chief complaint) ? ?Nursing and therapy concern for patient's confusion yesterday. ? ?CT scan of head ordered stat negative for stroke. ? ?I asked internal medicine consultation.  They backed off on some of her home depression/anxiety medications.  Backed off on other medications well. ? ?No issues overnight.  Patient denies much pain.  Main complaint still is her sore throat.  She says she is speaking brother and talking better but where she still uncomfortable. ? ? ? ?Objective: ? ?Vital signs: ? ?Vitals:  ? 07/18/21 0536 07/18/21 1354 07/18/21 2201 07/19/21 0536  ?BP: 128/64 121/62 127/71 (!) 159/72  ?Pulse: 71 66 70 74  ?Resp: ?Temp: 98.7 ?F (37.1 ?C) 98.4 ?F (36.9 ?C) 98.8 ?F (37.1 ?C) 98.9 ?F (37.2 ?C)  ?TempSrc: Oral Oral Oral Oral  ?SpO2: 91% 91% 91% 95%  ?Weight:      ?Height:      ? ? ?Last BM Date : 07/14/21 ? ?Intake/Output  ? ?Yesterday: ? 03/13 0701 - 03/14 0700 ?In: 840 [P.O.:840] ?Out: 2275 [Urine:2200; Drains:75] ?This shift: ? No intake/output data recorded. ? ?Bowel function: ? Flatus: YES ? BM:  YES ? Drain:  Serosanguinous ? ? ?Physical Exam: ? ?General: Pt awake/alert in no acute distress ?Eyes: PERRL, normal EOM.  Sclera clear.  No icterus ?Neuro: CN II-XII intact w/o focal sensory/motor deficits. ?Lymph: No head/neck/groin lymphadenopathy ?Psych:  No delerium/psychosis/paranoia.  Oriented x 4 ?HENT: Normocephalic, Mucus membranes moist.  No thrush.  No hoarseness.  Good vocal strength. ?Neck: Supple, No tracheal deviation.  No obvious thyromegaly ?Chest: No pain to chest wall compression.  Good respiratory excursion.  No audible wheezing ?CV:  Pulses intact.  Regular rhythm.  No major extremity edema ?MS: Normal AROM mjr joints.  No obvious deformity ? ?Abdomen: Soft.  Nondistended.  Mildly tender at incisions only.  No evidence of peritonitis.  No incarcerated hernias. ? ?Ext:   No deformity.  No mjr edema.  No cyanosis ?Skin: No petechiae / purpurea.  No major sores.  Warm and dry ? ? ? ?Results:  ? ?Cultures: ?Recent Results (from the past 720 hour(s))  ?SARS CORONAVIRUS 2 (TAT 6-24 HRS)     Status: None  ? Collection Time: 07/12/21 11:37 AM  ?Result Value Ref Range Status  ? SARS Coronavirus 2 NEGATIVE NEGATIVE Final  ?  Comment: (NOTE) ?SARS-CoV-2 target nucleic acids are NOT DETECTED. ? ?The SARS-CoV-2 RNA is generally detectable in upper and lower ?respiratory specimens during the acute  phase of infection. Negative ?results do not preclude SARS-CoV-2 infection, do not rule out ?co-infections with other pathogens, and should not be used as the ?sole basis for treatment or other patient management decisions. ?Negative results must be combined with clinical observations, ?patient history, and epidemiological information. The expected ?result is Negative. ? ?Fact Sheet for Patients: ?HairSlick.no ? ?Fact Sheet for Healthcare Providers: ?quierodirigir.com ? ?This test is not yet approved or cleared by the [place] FDA and  ?has been authorized for detection  and/or diagnosis of SARS-CoV-2 by ?FDA under an Emergency Use Authorization (EUA). This EUA will remain  ?in effect (meaning this test can be used) for the duration of the ?COVID-19 declaration under Se ction 564(b)(1) of the Act, 21 [place]

## 2021-07-19 NOTE — Evaluation (Signed)
Occupational Therapy Evaluation ?Patient Details ?Name: Deborah Farley ?MRN: [id number] ?DOB: August 02, 1945 ?Today's Date: 07/19/2021 ? ? ?History of Present Illness Patient is 76 y.o. female s/p hiatal hernia repair on 07/15/21 with PMH significant for anxiety, DM, RA, GERD, glaucoma, HTN, HLD, thyroid disease, CAD, obesity, PTSD.  ? ?Clinical Impression ?  ?Patient is a 76 year old female who was noted to have had a functional decline in the ability to participate in ADLs. Patient reported living at home with her husband prior level and being independent in ADLs. No family in room at this time. Patient was noted to have increased confusion, visual hallucinations, decreased safety awareness, decreased activity tolerance, and decreased cardiopulomary tolerance. Patient would be most successful in next level of care with 24/7 caregiver support. Patient would continue to benefit from skilled OT services at this time while admitted and after d/c to address noted deficits in order to improve overall safety and independence in ADLs.  ?  ?   ? ?Recommendations for follow up therapy are one component of a multi-disciplinary discharge planning process, led by the attending physician.  Recommendations may be updated based on patient status, additional functional criteria and insurance authorization.  ? ?Follow Up Recommendations ? Skilled nursing-short term rehab (<3 hours/day) (if family is unable to offer 24/7 caregiver support at home)  ?  ?Assistance Recommended at Discharge Frequent or constant Supervision/Assistance  ?Patient can return home with the following A little help with walking and/or transfers;A little help with bathing/dressing/bathroom;Assistance with cooking/housework;Direct supervision/assist for financial management;Assist for transportation;Help with stairs or ramp for entrance;Direct supervision/assist for medications management ? ?  ?Functional Status Assessment ? Patient has had a recent decline in their  functional status and demonstrates the ability to make significant improvements in function in a reasonable and predictable amount of time.  ?Equipment Recommendations ? Other (comment) (defer to next venue)  ?  ?Recommendations for Other Services   ? ? ?  ?Precautions / Restrictions Precautions ?Precautions: Fall ?Precaution Comments: Rt UQ JP drain ?Restrictions ?Weight Bearing Restrictions: No  ? ?  ? ?Mobility Bed Mobility ?Overal bed mobility: Needs Assistance ?Bed Mobility: Supine to Sit ?  ?  ?Supine to sit: Min assist ?  ?  ?General bed mobility comments: with education on log rolling. patient then attempetd to long sit in bed x2 with continued cues to roll to reduce pressure on belly. ?  ? ?Transfers ?  ?  ?  ?  ?  ?  ?  ?  ?  ?  ?  ? ?  ?Balance Overall balance assessment: Needs assistance ?Sitting-balance support: Feet supported ?Sitting balance-Leahy Scale: Fair ?  ?  ?Standing balance support: During functional activity, Reliant on assistive device for balance, Bilateral upper extremity supported ?Standing balance-Leahy Scale: Poor ?  ?  ?  ?  ?  ?  ?  ?  ?  ?  ?  ?  ?   ? ?ADL either performed or assessed with clinical judgement  ? ?ADL Overall ADL's : Needs assistance/impaired ?Eating/Feeding: Sitting;Modified independent ?  ?Grooming: Therapist, nutritionalWash/dry face;Wash/dry hands;Standing;Min guard ?  ?Upper Body Bathing: Set up;Sitting;Supervision/ safety ?  ?Lower Body Bathing: Set up;Supervison/ safety;Sit to/from stand;Sitting/lateral leans ?  ?Upper Body Dressing : Set up;Sitting ?  ?Lower Body Dressing: Set up;Sitting/lateral leans;Min guard ?Lower Body Dressing Details (indicate cue type and reason): patient was able to adjust socks in seated position on edge of bed bringing ankle to knee. patient was min guard for standing with  poor safety awareness and needed additional cues to avoid pulling drain. ?Toilet Transfer: Solicitor;Ambulation;Rolling walker (2 wheels) ?Toilet Transfer Details  (indicate cue type and reason): with cues to keep walker close to her and with her when standing to wash hands ?Toileting- Architect and Hygiene: Min guard;Sit to/from stand ?  ?  ?  ?Functional mobility during ADLs: Min guard;Rolling walker (2 wheels) ?General ADL Comments: patient was able to maintain O2 saturation during functional mobility in room and hallway above 92% on RA.  ? ? ? ?Vision Baseline Vision/History: 1 Wears glasses ?Patient Visual Report: No change from baseline ?   ?   ?Perception   ?  ?Praxis   ?  ? ?Pertinent Vitals/Pain Pain Assessment ?Pain Assessment: Faces ?Pain Score: 5  ?Faces Pain Scale: Hurts a little bit ?Pain Location: abdomen ?Pain Descriptors / Indicators: Discomfort ?Pain Intervention(s): Limited activity within patient's tolerance, Monitored during session, Repositioned  ? ? ? ?Hand Dominance Right ?  ?Extremity/Trunk Assessment Upper Extremity Assessment ?Upper Extremity Assessment: Overall WFL for tasks assessed ?  ?Lower Extremity Assessment ?Lower Extremity Assessment: Defer to PT evaluation ?  ?Cervical / Trunk Assessment ?Cervical / Trunk Assessment: Normal ?  ?Communication Communication ?Communication: No difficulties ?  ?Cognition Arousal/Alertness: Awake/alert ?Behavior During Therapy: [facility] for tasks assessed/performed ?Overall Cognitive Status: Impaired/Different from baseline ?  ?  ?  ?  ?  ?  ?  ?  ?  ?Orientation Level: Person, Time (situation- because i have pain) ?  ?Memory: Decreased short-term memory ?  ?  ?  ?  ?General Comments: patient reported exact location as being hospital. patient was able to name day of week and date with no further prompts on this date. patient is impulsive with movements and continues to demonstrate poor safety awareness. ?  ?  ?General Comments    ? ?  ?Exercises   ?  ?Shoulder Instructions    ? ? ?Home Living Family/patient expects to be discharged to:: Private residence ?Living Arrangements: Spouse/significant  other ?Available Help at Discharge: Family ?Type of Home: House ?Home Access: Stairs to enter ?Entrance Stairs-Number of Steps: 5 ?Entrance Stairs-Rails: Can reach both ?Home Layout: Two level;Able to live on main level with bedroom/bathroom;1/2 bath on main level;Bed/bath upstairs ?Alternate Level Stairs-Number of Steps: 4 ?Alternate Level Stairs-Rails: Can reach both ?Bathroom Shower/Tub: Tub/shower unit ?  ?  ?Bathroom Accessibility: Yes ?  ?Home Equipment: Agricultural consultant (2 wheels) ?  ?  ?  ? ?  ?Prior Functioning/Environment Prior Level of Function : Independent/Modified Independent;Driving ?  ?  ?  ?  ?  ?  ?Mobility Comments: does not use AD for mobility ?ADLs Comments: I ADLs and IADLs. ?  ? ?  ?  ?OT Problem List: Decreased activity tolerance;Decreased cognition;Decreased safety awareness;Impaired balance (sitting and/or standing);Pain ?  ?   ?OT Treatment/Interventions: Self-care/ADL training;Therapeutic exercise;Neuromuscular education;Energy conservation;DME and/or AE instruction;Therapeutic activities;Balance training;Patient/family education  ?  ?OT Goals(Current goals can be found in the care plan section) Acute Rehab OT Goals ?Patient Stated Goal: to go to the bathroom ?OT Goal Formulation: With patient ?Time For Goal Achievement: 08/02/21 ?Potential to Achieve Goals: Good  ?OT Frequency: Min 2X/week ?  ? ?Co-evaluation   ?  ?  ?  ?  ? ?  ?AM-PAC OT "6 Clicks" Daily Activity     ?Outcome Measure Help from another person eating meals?: None ?Help from another person taking care of personal grooming?: A Little ?Help from another person toileting, which  includes using toliet, bedpan, or urinal?: A Little ?Help from another person bathing (including washing, rinsing, drying)?: A Little ?Help from another person to put on and taking off regular upper body clothing?: A Little ?Help from another person to put on and taking off regular lower body clothing?: A Little ?6 Click Score: 19 ?  ?End of Session  Equipment Utilized During Treatment: Rolling walker (2 wheels) ?Nurse Communication: Mobility status;Other (comment) (O2 during session and seeing "bugs" on floor and walls) ? ?Activity Tolerance: Patient tolerated treatme

## 2021-07-19 NOTE — Assessment & Plan Note (Addendum)
Continues to have no bowel movement. Post-operative. Management per surgery. ?

## 2021-07-19 NOTE — Progress Notes (Signed)
[facility] Consultation Progress Note ? ?Patient: Deborah Farley SNK:[id number]   ?PCP: Abigail Miyamoto, MD DOB: 1946-01-17   ?DOA: 07/15/2021   DOS: 07/19/2021   ?Date of Service: the patient was seen and examined on 07/19/2021 ?Primary service: Karie Soda, MD  ? ? ?Brief hospital course: ?Patient with past medical history of CAD, type II DM, depression, HLD, hypothyroidism, HTN, obesity, rheumatoid arthritis on methotrexate. ?Presented with ongoing issues with hiatal hernia and underwent a robotic reduction of paraesophageal hiatal hernia on 07/15/2021. ?TRH was consulted for confusion and altered mental status.  ? ?Assessment and Plan: ?Acute metabolic encephalopathy ?Suspect this is mostly polypharmacy and other than metabolic encephalopathy. ?Patient hospitalized, underwent surgery on 3/10 and started to have increased confusion on 3/13.  At which time hospitalist was consulted. ?At the time of my evaluation patient is alert awake and oriented x3.  Does not have any focal deficit.   ?CT head was unremarkable for any acute abnormality. ?Metabolic work-up including ammonia level, TSH normal.   ?B12 relatively low.  Will replace with subcutaneous injection x1. ?No evidence of infection. ?Patient is on multiple psychotropic medication including Wellbutrin, Abilify, gabapentin, Zoloft, trazodone. ?Postoperatively also has pain requiring pain medications. ?At present given improvement in mentation no further work-up necessary. ?Continue to hold trazodone gabapentin. ? ?Constipation ?Postop. ?Management per surgery. ? ?Hypokalemia ?Continue replacement. ? ?Pleural effusion on left ?Acute hypoxic respiratory failure. ?Left-sided chest pain. ?Possible atelectasis ?Patient reported that she has severe left-sided chest pain which worsens with movement.  She feels a bump on her left side. ?On further examination, patient has some bruising and mild hematoma at the surgical site where intercostal space was  accessed. ?Discussed with surgery, management of the wound and pain per surgery. ?Likely resultant atelectasis as well as pleural effusion seen on chest x-ray on 3/14. ?Agree with using Bumex. ?Add incentive spirometry. ?Agree with continuing scheduled Tylenol for pain control. ? ? ?Transaminitis ?Postop.  Likely vascular congestion. ?Anticipate improvement with diuresis. ?Monitor. ? ?Chronic systolic congestive heart failure (HCC) ?On Bumex.  With continue daily scheduled doses. ? ?Hypothyroidism ?TSH normal.  Continue Synthroid.. ? ?We will continue to follow the patient.   ?Subjective: No nausea no vomiting.  No fever no chills.  Reports left-sided chest pain worsening with movement.  Passing gas but no BM.  Minimal oral intake.  Reports swimmy head feeling. ? ?Objective: ?General: Appear in mild distress, left chest surgical incision with small hematoma, no other rash; Oral Mucosa Clear, moist. no Abnormal Neck Mass Or lumps, Conjunctiva normal  ?Cardiovascular: S1 and S2 Present, no Murmur, ?Respiratory: good respiratory effort, Bilateral Air entry present and CTA, no Crackles, no wheezes ?Abdomen: Bowel Sound present, Soft and no tenderness ?Extremities: no Pedal edema ?Neurology: alert and oriented to time, place, and person ?affect appropriate. no new focal deficit ?Gait not checked due to patient safety concerns  ? ?Family Communication: no family was present at bedside, at the time of interview.  ? ?Data Reviewed: ?I have Reviewed nursing notes, Vitals, and Lab results since pt's last encounter. Pertinent lab results CBC and BMP ?I have ordered test including CBC and BMP ?I have ordered imaging studies chest x-ray. ?I have discussed pt's care plan and test results with general surgery.  ? ?Author: ?Lynden Oxford, MD ?07/19/2021 5:04 PM ? ?To reach On-call, see care teams to locate the attending and reach out to them via [email or web address]. ?If 7PM-7AM, please contact night-coverage ?If you still have difficulty  reaching the attending provider,  please page the [facility] (Director on Call) for [facility] on amion for assistance.  ?

## 2021-07-19 NOTE — Assessment & Plan Note (Signed)
Postop.  Likely vascular congestion. ?Anticipate improvement with diuresis. ?Monitor. ?

## 2021-07-19 NOTE — Assessment & Plan Note (Addendum)
-  Continue Synthroid 25 mcg daily 

## 2021-07-19 NOTE — TOC Initial Note (Signed)
Transition of Care (TOC) - Initial/Assessment Note  ? ? ?Patient Details  ?Name: Deborah Farley ?MRN: [id number] ?Date of Birth: 1945/06/06 ? ?Transition of Care (TOC) CM/SW Contact:    ?Suhayla Chisom, LCSW ?Phone Number: ?07/19/2021, 1:46 PM ? ?Clinical Narrative:                 ?Met with pt and spouse today to introduce self/ TOC role and discuss anticipated dc needs.  Pt A&O x 4 and reports feeling she is "much better" today and spouse agrees.  Per RN, pt still with some confusion earlier today.  Pt and spouse feel this is due to medications.   ?Pt anticipates dc home with spouse who can provide 24/7 support as well as daughter living next door.  Pt aware that HHPT has been recommended but not yet agreeable - states she will consider and let me know tomorrow.  Continue to follow. ? ?Expected Discharge Plan: [place] ?Barriers to Discharge: Continued Medical Work up ? ? ?Patient Goals and CMS Choice ?Patient states their goals for this hospitalization and ongoing recovery are:: return home ?  ?  ? ?Expected Discharge Plan and Services ?Expected Discharge Plan: [place] ?In-house Referral: Clinical Social Work ?  ?  ?Living arrangements for the past 2 months: [place] ?                ?  ?  ?  ?  ?  ?  ?  ?  ?  ?  ? ?Prior Living Arrangements/Services ?Living arrangements for the past 2 months: [place] ?Lives with:: Spouse ?Patient language and need for interpreter reviewed:: Yes ?Do you feel safe going back to the place where you live?: Yes      ?Need for Family Participation in Patient Care: Yes (Comment) ?Care giver support system in place?: Yes (comment) ?  ?Criminal Activity/Legal Involvement Pertinent to Current Situation/Hospitalization: No - Comment as needed ? ?Activities of Daily Living ?Home Assistive Devices/Equipment: None ?ADL Screening (condition at time of admission) ?Patient's cognitive ability adequate to safely complete daily activities?:  Yes ?Is the patient deaf or have difficulty hearing?: No ?Does the patient have difficulty seeing, even when wearing glasses/contacts?: No ?Does the patient have difficulty concentrating, remembering, or making decisions?: No ?Patient able to express need for assistance with ADLs?: Yes ?Does the patient have difficulty dressing or bathing?: No ?Independently performs ADLs?: Yes (appropriate for developmental age) ?Does the patient have difficulty walking or climbing stairs?: Yes ?Weakness of Legs: Both ?Weakness of Arms/Hands: None ? ?Permission Sought/Granted ?Permission sought to share information with : Family Supports ?Permission granted to share information with : Yes, Verbal Permission Granted ? Share Information with NAME: Austen Wygant ?   ? Permission granted to share info w Relationship: spouse ? Permission granted to share info w Contact Information: [phone] ? ?Emotional Assessment ?Appearance:: Appears stated age ?Attitude/Demeanor/Rapport: Gracious ?Affect (typically observed): Accepting ?Orientation: : Oriented to Self, Oriented to Place, Oriented to  Time, Oriented to Situation ?Alcohol / Substance Use: Not Applicable ?Psych Involvement: No (comment) ? ?Admission diagnosis:  Incarcerated hiatal hernia [K44.0] ?Patient Active Problem List  ? Diagnosis Date Noted  ? Hypoxia 07/18/2021  ? Acute metabolic encephalopathy 35/36/1443  ? Transaminitis 07/18/2021  ? Mild protein-calorie malnutrition ([place]) 07/18/2021  ? Thrombocytopenia ([place]) 07/18/2021  ? Incarcerated hiatal hernia 07/15/2021  ? Degeneration of lumbar intervertebral disc 03/02/2021  ? Depression 02/17/2021  ? Chronic pain syndrome 11/30/2020  ?  PTSD (post-traumatic stress disorder) 11/30/2020  ? Depression, major, single episode, moderate ([place]) 08/31/2020  ? Obesity, diabetes, and hypertension syndrome ([place]) 07/29/2020  ? Memory difficulty   ? Spastic esophagus   ? Acute sinusitis 03/19/2020  ? Suspected pulmonary embolism 03/11/2019  ?  Dyspnea on exertion 01/16/2019  ? Gait disturbance 03/15/2017  ? 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 History of diabetes mellitus 05/31/2016  ? Rheumatoid arthritis ([place]) 04/12/2016  ? High risk medication use 04/12/2016  ? Heart disease 03/01/2016  ? Hyperlipidemia 02/29/2016  ? Hypertension 02/29/2016  ? Hypothyroidism 02/29/2016  ? Obesity (BMI 35.0-39.9 without comorbidity) 02/29/2016  ? Acid reflux 02/29/2016  ? Progressive angina ([place]) 08/11/2015  ? Diet-controlled diabetes mellitus ([place]) 08/11/2015  ? Chronic idiopathic constipation 07/02/2015  ? Chronic systolic congestive heart failure ([place]) 07/02/2015  ? Hiatal hernia 07/02/2015  ? Malaise and fatigue 07/02/2015  ? Major depressive disorder, recurrent episode, moderate ([place]) 07/02/2015  ? Coronary artery disease involving native coronary artery of native heart with unstable angina pectoris ([place]) 07/02/2015  ? Epigastric pain 04/08/2015  ? ?PCP:  Lillard Anes, MD ?Pharmacy:   ?[facility] [place], [place] - [address] ?[address] ?[place] [address] ?Phone: [phone] Fax: [phone] ? ?[facility] [place], [place] ?[place] ?[address] ?[place] [address] ?Phone: [phone] Fax: [phone] ? ? ? ? ?Social Determinants of Health (SDOH) Interventions ?  ? ?Readmission Risk Interventions ?Readmission Risk Prevention Plan 07/19/2021  ?Transportation Screening Complete  ?PCP or Specialist Appt within 5-7 Days Complete  ?Home Care Screening Complete  ?Some recent data might be hidden  ? ? ? ?

## 2021-07-19 NOTE — Care Management Important Message (Signed)
Important Message ? ?Patient Details IM Letter placed in Patients room. ?Name: Deborah Farley ?MRN: [id number] ?Date of Birth: [birth year] ? ? ?Medicare Important Message Given:  Yes ? ? ? ? ?Caren Macadam ?07/19/2021, 1:52 PM ?

## 2021-07-19 NOTE — Assessment & Plan Note (Addendum)
Resolved with supplementation 

## 2021-07-19 NOTE — Progress Notes (Signed)
Speech Language Pathology Treatment: Dysphagia  ?Patient Details ?Name: Deborah Farley ?MRN: [id number] ?DOB: 1946-02-20 ?Today's Date: 07/19/2021 ?Time: 1245-1310 ?SLP Time Calculation (min) (ACUTE ONLY): 25 min ? ?Assessment / Plan / Recommendation ?Clinical Impression ? Patient seen by SLP for skilled session focused on dysphagia. SLP acknowledged new order for bedside/clinical swallow evaluation and spoke with RN who did not report any changes in patient's swallow function and no new concerns. Patient was awake and alert, sitting in recliner. She appeared much more calm and lucid today as compared to previous date. Her lunch tray was in front of her but aside from some of the tea, rest of PO's were untouched. She reported she is a picky eater even about "brands of foods" and she does not like the pureed texture foods. SLP showed mighty shake (vanilla flavor) to patient but she scrunched up her nose and said "I dont like those kinds of things". SLP retrieved a Boost Breeze mixed berry supplement drink and patient took small sips, saying, "I can sip on this", indicating she liked it well enough. Patient's spouse entered room and had brough patient a chocolate milkshake and oatmeal. He asked SLP if that was appropriate consistency and SLP verified that it was. SLP educated and provided patient and spouse with information about pureed foods diet and recommended that patient look this over and determine what types of pureed foods she would eat as she has not been eating much of any PO's other than milkshakes her family brings her. SLP will continue to follow patient for diet toleration.  ? ?  ?HPI HPI: Patient is 76 y.o. 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 She was started on Dys 1 (puree) solids, thin liquids by  surgery but SLP ordered to assess for potential oropharyngeal phase of dysphagia. PMH significant for anxiety, DM, RA, GERD, glaucoma, HTN, HLD, thyroid disease, CAD, obesity, PTSD. Patient having confusion and question hallucinations on 3/13 and so Neurontin was stopped. ?  ?   ?SLP Plan ? Continue with current plan of care ? ?  ?  ?Recommendations for follow up therapy are one component of a multi-disciplinary discharge planning process, led by the attending physician.  Recommendations may be updated based on patient status, additional functional criteria and insurance authorization. ?  ? ?Recommendations  ?Diet recommendations: Dysphagia 1 (puree);Thin liquid ?Liquids provided via: Cup;No straw ?Medication Administration: Whole meds with puree ?Supervision: Patient able to self feed ?Compensations: Slow rate;Small sips/bites;Minimize environmental distractions ?Postural Changes and/or Swallow Maneuvers: Seated upright 90 degrees;Upright 30-60 min after meal  ?   ?    ?   ? ? ? ? Oral Care Recommendations: Oral care BID ?Follow Up Recommendations: No SLP follow up ?Assistance recommended at discharge: None ?SLP Visit Diagnosis: Dysphagia, unspecified (R13.10) ?Plan: Continue with current plan of care ? ? ? ? ?  ?  ? ? ?Sonia Baller, MA, CCC-SLP ?Speech Therapy ? ?

## 2021-07-19 NOTE — Hospital Course (Signed)
Patient with past medical history of CAD, type II DM, depression, HLD, hypothyroidism, HTN, obesity, rheumatoid arthritis on methotrexate. ?Presented with ongoing issues with hiatal hernia and underwent a robotic reduction of paraesophageal hiatal hernia on 07/15/2021. ?TRH was consulted for confusion and altered mental status. ?

## 2021-07-19 NOTE — Assessment & Plan Note (Addendum)
Concern this was possibly secondary to polypharmacy (toxic) vs metabolic encephalopathy. Patient's confusion resolved. CT imaging unremarkable. ?

## 2021-07-19 NOTE — Assessment & Plan Note (Addendum)
Associated acute respiratory failure with hypoxia and left sided chest pain. Imaging. Patient started on Bumex. Moderate amount of fluid on chest x-ray (3/14). ?-Ambulatory pulse ox ?-If continues to have hypoxia, would recommend left thoracentesis prior to discharge with labs. Otherwise, outpatient follow-up. ?

## 2021-07-19 NOTE — Assessment & Plan Note (Signed)
>>  ASSESSMENT AND PLAN FOR CONSTIPATION WRITTEN ON 07/20/2021 10:25 AM BY NETTEY, RALPH A, MD  Continues to have no bowel movement. Post-operative. Management per surgery.

## 2021-07-19 NOTE — Assessment & Plan Note (Addendum)
On Bumex while inpatient.  With continue daily scheduled doses. ?

## 2021-07-20 DIAGNOSIS — K440 Diaphragmatic hernia with obstruction, without gangrene: Secondary | ICD-10-CM | POA: Diagnosis not present

## 2021-07-20 LAB — BASIC METABOLIC PANEL
Anion gap: 7 (ref 5–15)
BUN: 12 mg/dL (ref 8–23)
CO2: 27 mmol/L (ref 22–32)
Calcium: 8.5 mg/dL — ABNORMAL LOW (ref 8.9–10.3)
Chloride: 103 mmol/L (ref 98–111)
Creatinine, Ser: 0.83 mg/dL (ref 0.44–1.00)
GFR, Estimated: >60 mL/min (ref >60)
Glucose, Bld: 127 mg/dL — ABNORMAL HIGH (ref 70–99)
Potassium: 4.1 mmol/L (ref 3.5–5.1)
Sodium: 137 mmol/L (ref 135–145)

## 2021-07-20 LAB — GLUCOSE, CAPILLARY: Glucose-Capillary: 102 mg/dL — ABNORMAL HIGH (ref 70–99)

## 2021-07-20 LAB — MAGNESIUM: Magnesium: 2.2 mg/dL (ref 1.7–2.4)

## 2021-07-20 MED ORDER — PROMETHAZINE HCL 25 MG RE SUPP: 25.0000 mg | Freq: Three times a day (TID) | RECTAL | 5 refills | Status: DC | PRN

## 2021-07-20 MED ORDER — MECLIZINE HCL 25 MG PO TABS: 12.5000 mg | ORAL_TABLET | Freq: Three times a day (TID) | ORAL | Status: DC | PRN

## 2021-07-20 MED ORDER — SERTRALINE HCL 50 MG PO TABS: 50.0000 mg | ORAL_TABLET | Freq: Every day | ORAL | 2 refills | Status: DC

## 2021-07-20 MED ORDER — OMEPRAZOLE 20 MG PO CPDR: 20.0000 mg | DELAYED_RELEASE_CAPSULE | Freq: Every day | ORAL | 2 refills | Status: DC

## 2021-07-20 NOTE — Assessment & Plan Note (Signed)
Continue diltiazem. 

## 2021-07-20 NOTE — Assessment & Plan Note (Signed)
Likely secondary to left sided pleural effusion. On room air this morning at rest. ?-See problem, Pleural effusion on left ?

## 2021-07-20 NOTE — Progress Notes (Signed)
Occupational Therapy Treatment ?Patient Details ?Name: Deborah Farley ?MRN: [id number] ?DOB: 12/19/1945 ?Today's Date: 07/20/2021 ? ? ?History of present illness Patient is 76 y.o. female s/p hiatal hernia repair on 07/15/21 with PMH significant for anxiety, DM, RA, GERD, glaucoma, HTN, HLD, thyroid disease, CAD, obesity, PTSD. ?  ?OT comments ? Patient exhibits improved cognition today. Alert to self, situation and day off the week. Patient has no complaints of abdominal pain and able to don socks and underwear. Patient able to ambulate in room without a device and no overt loss of balance. Patient's POC updated - patient can return home with husband with supervision. Do no expect any OT needs at discharge. Patient educated on protecting abdominal incision by limiting heavy lifting/pushing/pulling. Patient verbalized understanding.  ? ?Recommendations for follow up therapy are one component of a multi-disciplinary discharge planning process, led by the attending physician.  Recommendations may be updated based on patient status, additional functional criteria and insurance authorization. ?   ?Follow Up Recommendations ? No OT follow up  ?  ?Assistance Recommended at Discharge Frequent or constant Supervision/Assistance  ?Patient can return home with the following ? Assistance with cooking/housework;Direct supervision/assist for financial management;Assist for transportation;Help with stairs or ramp for entrance;Direct supervision/assist for medications management ?  ?Equipment Recommendations ? None recommended by OT  ?  ?Recommendations for Other Services   ? ?  ?Precautions / Restrictions Precautions ?Precautions: Other (comment) ?Precaution Comments: Rt UQ incision ?Restrictions ?Weight Bearing Restrictions: No  ? ? ?  ? ?Mobility Bed Mobility ?Overal bed mobility: Modified Independent ?  ?  ?  ?  ?  ?  ?General bed mobility comments: increased time ?  ? ?Transfers ?Overall transfer level: Needs  assistance ?Equipment used: None ?  ?Sit to Stand: Supervision ?  ?  ?  ?  ?  ?General transfer comment: Supervision to ambulate in room without a device. No overt loss of balance ?  ?  ?Balance Overall balance assessment: Mild deficits observed, not formally tested ?  ?  ?  ?  ?  ?  ?  ?  ?  ?  ?  ?  ?  ?  ?  ?  ?  ?  ?   ? ?ADL either performed or assessed with clinical judgement  ? ?ADL Overall ADL's : Needs assistance/impaired ?  ?  ?  ?  ?  ?  ?  ?  ?  ?  ?Lower Body Dressing: Supervision/safety ?Lower Body Dressing Details (indicate cue type and reason): able to don socks, find underwear and don them ?  ?  ?  ?  ?  ?  ?  ?  ?  ? ?Extremity/Trunk Assessment Upper Extremity Assessment ?Upper Extremity Assessment: Overall WFL for tasks assessed ?  ?  ?  ?  ?  ? ?Vision   ?Vision Assessment?: No apparent visual deficits ?  ?   ?   ? ?Cognition Arousal/Alertness: Awake/alert ?Behavior During Therapy: Flat affect ?Overall Cognitive Status: Within Functional Limits for tasks assessed ?  ?  ?  ?  ?  ?  ?  ?  ?  ?  ?  ?  ?  ?  ?  ?  ?General Comments: Improved cognition and alert to self and day off the week and situation. ?  ?  ?   ?   ?   ?   ? ? ?Pertinent Vitals/ Pain       Pain Assessment ?Pain Assessment: No/denies pain ? ? ?  Frequency ? Min 2X/week  ? ? ? ? ?  ?Progress Toward Goals ? ?OT Goals(current goals can now be found in the care plan section) ? Progress towards OT goals: Progressing toward goals ? ?Acute Rehab OT Goals ?Patient Stated Goal: did not state ?OT Goal Formulation: With patient ?Time For Goal Achievement: 08/02/21 ?Potential to Achieve Goals: Good  ?Plan Discharge plan needs to be updated   ? ?Co-evaluation ? ? ?   ?  ?  ?  ?  ? ?  ?AM-PAC OT "6 Clicks" Daily Activity     ?Outcome Measure ? ? Help from another person eating meals?: None ?Help from another person taking care of personal grooming?: A Little ?Help from another person toileting, which includes using toliet, bedpan, or urinal?: A  Little ?Help from another person bathing (including washing, rinsing, drying)?: A Little ?Help from another person to put on and taking off regular upper body clothing?: A Little ?Help from another person to put on and taking off regular lower body clothing?: A Little ?6 Click Score: 19 ? ?  ?End of Session   ? ?OT Visit Diagnosis: Unsteadiness on feet (R26.81);Muscle weakness (generalized) (M62.81);Other symptoms and signs involving cognitive function ?  ?Activity Tolerance Patient tolerated treatment well ?  ?Patient Left in chair;with call bell/phone within reach;with chair alarm set ?  ?Nurse Communication  (okay for home) ?  ? ?   ? ?Time: [id number] ?OT Time Calculation (min): 10 min ? ?Charges: OT General Charges ?$OT Visit: 1 Visit ?OT Treatments ?$Self Care/Home Management : 8-22 mins ? ?Deborah Farley, OTR/L ?Acute Care Rehab Services  ?Office [phone] ?Pager: [phone]  ? ?Deborah Farley ?07/20/2021, 9:44 AM ?

## 2021-07-20 NOTE — Assessment & Plan Note (Signed)
Body mass index is 35.07 kg/m.

## 2021-07-20 NOTE — Progress Notes (Signed)
? ?Deborah Farley ?[id number] ?1945-12-21 ? ?CARE TEAM: ? ?PCP: Abigail Miyamoto, MD ? ?Outpatient Care Team: Patient Care Team: ?Abigail Miyamoto, MD as PCP - General (Family Medicine) ?Revankar, Aundra Dubin, MD as PCP - Cardiology (Cardiology) ?Earvin Hansen, RPH (Inactive) as Pharmacist (Pharmacist) ?Karie Soda, MD as Consulting Physician (General Surgery) ?Meryl Dare, MD as Consulting Physician (Gastroenterology) ?Revankar, Aundra Dubin, MD as Consulting Physician (Cardiology) ?Buck Mam, LCSW as Child psychotherapist (Licensed Visual merchandiser) ? ?Inpatient Treatment Team: Treatment Team: Attending Provider: Karie Soda, MD; Rounding Team: Lilyan Gilford, MD; Charge Nurse: Hilliard Clark, RN; Technician: Desma Mcgregor, NT; Occupational Therapist: Kelli Churn, OT; Charge Nurse: Edison Simon, RN; Pharmacist: Rollene Fare, [facility] ? ? ?Problem List:  ? ?Principal Problem: ?  Incarcerated hiatal hernia ?Active Problems: ?  Progressive angina (HCC) ?  Hyperlipidemia ?  Hypertension ?  Hypothyroidism ?  Obesity (BMI 35.0-39.9 without comorbidity) ?  Acid reflux ?  Rheumatoid arthritis (HCC) ?  History of diabetes mellitus ?  Chronic systolic congestive heart failure (HCC) ?  Gait disturbance ?  Memory difficulty ?  Degeneration of lumbar intervertebral disc ?  Hypoxia ?  Acute metabolic encephalopathy ?  Transaminitis ?  Mild protein-calorie malnutrition (HCC) ?  Thrombocytopenia (HCC) ?  Pleural effusion on left ?  Acute respiratory failure with hypoxia (HCC) ?  Left-sided chest pain ?  Hypokalemia ?  Constipation ? ? ?5 Days Post-Op  07/15/2021 ? ?POST-OPERATIVE DIAGNOSIS:  INCARCERATED PARAESOPHAGEAL HIATAL HERNIA ?  ?PROCEDURE:   ?1. ROBOTIC reduction of paraesophageal hiatal hernia ?2. Type II mediastinal dissection. ?3.  Left diaphragm release ?4. Primary repair of hiatal hernia over pledgets.  ?5. Anterior & posterior gastropexy. ?6. Toupet (partial 270deg posterior)  fundoplication x 4 cm ?7. Mesh reinforcement with absorbable mesh ?  ?SURGEON:  Ardeth Sportsman, MD ? ?OR FINDINGS:  ?  ?Moderate-sized paraesophageal hiatal hernia with mesenteric axial volvulus without obstruction or gangrene involving  ?100% of the stomach in the mediastinum.  There was a 9 x 8 cm hiatal defect. ?  ?Left sagittal diaphragmatic release made 5 cm from the left crus.  It is a primary repair over pledgets. Mesh reinforcement was used with Mesh was used: Phasix? Mesh (a knitted monofilament mesh scaffold using Poly-4-hydroxybutyrate (P4HB), a biologically derived, fully resorbable material) ?  ?The patient has a 4 cm Toupet fundoplication -posterior partial fundoplication done given her advanced age and obesity despite manometry showing no major dysmotility.  The patient has had anterior and posterior gastropexy. ? ?Assessment ? ?Slowly improving ? ?(Hospital Stay = 5 days) ? ?Plan: ? ?Status improved with medication adjustments.  TRH internal medicine help appreciated.  Scheduled nonnarcotic pain medications.  CT scan of head negative yesterday.  Zoloft cut in half and trazodone held.  Holding Xanax for now.  Mental status improved.  Follow mental status. ? ?Diuresis.  See if can ambulate without help of oxygen.  Make her Bumex scheduled instead of as needed ? ?Palliating sore throat.  Most likely related to endotracheal intubation along with chronic heartburn.  Continue Magic mouthwash.  Can have Cepacol lozenges and Chloraseptic spray as needed.  She seems even less hoarse today and speaking better. ? ?Dysphagia 1 diet next few weeks.  Speech therapy is work with the patient and her husband with diet education and instruction sheets as well. ? ?Physical & Occupational Therapy to reevaluate.  Trying to sort out if she needs to go to skilled  facility versus home health for some PT/OT. ? ?I think she dumped her left effusion yesterday through her drain and is doing better.  Okay to remove drain.    ? ?Better with adjusted pain control regimen.  Seemed to be sedated with Dilaudid.  Switch to fentanyl.  Lowest dose with every 4 hour interval.  Not high usage yesterday.  Stay on acetaminophen.  Stopped as needed methocarbamol and gabapentin.  Tramadol as preferred narcotic. ? ?Hypertension control. ? ?Hypothyroidism control. ? ?-VTE prophylaxis- SCDs, etc ? ?-mobilize as tolerated to help recovery ? ?Disposition: Safe to discharge from surgery standpoint.  Confusion/encephalopathy improved after medication adjustments disapproving any metabolic or cranial issues.  Patient prefers to go home with home health.  We will see what physical therapy nursing think that we will set up for probable discharge with home health later today. ?Disposition:  ?The patient is from: Home ? ?Anticipate discharge to:  Home with Home Health ? ?Anticipated Date of Discharge is:  March 15,2023 ?  ? ?Barriers to discharge:  Pending Clinical improvement (more likely than not) ? ?Patient currently is NOT MEDICALLY STABLE for discharge from the hospital from a surgery standpoint. ? ? ? ? ? ?I reviewed nursing notes, last 24 h vitals and pain scores, last 48 h intake and output, last 24 h labs and trends, and last 24 h imaging results. I have reviewed this patient's available data, including medical history, events of note, test results, etc as part of my evaluation.  A significant portion of that time was spent in counseling.  Care during the described time interval was provided by me. ? ?This care required moderate level of medical decision making.  07/20/2021 ? ? ? ?Subjective: ?(Chief complaint) ? ?Patient feeling much better at all. ? ?Notes a little bit of discomfort in her epigastric region with deep breaths but otherwise no major pain. ? ?Does not complain of sore throat today. ? ?Tolerating blenderized diet.  Less sticking of food. ? ? ? ?Objective: ? ?Vital signs: ? ?Vitals:  ? 07/18/21 2201 07/19/21 0536 07/19/21 1329 07/19/21  2050  ?BP: 127/71 (!) 159/72 126/81 (!) 151/72  ?Pulse: 70 74 67 74  ?Resp: 18 18 18 16   ?Temp: 98.8 ?F (37.1 ?C) 98.9 ?F (37.2 ?C) 98.2 ?F (36.8 ?C) 98.6 ?F (37 ?C)  ?TempSrc: Oral Oral Oral Oral  ?SpO2: 91% 95% 94% 92%  ?Weight:      ?Height:      ? ? ?Last BM Date : 07/14/21 ? ?Intake/Output  ? ?Yesterday: ? 03/14 0701 - 03/15 0700 ?In: 240 [P.O.:240] ?Out: 1515 [Urine:1325; Drains:190] ?This shift: ? No intake/output data recorded. ? ?Bowel function: ? Flatus: YES ? BM:  YES ? Drain: Serous ? ? ?Physical Exam: ? ?General: Pt awake/alert in no acute distress ?Eyes: PERRL, normal EOM.  Sclera clear.  No icterus ?Neuro: CN II-XII intact w/o focal sensory/motor deficits. ?Lymph: No head/neck/groin lymphadenopathy ?Psych:  No delerium/psychosis/paranoia.  Oriented x 4 ?HENT: Normocephalic, Mucus membranes moist.  No thrush.  No hoarseness.  Good vocal strength. ?Neck: Supple, No tracheal deviation.  No obvious thyromegaly ?Chest: No pain to chest wall compression.  Good respiratory excursion.  No audible wheezing ?CV:  Pulses intact.  Regular rhythm.  No major extremity edema ?MS: Normal AROM mjr joints.  No obvious deformity ? ?Abdomen: Soft.  Nondistended.  Mildly tender at incisions only.  No evidence of peritonitis.  No incarcerated hernias. ? ?Ext:   No deformity.  No mjr edema.  No cyanosis ?Skin: No petechiae / purpurea.  No major sores.  Warm and dry ? ? ? ?Results:  ? ?Cultures: ?Recent Results (from the past 720 hour(s))  ?SARS CORONAVIRUS 2 (TAT 6-24 HRS)     Status: None  ? Collection Time: 07/12/21 11:37 AM  ?Result Value Ref Range Status  ? SARS Coronavirus 2 NEGATIVE NEGATIVE Final  ?  Comment: (NOTE) ?SARS-CoV-2 target nucleic acids are NOT DETECTED. ? ?The SARS-CoV-2 RNA is generally detectable in upper and lower ?respiratory specimens during the acute phase of infection. Negative ?results do not preclude SARS-CoV-2 infection, do not rule out ?co-infections with other pathogens, and should not be  used as the ?sole basis for treatment or other patient management decisions. ?Negative results must be combined with clinical observations, ?patient history, and epidemiological information. The expected ?result is Negat

## 2021-07-20 NOTE — Plan of Care (Signed)
Instructions were reviewed with patient. All questions were answered. Patient was transported to main entrance by wheelchair. ° °

## 2021-07-20 NOTE — Discharge Summary (Signed)
Physician Discharge Summary  ? ? ?Patient ID: ?Deborah Farley ?MRN: [id number] ?DOB/AGE: 07/17/1945  ?76 y.o. ? ?Patient Care Team: ?Lillard Anes, MD as PCP - General (Family Medicine) ?Revankar, Reita Cliche, MD as PCP - Cardiology (Cardiology) ?Burnice Logan, [place] (Inactive) as Pharmacist (Pharmacist) ?Michael Boston, MD as Consulting Physician (General Surgery) ?Ladene Artist, MD as Consulting Physician (Gastroenterology) ?Revankar, Reita Cliche, MD as Consulting Physician (Cardiology) ?Deirdre Peer, LCSW as Education officer, museum (Licensed Holiday representative) ? ?Admit date: 07/15/2021 ? ?Discharge date: 07/20/2021 ? ?Hospital Stay = 5 days ? ? ? ?Discharge Diagnoses:  ?Principal Problem: ?  Incarcerated hiatal hernia ?Active Problems: ?  Progressive angina (HCC) ?  Hyperlipidemia ?  Hypertension ?  Hypothyroidism ?  Obesity (BMI 35.0-39.9 without comorbidity) ?  Acid reflux ?  Rheumatoid arthritis ([place]) ?  History of diabetes mellitus ?  Chronic systolic congestive heart failure ([place]) ?  Gait disturbance ?  Memory difficulty ?  Degeneration of lumbar intervertebral disc ?  Hypoxia ?  Acute metabolic encephalopathy ?  Transaminitis ?  Mild protein-calorie malnutrition ([place]) ?  Thrombocytopenia ([place]) ?  Pleural effusion on left ?  Acute respiratory failure with hypoxia ([place]) ?  Left-sided chest pain ?  Hypokalemia ?  Constipation ? ? ?5 Days Post-Op  07/15/2021 ?POST-OPERATIVE DIAGNOSIS:  INCARCERATED PARAESOPHAGEAL HIATAL HERNIA ?  ?PROCEDURE:   ?1. ROBOTIC reduction of paraesophageal hiatal hernia ?2. Type II mediastinal dissection. ?3.  Left diaphragm release ?4. Primary repair of hiatal hernia over pledgets.  ?5. Anterior & posterior gastropexy. ?6. Toupet (partial 270deg posterior) fundoplication x 4 cm ?7. Mesh reinforcement with absorbable mesh ?  ?SURGEON:  Adin Hector, MD ?  ?OR FINDINGS:  ?  ?Moderate-sized paraesophageal hiatal hernia with mesenteric axial volvulus without obstruction or gangrene  involving  ?100% of the stomach in the mediastinum.  There was a 9 x 8 cm hiatal defect. ?  ?Left sagittal diaphragmatic release made 5 cm from the left crus.  It is a primary repair over pledgets. Mesh reinforcement was used with Mesh was used: Phasix? Mesh (a knitted monofilament mesh scaffold using Poly-4-hydroxybutyrate (P4HB), a biologically derived, fully resorbable material) ?  ?The patient has a 4 cm Toupet fundoplication -posterior partial fundoplication done given her advanced age and obesity despite manometry showing no major dysmotility.  The patient has had anterior and posterior gastropexy. ? ?Consults: Physical Therapy, Occupational Therapy, Speech Therapy, Case Management / Social Work, and Internal Medicine (Hospitalist) ? ?Hospital Course:  ? ?The patient underwent the surgery above.  Patient had esophagram that showed no obstruction or leak.  Gradually advanced.  She did have some hypoxia with activity.  She was diuresed more aggressively and breathe better.  Postoperatively, the patient gradually mobilized and advanced to a pureed diet.  Pain and other symptoms were treated aggressively.  Given her advanced age I had therapy consultations nursing along with some therapist noted some increased or persistent confusion. ? ?Requested internal medicine consultation.  CT scan of the head disproved any stroke or any major issue.  No major metabolic abnormalities.  Mildly decreased vitamin B12 replaced.  Suspicion for polypharmacy confusion/encephalopathy.  Trazodone held Zoloft cut back and Xanax held.  She seemed to improve.  Rather sedated on Dilaudid but improved just on fentanyl as needed.  Had pain and placed on gabapentin but concern of confusion so that was held. ? ?By the time of discharge, the patient was walking well the hallways, eating food, having flatus.  Pain was well-controlled on an oral medications.  Mentally sharp and oriented.  Feeling much better.  Drain removed on day of discharge.   Based on meeting discharge criteria and continuing to recover, I felt it was safe for the patient to be discharged from the hospital to further recover with close followup. Postoperative recommendations were discussed in detail.  They are written as well. ? ?Discharged Condition: Fair but improved. ? ?Discharge Exam: ?Blood pressure (!) 151/72, pulse 74, temperature 98.6 ?F (37 ?C), temperature source Oral, resp. rate 16, height 5\' 3"  (1.6 m), weight 89.8 kg, SpO2 92 %. ? ?General: Pt awake/alert/oriented x4 in No acute distress ?Eyes: PERRL, normal EOM.  Sclera clear.  No icterus ?Neuro: CN II-XII intact w/o focal sensory/motor deficits. ?Lymph: No head/neck/groin lymphadenopathy ?Psych:  No delerium/psychosis/paranoia ?HENT: Normocephalic, Mucus membranes moist.  No thrush ?Neck: Supple, No tracheal deviation ?Chest:  No chest wall pain w good excursion ?CV:  Pulses intact.  Regular rhythm ?MS: Normal AROM mjr joints.  No obvious deformity ?Abdomen: Soft.  Nondistended.  Nontender.  No evidence of peritonitis.  No incarcerated hernias. ?Ext:  SCDs BLE.  No mjr edema.  No cyanosis ?Skin: No petechiae / purpura ? ? ?Disposition:  ? ? Follow-up Information   ? ? Michael Boston, MD Follow up in 3 week(s).   ?Specialties: General Surgery, Colon and Rectal Surgery ?Why: To follow up after your operation ?Contact information: ?[address] ?[address] ?[place] [address] ?[phone] ? ? ?  ?  ? ? [facility] Follow up in 1 week(s).   ?Specialty: General Surgery ?Why: To have your drain removed & incisions re-checked ?Contact information: ?[address] ?[address] ?[place] ?[phone] ? ?  ?  ? ?  ?  ? ?  ? ? ?Discharge disposition: 01-Home or Self Care ? ? ? ? ? ? ?Discharge Instructions   ? ? Call MD for:   Complete by: As directed ?  ? Temperature > 101.33F  ? Call MD for:  extreme fatigue   Complete by: As directed ?  ? Call MD for:  hives   Complete by: As  directed ?  ? Call MD for:  persistant nausea and vomiting   Complete by: As directed ?  ? Call MD for:  redness, tenderness, or signs of infection (pain, swelling, redness, odor or green/yellow discharge around incision site)   Complete by: As directed ?  ? Call MD for:  severe uncontrolled pain   Complete by: As directed ?  ? Diet general   Complete by: As directed ?  ? SEE ESOPHAGEAL SURGERY DIET INSTRUCTIONS ? ?We using usually start you out on a pureed (blenderized) diet. ?Expect some sticking with swallowing over the next 1-2 months.   ?This is due to swelling around your esophagus at the wrap & hiatal diaphragm repair.  It will gradually ease off over the next few months.  ? Discharge instructions   Complete by: As directed ?  ? Please see discharge instruction sheets.   ?Also refer to any handouts/printouts that may have been given from the [facility] surgery office (if you visited [place] there before surgery) ?Please call our office if you have any questions or concerns [phone]  ? Driving Restrictions   Complete by: As directed ?  ? No driving until off narcotics and can safely swerve away without pain during an emergency  ? Increase activity slowly   Complete by: As  directed ?  ? Lifting restrictions   Complete by: As directed ?  ? Avoid heavy lifting initially, <20 pounds at first.   ?Do not push through pain.   ?You have no specific weight limit: If it hurts to do, DON'T DO IT.    ?If you feel no pain, you are not injuring anything.  Pain will protect you from injury.   ?Coughing and sneezing are far more stressful to your incision than any lifting.   ?Avoid resuming heavy lifting (>50 pounds) or other intense activity until off all narcotic pain medications.   ?When want to exercise more, give yourself 2 weeks to gradually get back to full intense exercise/activity.  ? May shower / Bathe   Complete by: As directed ?  ? SHOWER EVERY DAY.  It is fine for dressings or wounds to be washed/rinsed.  Use gentle  soap & water.  This will help the incisions and/or wounds get clean & minimize infection.  ? May walk up steps   Complete by: As directed ?  ? Remove dressing in 72 hours   Complete by: As directed ?  ? You h

## 2021-07-20 NOTE — Progress Notes (Signed)
? ?PROGRESS NOTE ? ? ? ?Deborah Farley  UEA:[id number] DOB: 1945/12/13 DOA: 07/15/2021 ?PCP: Abigail MiyamotoPerry, Lawrence Edward, MD ? ? ?Brief Narrative: ?Patient with past medical history of CAD, type II DM, depression, HLD, hypothyroidism, HTN, obesity, rheumatoid arthritis on methotrexate. ?Presented with ongoing issues with hiatal hernia and underwent a robotic reduction of paraesophageal hiatal hernia on 07/15/2021. ?TRH was consulted for confusion and altered mental status. ? ? ?Assessment and Plan: ?Acute metabolic encephalopathy-resolved as of 07/20/2021 ?Concern this was possibly secondary to polypharmacy (toxic) vs metabolic encephalopathy. Patient's confusion resolved. CT imaging unremarkable. ? ?Constipation ?Continues to have no bowel movement. Post-operative. Management per surgery. ? ?Hypokalemia ?Resolved with supplementation. ? ?Acute respiratory failure with hypoxia (HCC) ?Likely secondary to left sided pleural effusion. On room air this morning at rest. ?-See problem, Pleural effusion on left ? ?Pleural effusion on left ?Associated acute respiratory failure with hypoxia and left sided chest pain. Imaging. Patient started on Bumex. Moderate amount of fluid on chest x-ray (3/14). ?-Ambulatory pulse ox ?-If continues to have hypoxia, would recommend left thoracentesis prior to discharge with labs. Otherwise, outpatient follow-up. ? ?Transaminitis ?Postop.  Likely vascular congestion. ?Anticipate improvement with diuresis. ?Monitor. ? ?Chronic systolic congestive heart failure (HCC) ?On Bumex while inpatient.  With continue daily scheduled doses. ? ?Obesity (BMI 35.0-39.9 without comorbidity) ?Body mass index is 35.07 kg/m?. ? ?Hypothyroidism ?-Continue Synthroid 25 mcg daily ? ?Hypertension ?-Continue diltiazem ? ? ? ?DVT prophylaxis: Per Primary ?Code Status:   Code Status: Full Code ?Family Communication: None at bedside ?Disposition Plan: Per primary ? ? ? ?Subjective: ?No dyspnea. No chest  pain. ? ?Objective: ?BP (!) 151/72 (BP Location: Right Arm)   Pulse 74   Temp 98.6 ?F (37 ?C) (Oral)   Resp 16   Ht 5\' 3"  (1.6 m)   Wt 89.8 kg   SpO2 92%   BMI 35.07 kg/m?  ? ?Examination: ? ?General exam: Appears calm and comfortable ?Respiratory system: Diminished. Respiratory effort normal. ?Cardiovascular system: S1 & S2 heard, RRR. ?Gastrointestinal system: Abdomen is nondistended, soft and nontender. No organomegaly or masses felt. Normal bowel sounds heard. ?Central nervous system: Alert. ?Musculoskeletal: No calf tenderness ?Skin: No cyanosis. No rashes ? ? ?Data Reviewed: I have personally reviewed following labs and imaging studies ? ?CBC ?Lab Results  ?Component Value Date  ? WBC 11.5 (H) 07/19/2021  ? RBC 3.95 07/19/2021  ? HGB 13.2 07/19/2021  ? HCT 39.4 07/19/2021  ? MCV 99.7 07/19/2021  ? MCH 33.4 07/19/2021  ? PLT 151 07/19/2021  ? MCHC 33.5 07/19/2021  ? RDW 15.3 07/19/2021  ? LYMPHSABS 2.2 04/05/2021  ? MONOABS 1,010 (H) 11/16/2016  ? EOSABS 0.2 04/05/2021  ? BASOSABS 0.1 04/05/2021  ? ? ? ?Last metabolic panel ?Lab Results  ?Component Value Date  ? NA 137 07/20/2021  ? K 4.1 07/20/2021  ? CL 103 07/20/2021  ? CO2 27 07/20/2021  ? BUN 12 07/20/2021  ? CREATININE 0.83 07/20/2021  ? GLUCOSE 127 (H) 07/20/2021  ? GFRNONAA >60 07/20/2021  ? GFRAA 67 01/14/2020  ? CALCIUM 8.5 (L) 07/20/2021  ? PROT 5.9 (L) 07/19/2021  ? ALBUMIN 3.2 (L) 07/19/2021  ? LABGLOB 3.0 04/05/2021  ? AGRATIO 1.3 04/05/2021  ? BILITOT 0.9 07/19/2021  ? ALKPHOS 73 07/19/2021  ? AST 95 (H) 07/19/2021  ? ALT 99 (H) 07/19/2021  ? ANIONGAP 7 07/20/2021  ? ? ?GFR: ?Estimated Creatinine Clearance: 61.4 mL/min (by C-G formula based on SCr of 0.83 mg/dL). ? ?Recent Results (from the  past 240 hour(s))  ?SARS CORONAVIRUS 2 (TAT 6-24 HRS)     Status: None  ? Collection Time: 07/12/21 11:37 AM  ?Result Value Ref Range Status  ? SARS Coronavirus 2 NEGATIVE NEGATIVE Final  ?  Comment: (NOTE) ?SARS-CoV-2 target nucleic acids are NOT  DETECTED. ? ?The SARS-CoV-2 RNA is generally detectable in upper and lower ?respiratory specimens during the acute phase of infection. Negative ?results do not preclude SARS-CoV-2 infection, do not rule out ?co-infections with other pathogens, and should not be used as the ?sole basis for treatment or other patient management decisions. ?Negative results must be combined with clinical observations, ?patient history, and epidemiological information. The expected ?result is Negative. ? ?Fact Sheet for Patients: ?HairSlick.no ? ?Fact Sheet for Healthcare Providers: ?quierodirigir.com ? ?This test is not yet approved or cleared by the [place] FDA and  ?has been authorized for detection and/or diagnosis of SARS-CoV-2 by ?FDA under an Emergency Use Authorization (EUA). This EUA will remain  ?in effect (meaning this test can be used) for the duration of the ?COVID-19 declaration under Se ction 564(b)(1) of the Act, 21 U.S.C. ?section 360bbb-3(b)(1), unless the authorization is terminated or ?revoked sooner. ? ?Performed at [facility], [address] [place], [place] ?[address] ?  ?  ? ? ?Radiology Studies: ?CT HEAD WO CONTRAST ( ) ? ?Result Date: 07/18/2021 ?CLINICAL DATA:  TIA, right temporal headache, confusion, right-sided weakness EXAM: CT HEAD WITHOUT CONTRAST TECHNIQUE: Contiguous axial images were obtained from the base of the skull through the vertex without intravenous contrast. RADIATION DOSE REDUCTION: This exam was performed according to the departmental dose-optimization program which includes automated exposure control, adjustment of the mA and/or kV according to patient size and/or use of iterative reconstruction technique. COMPARISON:  None. FINDINGS: Brain: No evidence of acute infarction, hemorrhage, hydrocephalus, extra-axial collection or mass lesion/mass effect. Mild periventricular and deep white matter hypodensity. Vascular: No  hyperdense vessel or unexpected calcification. Skull: Normal. Negative for fracture or focal lesion. Sinuses/Orbits: No acute finding. Other: None. IMPRESSION: 1. No acute intracranial pathology. No noncontrast CT evidence of acute stroke or hemorrhage. Consider MRI to more sensitively evaluate for acute diffusion restricting infarction if clinically suspected. 2. Small-vessel white matter disease. Electronically Signed   By: Jearld Lesch M.D.   On: 07/18/2021 15:24  ? ?DG CHEST PORT 1 VIEW ? ?Result Date: 07/19/2021 ?CLINICAL DATA:  Shortness of breath EXAM: PORTABLE CHEST 1 VIEW COMPARISON:  03/11/2021 FINDINGS: Transverse diameter of heart is slightly increased. There is interval appearance of moderate left pleural effusion. Evaluation of left lower lung fields for infiltrates is limited by the effusion. There is a catheter overlying the cardiac shadow, possibly in the retrocardiac location related to possible recent surgery. There is poor inspiration. Right lung is clear. IMPRESSION: There is interval appearance of moderate left pleural effusion. Possibility of underlying atelectasis/pneumonia is not excluded. There is a catheter overlying the cardiac shadow, possibly surgical drain in the retrocardiac region related to recent surgery. Electronically Signed   By: Ernie Avena M.D.   On: 07/19/2021 11:38   ? ? ? LOS: 5 days  ? ? ?Jacquelin Hawking, MD ?[facility] ?07/20/2021, 9:03 AM ? ? ?If 7PM-7AM, please contact night-coverage ?[email or web address] ? ?

## 2021-07-21 ENCOUNTER — Ambulatory Visit: Payer: Medicare Other | Admitting: *Deleted

## 2021-07-21 DIAGNOSIS — R269 Unspecified abnormalities of gait and mobility: Secondary | ICD-10-CM

## 2021-07-21 DIAGNOSIS — E119 Type 2 diabetes mellitus without complications: Secondary | ICD-10-CM

## 2021-07-21 DIAGNOSIS — F32A Depression, unspecified: Secondary | ICD-10-CM

## 2021-07-21 DIAGNOSIS — Z658 Other specified problems related to psychosocial circumstances: Secondary | ICD-10-CM

## 2021-07-21 DIAGNOSIS — I25110 Atherosclerotic heart disease of native coronary artery with unstable angina pectoris: Secondary | ICD-10-CM

## 2021-07-21 NOTE — Patient Instructions (Signed)
Visit Information ? ?Thank you for taking time to visit with me today. Please don't hesitate to contact me if I can be of assistance to you before our next scheduled telephone appointment. ? ?  ? ?Our next appointment is 07/25/21   ? ?Please call the care guide team at [phone] if you need to cancel or reschedule your appointment.  ? ?If you are experiencing a Mental Health or Behavioral Health Crisis or need someone to talk to, please call the [place] National Suicide Prevention Lifeline: 208-701-52681-2533677598 or TTY: 407-697-24461-800-799-4 TTY 240-145-9237(1-9077787512) to talk to a trained counselor ?call 911  ? ?Patient verbalizes understanding of instructions and care plan provided today and agrees to view in MyChart. Active MyChart status confirmed with patient.   ?Reece LevyJanet Jessel Gettinger MSW, LCSW ?Licensed Clinical Social Worker ?[facility]   ?[phone]  ?
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
## 2021-08-25 ENCOUNTER — Telehealth: Payer: Self-pay

## 2021-08-25 ENCOUNTER — Ambulatory Visit (INDEPENDENT_AMBULATORY_CARE_PROVIDER_SITE_OTHER): Payer: Medicare Other | Admitting: *Deleted

## 2021-08-25 DIAGNOSIS — E119 Type 2 diabetes mellitus without complications: Secondary | ICD-10-CM

## 2021-08-25 DIAGNOSIS — F4310 Post-traumatic stress disorder, unspecified: Secondary | ICD-10-CM

## 2021-08-25 DIAGNOSIS — F331 Major depressive disorder, recurrent, moderate: Secondary | ICD-10-CM

## 2021-08-25 DIAGNOSIS — I25110 Atherosclerotic heart disease of native coronary artery with unstable angina pectoris: Secondary | ICD-10-CM

## 2021-08-25 NOTE — Telephone Encounter (Signed)
Patient called stating that she is not pleased with her progress to solid foods since her surgery a month ago and would like to see if Dr Marina GoodellPerry can do anything different.  I explained to the patient that she should follow-up with the surgeon for any care pertaining to her surgery as they have a plan of care to meet her goals.  I did offer an appointment for any other medical needs and she declined at this time. ?

## 2021-08-25 NOTE — Patient Instructions (Signed)
Visit Information ? ?Thank you for taking time to visit with me today. Please don't hesitate to contact me if I can be of assistance to you before our next scheduled telephone appointment. ? ?Our next appointment is by telephone on 09/22/21  ? ?Please call the care guide team at [phone] if you need to cancel or reschedule your appointment.  ? ?If you are experiencing a Mental Health or Behavioral Health Crisis or need someone to talk to, please call the [place] National Suicide Prevention Lifeline: 352-619-5701 or TTY: 272-589-9939 TTY (820)676-7854) to talk to a trained counselor ?call 911  ? ?The patient verbalized understanding of instructions, educational materials, and care plan provided today and declined offer to receive copy of patient instructions, educational materials, and care plan.  ? ?Reece Levy MSW, LCSW ?Licensed Clinical Social Worker ?[facility]   ?[phone]  ?
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
## 2021-08-27 ENCOUNTER — Other Ambulatory Visit: Payer: Self-pay | Admitting: Legal Medicine

## 2021-08-30 ENCOUNTER — Other Ambulatory Visit: Payer: Self-pay

## 2021-08-30 DIAGNOSIS — T7840XA Allergy, unspecified, initial encounter: Secondary | ICD-10-CM | POA: Insufficient documentation

## 2021-08-31 ENCOUNTER — Ambulatory Visit: Payer: Medicare Other | Admitting: Cardiology

## 2021-08-31 ENCOUNTER — Encounter: Payer: Self-pay | Admitting: Cardiology

## 2021-08-31 VITALS — BP 164/102 | HR 96 | Ht 63.0 in | Wt 198.0 lb

## 2021-08-31 DIAGNOSIS — E782 Mixed hyperlipidemia: Secondary | ICD-10-CM | POA: Diagnosis not present

## 2021-08-31 DIAGNOSIS — Q245 Malformation of coronary vessels: Secondary | ICD-10-CM

## 2021-08-31 DIAGNOSIS — E669 Obesity, unspecified: Secondary | ICD-10-CM

## 2021-08-31 DIAGNOSIS — I2510 Atherosclerotic heart disease of native coronary artery without angina pectoris: Secondary | ICD-10-CM

## 2021-08-31 HISTORY — DX: Atherosclerotic heart disease of native coronary artery without angina pectoris: I25.10

## 2021-08-31 NOTE — Progress Notes (Signed)
?Cardiology Office Note:   ? ?Date:  08/31/2021  ? ?ID:  Deborah Farley, DOB 06-18-1945, MRN [id number] ? ?PCP:  Abigail MiyamotoPerry, Lawrence Edward, MD  ?Cardiologist:  Garwin Brothersajan R Byford Schools, MD  ? ?Referring MD: Abigail MiyamotoPerry, Lawrence Edward,*  ? ? ?ASSESSMENT:   ? ?1. Coronary-myocardial bridge   ?2. Mild CAD   ?3. Obesity (BMI 35.0-39.9 without comorbidity)   ?4. Mixed hyperlipidemia   ? ?PLAN:   ? ?In order of problems listed above: ? ?Coronary artery disease: Secondary prevention stressed with the patient.  Importance of compliance with diet and medication stressed and she vocalized understanding.  She is asymptomatic overall. ?Essential hypertension: Stable blood pressure.  She checks her blood pressure at home and tells me that she has an element of whitecoat hypertension and she was under a little bit of stress today.  She was advised to keep a track of her blood pressures at home and let us know if they are elevated. ?Mixed dyslipidemia: On lipid-lowering therapy lipids reviewed.  Diet emphasized. ?Obesity: Weight reduction stressed risks of obesity explained and she promises to do better. ?Myocardial bridge: This was found on evaluation earlier.  Currently she is asymptomatic from it and we will continue to monitor. ?Patient will be seen in follow-up appointment in 9 months or earlier if the patient has any concerns ? ? ? ?Medication Adjustments/Labs and Tests Ordered: ?Current medicines are reviewed at length with the patient today.  Concerns regarding medicines are outlined above.  ?No orders of the defined types were placed in this encounter. ? ?No orders of the defined types were placed in this encounter. ? ? ? ?No chief complaint on file. ?  ? ?History of Present Illness:   ? ?Deborah CrazierJoyce Farley is a 76 y.o. female.  Patient has past medical history of mild coronary artery disease, myocardial bridge, essential hypertension dyslipidemia and diabetes mellitus.  She denies any problems at this time and takes care of activities of  daily living.  She has undergone Nissen fundoplication and feels much better.  She has been ambulating. ? ?Past Medical History:  ?Diagnosis Date  ? Acid reflux 02/29/2016  ? Acute respiratory failure with hypoxia (HCC) 07/19/2021  ? Acute sinusitis 03/19/2020  ? Allergy   ? Chronic idiopathic constipation 07/02/2015  ? Chronic pain syndrome 11/30/2020  ? Chronic systolic congestive heart failure (HCC)   ? Constipation 07/19/2021  ? Coronary artery disease involving native coronary artery of native heart with unstable angina pectoris (HCC) 07/02/2015  ? Formatting of this note might be different from the original. [place]  ? 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 Degeneration of lumbar intervertebral disc 03/02/2021  ? Depression   ? Depression, major, single episode, moderate (HCC) 08/31/2020  ? Diet-controlled diabetes mellitus (HCC)   ? Dyspnea on exertion 01/16/2019  ? Epigastric pain 04/08/2015  ? Gait disturbance 03/15/2017  ? Heart disease   ? Hiatal hernia 07/02/2015  ? High risk medication use 04/12/2016  ? Methotrexate PLQ Eye Exam: 07/28/16 WNL @ [facility] Follow up in 6 months.   ? History of diabetes mellitus 05/31/2016  ? Hyperlipidemia 02/29/2016  ? Hypertension   ? Hypokalemia 07/19/2021  ? Hypothyroidism   ? Hypoxia 07/18/2021  ? Incarcerated hiatal hernia s/p robotic repair 07/15/2021 07/15/2021  ? Left-sided chest pain 07/19/2021  ? Major depressive disorder, recurrent episode, moderate (HCC) 07/02/2015  ? Malaise and fatigue 07/02/2015  ? Memory difficulty   ? Mild protein-calorie malnutrition (HCC) 07/18/2021  ? Nausea and vomiting 05/17/2021  ?  Obesity (BMI 35.0-39.9 without comorbidity) 02/29/2016  ? Obesity, diabetes, and hypertension syndrome (HCC)   ? Organoaxial gastric volvulus 05/17/2021  ? Pleural effusion on left 07/19/2021  ? Pre-diabetes   ? Progressive angina (HCC) 08/11/2015  ? PTSD (post-traumatic stress disorder) 11/30/2020  ? Rheumatoid arthritis (HCC) 04/12/2016  ? Spastic esophagus    ? Suspected pulmonary embolism 03/11/2019  ? Thrombocytopenia (HCC) 07/18/2021  ? Transaminitis 07/18/2021  ? ? ?Past Surgical History:  ?Procedure Laterality Date  ? ABDOMINAL HYSTERECTOMY    ? BACK SURGERY    ? CARDIAC CATHETERIZATION N/A 08/12/2015  ? Procedure: Left Heart Cath and Coronary Angiography;  Surgeon: Yates Decamp, MD;  Location: [facility] INVASIVE CV LAB;  Service: Cardiovascular;  Laterality: N/A;  ? ESOPHAGEAL MANOMETRY N/A 07/01/2021  ? Procedure: ESOPHAGEAL MANOMETRY (EM);  Surgeon: Meryl Dare, MD;  Location: [facility] ENDOSCOPY;  Service: Endoscopy;  Laterality: N/A;  ? LEFT HEART CATH AND CORONARY ANGIOGRAPHY N/A 02/20/2018  ? Procedure: LEFT HEART CATH AND CORONARY ANGIOGRAPHY;  Surgeon: Lennette Bihari, MD;  Location: [facility] INVASIVE CV LAB;  Service: Cardiovascular;  Laterality: N/A;  ? LEFT HEART CATH AND CORONARY ANGIOGRAPHY N/A 01/19/2020  ? Procedure: LEFT HEART CATH AND CORONARY ANGIOGRAPHY;  Surgeon: Marykay Lex, MD;  Location[facility] INVAS[facility] CV LAB;  Service: Cardiovascular;  Laterality: N/A;  ? TOTAL ABDOMINAL HYSTERECTOMY W/ BILATERAL SALPINGOOPHORECTOMY    ? XI ROBOTIC ASSISTED HIATAL HERNIA REPAIR N/A 07/15/2021  ? Procedure: robotic paraesophageal hiatal hernia with fundoplication, diaphragm release and mesh repair and bilateral TAP block;  Surgeon: Karie Soda, MD;  Location: [facility] ORS;  Service: General;  Laterality: N/A;  ? ? ?Current Medications: ?Current Meds  ?Medication Sig  ? ALPRAZolam (XANAX) 0.5 MG tablet Take 0.5 tablets (0.25 mg total) by mouth daily as needed for anxiety.  ? aspirin EC 81 MG tablet Take 81 mg by mouth daily.  ? bumetanide (BUMEX) 1 MG tablet Take 1 tablet (1 mg total) by mouth daily as needed (swelling).  ? buPROPion (WELLBUTRIN XL) 300 MG 24 hr tablet Take 1 tablet (300 mg total) by mouth at bedtime.  ? Cholecalciferol (VITAMIN D) 2000 units CAPS Take 2,000 Units by mouth daily.  ? diltiazem (CARDIZEM CD) 120 MG 24 hr capsule Take 1 capsule (120 mg total) by mouth  daily.  ? folic acid (FOLVITE) 1 MG tablet Take 1 tablet (1 mg total) by mouth daily.  ? isosorbide mononitrate (IMDUR) 30 MG 24 hr tablet Take 30 mg by mouth daily.  ? latanoprost (XALATAN) 0.005 % ophthalmic solution Place 1 drop into both eyes at bedtime.  ? levothyroxine (SYNTHROID) 25 MCG tablet Take 1 tablet (25 mcg total) by mouth daily before breakfast.  ? methotrexate 2.5 MG tablet Take 10 tablets by mouth every Sunday. **Caution: Chemotherapy. Protect from light.**  ? metoprolol tartrate (LOPRESSOR) 25 MG tablet Take 1 tablet (25 mg total) by mouth 2 (two) times daily.  ? Multiple Vitamin (MULTIVITAMIN) capsule Take 1 capsule by mouth daily.  ? nitroGLYCERIN (NITROSTAT) 0.4 MG SL tablet Place 1 tablet (0.4 mg total) under the tongue every 5 (five) minutes as needed for chest pain.  ? omeprazole (PRILOSEC) 20 MG capsule Take 1 capsule (20 mg total) by mouth daily.  ? ondansetron (ZOFRAN) 4 MG tablet Take 1 tablet (4 mg total) by mouth every 8 (eight) hours as needed for nausea.  ? polyethylene glycol (MIRALAX / GLYCOLAX) 17 g packet Take 17 g by mouth daily as needed for mild constipation.  ?  pravastatin (PRAVACHOL) 20 MG tablet Take 1 tablet (20 mg total) by mouth daily.  ? promethazine (PHENERGAN) 25 MG suppository Place 1 suppository (25 mg total) rectally every 8 (eight) hours as needed for refractory nausea / vomiting.  ? sertraline (ZOLOFT) 100 MG tablet Take 50 mg by mouth daily.  ? traMADol (ULTRAM) 50 MG tablet Take 1 tablet (50 mg total) by mouth every 6 (six) hours as needed.  ? traZODone (DESYREL) 50 MG tablet Take 50 mg by mouth at bedtime.  ? Vitamin D, Ergocalciferol, (DRISDOL) 1.25 MG (50000 UNIT) CAPS capsule Take 1 capsule (50,000 Units total) by mouth every Sunday.  ?  ? ?Allergies:   Codeine, Iodine, and Shellfish allergy  ? ?Social History  ? ?Socioeconomic History  ? Marital status: Married  ?  Spouse name: Dandria Griego  ? Number of children: 2  ? Years of education: 104  ? Highest  education level: Some college, no degree  ?Occupational History  ? Occupation: retired  ?Tobacco Use  ? Smoking status: Never  ?  Passive exposure: Never  ? Smokeless tobacco: Never  ?Vaping Use  ? Vaping Use

## 2021-08-31 NOTE — Patient Instructions (Signed)

## 2021-09-04 DIAGNOSIS — E119 Type 2 diabetes mellitus without complications: Secondary | ICD-10-CM

## 2021-09-04 DIAGNOSIS — I25110 Atherosclerotic heart disease of native coronary artery with unstable angina pectoris: Secondary | ICD-10-CM

## 2021-09-04 DIAGNOSIS — F331 Major depressive disorder, recurrent, moderate: Secondary | ICD-10-CM

## 2021-09-26 ENCOUNTER — Other Ambulatory Visit: Payer: Self-pay | Admitting: Legal Medicine

## 2021-09-26 DIAGNOSIS — E559 Vitamin D deficiency, unspecified: Secondary | ICD-10-CM

## 2021-10-20 ENCOUNTER — Ambulatory Visit: Payer: Medicare Other | Admitting: *Deleted

## 2021-10-20 ENCOUNTER — Telehealth: Payer: Self-pay | Admitting: *Deleted

## 2021-10-20 DIAGNOSIS — Z658 Other specified problems related to psychosocial circumstances: Secondary | ICD-10-CM

## 2021-10-20 DIAGNOSIS — F331 Major depressive disorder, recurrent, moderate: Secondary | ICD-10-CM

## 2021-10-20 DIAGNOSIS — I25110 Atherosclerotic heart disease of native coronary artery with unstable angina pectoris: Secondary | ICD-10-CM

## 2021-10-20 DIAGNOSIS — E119 Type 2 diabetes mellitus without complications: Secondary | ICD-10-CM

## 2021-10-20 DIAGNOSIS — F32A Depression, unspecified: Secondary | ICD-10-CM

## 2021-10-20 NOTE — Telephone Encounter (Signed)
   Telephone encounter was:  Successful.  10/20/2021 Name: Farris Blash MRN: [id number] DOB: 11-07-1945  Nayeliz Hipp is a 76 y.o. year old female who is a primary care patient of Abigail Miyamoto, MD . The community resource team was consulted for assistance with  housing  Care guide performed the following interventions: Patient provided with information about care guide support team and interviewed to confirm resource needs.  Follow Up Plan:  Care guide will follow up with patient by phone over the next days  Alois Cliche -[facility] Guide , Embedded Care Coordination [facility], Care Management  [phone] [address] [place] , [place] [address] Email : [email or web address]
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
## 2021-10-20 NOTE — Patient Instructions (Signed)
Visit Information  Thank you for taking time to visit with me today. Please don't hesitate to contact me if I can be of assistance to you before our next scheduled telephone appointment.  Following are the goals we discussed today:  Expect phone call from [facility] Expect phone call/email from Care Guide with resources for housing options  Our next appointment is by telephone on 11/03/21    Please call the care guide team at [phone] if you need to cancel or reschedule your appointment.   If you are experiencing a Mental Health or Behavioral Health Crisis or need someone to talk to, please call the [place] National Suicide Prevention Lifeline: 253-589-4787 or TTY: (616)059-3454 TTY (361)088-9043) to talk to a trained counselor call 911   Patient verbalizes understanding of instructions and care plan provided today and agrees to view in MyChart. Active MyChart status and patient understanding of how to access instructions and care plan via MyChart confirmed with patient.     Reece Levy MSW, LCSW Licensed Clinical Social Worker [facility]   [phone]

## 2021-10-25 DIAGNOSIS — K224 Dyskinesia of esophagus: Secondary | ICD-10-CM | POA: Diagnosis not present

## 2021-10-25 DIAGNOSIS — Z9889 Other specified postprocedural states: Secondary | ICD-10-CM | POA: Diagnosis not present

## 2021-10-25 DIAGNOSIS — Z8719 Personal history of other diseases of the digestive system: Secondary | ICD-10-CM | POA: Diagnosis not present

## 2021-11-02 ENCOUNTER — Telehealth: Payer: Self-pay | Admitting: *Deleted

## 2021-11-02 NOTE — Telephone Encounter (Signed)
   Telephone encounter was:  Successful.  11/02/2021 Name: Deborah Farley MRN: [id number] DOB: Dec 01, 1945  Deborah Farley is a 76 y.o. year old female who is a primary care patient of Abigail Miyamoto, MD . The community resource team was consulted for assistance with  Housing  Care guide performed the following interventions: Follow up call placed to the patient to discuss status of referral.  Follow Up Plan:  no further follow up needed at this time  Alois Cliche -[facility] Guide , Embedded Care Coordination [facility], Care Management  [phone] [address] [place] , [place] [address] Email : [email or web address]

## 2021-11-14 DIAGNOSIS — Z9889 Other specified postprocedural states: Secondary | ICD-10-CM | POA: Diagnosis not present

## 2021-11-14 DIAGNOSIS — K3189 Other diseases of stomach and duodenum: Secondary | ICD-10-CM | POA: Diagnosis not present

## 2021-11-14 DIAGNOSIS — R1310 Dysphagia, unspecified: Secondary | ICD-10-CM | POA: Diagnosis not present

## 2021-11-14 DIAGNOSIS — Z8719 Personal history of other diseases of the digestive system: Secondary | ICD-10-CM

## 2021-11-14 DIAGNOSIS — K440 Diaphragmatic hernia with obstruction, without gangrene: Secondary | ICD-10-CM | POA: Diagnosis not present

## 2021-11-14 HISTORY — DX: Personal history of other diseases of the digestive system: Z87.19

## 2021-11-16 ENCOUNTER — Telehealth: Payer: Medicare Other

## 2021-11-17 ENCOUNTER — Telehealth: Payer: Medicare Other

## 2021-11-24 ENCOUNTER — Telehealth: Payer: Medicare Other

## 2021-11-25 ENCOUNTER — Other Ambulatory Visit: Payer: Self-pay | Admitting: Legal Medicine

## 2021-11-26 ENCOUNTER — Other Ambulatory Visit: Payer: Self-pay | Admitting: Legal Medicine

## 2021-11-26 DIAGNOSIS — E559 Vitamin D deficiency, unspecified: Secondary | ICD-10-CM

## 2021-11-29 ENCOUNTER — Other Ambulatory Visit: Payer: Self-pay

## 2021-11-29 DIAGNOSIS — E559 Vitamin D deficiency, unspecified: Secondary | ICD-10-CM

## 2021-11-29 MED ORDER — VITAMIN D (ERGOCALCIFEROL) 1.25 MG (50000 UNIT) PO CAPS: ORAL_CAPSULE | ORAL | 0 refills | Status: DC

## 2021-12-01 ENCOUNTER — Other Ambulatory Visit: Payer: Self-pay | Admitting: Legal Medicine

## 2021-12-19 ENCOUNTER — Other Ambulatory Visit: Payer: Self-pay | Admitting: Surgery

## 2021-12-23 ENCOUNTER — Encounter: Payer: Self-pay | Admitting: *Deleted

## 2021-12-25 ENCOUNTER — Other Ambulatory Visit: Payer: Self-pay | Admitting: Legal Medicine

## 2021-12-27 ENCOUNTER — Telehealth: Payer: Self-pay

## 2021-12-27 NOTE — Telephone Encounter (Signed)
I left message on voicemail to call us back an set up an appointment for chronic fasting.

## 2022-01-05 ENCOUNTER — Telehealth: Payer: Self-pay

## 2022-01-05 NOTE — Telephone Encounter (Signed)
I called patient to set up an appointment x2 for chronic visit. Last appointment was 03/2021. No voicemail.

## 2022-01-23 ENCOUNTER — Encounter (HOSPITAL_COMMUNITY): Payer: Self-pay | Admitting: Surgery

## 2022-01-24 ENCOUNTER — Encounter (HOSPITAL_COMMUNITY): Payer: Self-pay | Admitting: Surgery

## 2022-01-27 ENCOUNTER — Ambulatory Visit (HOSPITAL_COMMUNITY): Payer: Medicare Other | Admitting: Certified Registered Nurse Anesthetist

## 2022-01-27 ENCOUNTER — Other Ambulatory Visit: Payer: Self-pay

## 2022-01-27 ENCOUNTER — Ambulatory Visit (HOSPITAL_BASED_OUTPATIENT_CLINIC_OR_DEPARTMENT_OTHER): Payer: Medicare Other | Admitting: Certified Registered Nurse Anesthetist

## 2022-01-27 ENCOUNTER — Encounter (HOSPITAL_COMMUNITY)
Admission: RE | Disposition: A | Discharge status: Home / Self Care | Payer: Self-pay | Source: Home / Self Care | Attending: Surgery

## 2022-01-27 ENCOUNTER — Encounter (HOSPITAL_COMMUNITY): Payer: Self-pay | Admitting: Surgery

## 2022-01-27 ENCOUNTER — Ambulatory Visit (HOSPITAL_COMMUNITY)
Admission: RE | Admit: 2022-01-27 | Discharge: 2022-01-27 | Disposition: A | Discharge status: Home / Self Care | Payer: Medicare Other | Attending: Surgery | Admitting: Surgery

## 2022-01-27 DIAGNOSIS — K9189 Other postprocedural complications and disorders of digestive system: Secondary | ICD-10-CM

## 2022-01-27 DIAGNOSIS — I25119 Atherosclerotic heart disease of native coronary artery with unspecified angina pectoris: Secondary | ICD-10-CM

## 2022-01-27 DIAGNOSIS — Z9889 Other specified postprocedural states: Secondary | ICD-10-CM | POA: Insufficient documentation

## 2022-01-27 DIAGNOSIS — I10 Essential (primary) hypertension: Secondary | ICD-10-CM | POA: Insufficient documentation

## 2022-01-27 DIAGNOSIS — I110 Hypertensive heart disease with heart failure: Secondary | ICD-10-CM | POA: Insufficient documentation

## 2022-01-27 DIAGNOSIS — I5022 Chronic systolic (congestive) heart failure: Secondary | ICD-10-CM | POA: Insufficient documentation

## 2022-01-27 DIAGNOSIS — E119 Type 2 diabetes mellitus without complications: Secondary | ICD-10-CM | POA: Diagnosis not present

## 2022-01-27 DIAGNOSIS — I509 Heart failure, unspecified: Secondary | ICD-10-CM

## 2022-01-27 DIAGNOSIS — R1310 Dysphagia, unspecified: Secondary | ICD-10-CM | POA: Insufficient documentation

## 2022-01-27 HISTORY — PX: BALLOON DILATION: SHX5330

## 2022-01-27 HISTORY — PX: KENALOG INJECTION: SHX5298

## 2022-01-27 HISTORY — PX: ESOPHAGOGASTRODUODENOSCOPY: SHX5428

## 2022-01-27 SURGERY — EGD (ESOPHAGOGASTRODUODENOSCOPY)
Anesthesia: Monitor Anesthesia Care

## 2022-01-27 MED ORDER — TRIAMCINOLONE ACETONIDE 40 MG/ML IJ SUSP
INTRAMUSCULAR | Status: AC
  Filled 2022-01-27: qty 2

## 2022-01-27 MED ORDER — TRIAMCINOLONE ACETONIDE 40 MG/ML IJ SUSP: 40.0000 mg | INTRAMUSCULAR | Status: DC

## 2022-01-27 MED ORDER — TRIAMCINOLONE ACETONIDE 40 MG/ML IJ SUSP
INTRAMUSCULAR | Status: DC | PRN
  Administered 2022-01-27: 40 mg via INTRAMUSCULAR

## 2022-01-27 MED ORDER — PROPOFOL 10 MG/ML IV BOLUS
INTRAVENOUS | Status: AC
  Filled 2022-01-27: qty 20

## 2022-01-27 MED ORDER — ONDANSETRON 4 MG PO TBDP: 4.0000 mg | ORAL_TABLET | Freq: Three times a day (TID) | ORAL | 10 refills | Status: DC | PRN

## 2022-01-27 MED ORDER — PROPOFOL 1000 MG/100ML IV EMUL
INTRAVENOUS | Status: AC
  Filled 2022-01-27: qty 100

## 2022-01-27 MED ORDER — CHLORHEXIDINE GLUCONATE CLOTH 2 % EX PADS: 6.0000 | MEDICATED_PAD | Freq: Once | CUTANEOUS | Status: DC

## 2022-01-27 MED ORDER — GABAPENTIN 300 MG PO CAPS
300.0000 mg | ORAL_CAPSULE | ORAL | Status: AC
  Administered 2022-01-27: 300 mg via ORAL
  Filled 2022-01-27: qty 1

## 2022-01-27 MED ORDER — ACETAMINOPHEN 500 MG PO TABS
1000.0000 mg | ORAL_TABLET | ORAL | Status: AC
  Administered 2022-01-27: 1000 mg via ORAL

## 2022-01-27 MED ORDER — DEXAMETHASONE SODIUM PHOSPHATE 4 MG/ML IJ SOLN
4.0000 mg | INTRAMUSCULAR | Status: AC
  Administered 2022-01-27: 4 mg via INTRAVENOUS
  Filled 2022-01-27: qty 1

## 2022-01-27 MED ORDER — ACETAMINOPHEN 500 MG PO TABS
ORAL_TABLET | ORAL | Status: AC
  Filled 2022-01-27: qty 2

## 2022-01-27 MED ORDER — PROPOFOL 500 MG/50ML IV EMUL
INTRAVENOUS | Status: DC | PRN
  Administered 2022-01-27: 125 ug/kg/min via INTRAVENOUS

## 2022-01-27 MED ORDER — LIDOCAINE 2% (20 MG/ML) 5 ML SYRINGE
INTRAMUSCULAR | Status: DC | PRN
  Administered 2022-01-27: 80 mg via INTRAVENOUS

## 2022-01-27 MED ORDER — LACTATED RINGERS IV SOLN: INTRAVENOUS | Status: DC | PRN

## 2022-01-27 MED ORDER — ONDANSETRON HCL 4 MG/2ML IJ SOLN
INTRAMUSCULAR | Status: DC | PRN
  Administered 2022-01-27: 4 mg via INTRAVENOUS

## 2022-01-27 MED ORDER — PROPOFOL 10 MG/ML IV BOLUS
INTRAVENOUS | Status: DC | PRN
  Administered 2022-01-27 (×2): 20 mg via INTRAVENOUS

## 2022-01-27 NOTE — Anesthesia Preprocedure Evaluation (Signed)
Anesthesia Evaluation  Patient identified by MRN, date of birth, ID band Patient awake    Reviewed: Allergy & Precautions, NPO status , Patient's Chart, lab work & pertinent test results, reviewed documented beta blocker date and time   History of Anesthesia Complications Negative for: history of anesthetic complications  Airway Mallampati: III  TM Distance: >3 FB Neck ROM: Full  Mouth opening: Limited Mouth Opening  Dental no notable dental hx. (+) Missing,    Pulmonary neg shortness of breath, neg COPD, neg recent URI, PE   breath sounds clear to auscultation       Cardiovascular hypertension, Pt. on home beta blockers and Pt. on medications + angina + CAD and +CHF   Rhythm:Regular  TTE 2020 1. Left ventricular ejection fraction, by visual estimation, is 55 to  60%. The left ventricle has normal function. Normal left ventricular size.  Left ventricular septal wall thickness was mildly increased. Mildly  increased left ventricular posterior  wall thickness. There is mildly increased left ventricular hypertrophy.  2. Left ventricular diastolic Doppler parameters are indeterminate  pattern of LV diastolic filling.  3. Global right ventricle has normal systolic function.The right  ventricular size is normal. No increase in right ventricular wall  thickness.  4. Left atrial size was mildly dilated.  5. Right atrial size was normal.  6. The mitral valve is normal in structure. No evidence of mitral valve  regurgitation. No evidence of mitral stenosis.  7. The tricuspid valve is normal in structure. Tricuspid valve  regurgitation is trivial.  8. The pulmonic valve was normal in structure. Pulmonic valve  regurgitation is trivial by color flow Doppler.  9. The aortic valve is normal in structure. Aortic valve regurgitation  was not visualized by color flow Doppler. Structurally normal aortic  valve, with no evidence of  sclerosis or stenosis.  10. Normal pulmonary artery systolic pressure.  11. No prior study for comparison.   Cath 2021 Stable angiographic findings from 2019-mid LAD myocardial bridging appears stable. ->  Would indicate that her symptoms are likely related to bridging. Otherwise normal coronary arteries. Normal LV function and mildly elevated EDP    Neuro/Psych PSYCHIATRIC DISORDERS Anxiety Depression  Neuromuscular disease    GI/Hepatic Neg liver ROS, hiatal hernia, GERD  ,  Endo/Other  diabetes, Well ControlledHypothyroidism   Renal/GU negative Renal ROS  negative genitourinary   Musculoskeletal  (+) Arthritis , Rheumatoid disorders,    Abdominal   Peds  Hematology negative hematology ROS (+)   Anesthesia Other Findings   Reproductive/Obstetrics                             Anesthesia Physical Anesthesia Plan  ASA: 3  Anesthesia Plan: MAC   Post-op Pain Management: Minimal or no pain anticipated   Induction: Intravenous  PONV Risk Score and Plan: 2 and Propofol infusion and Treatment may vary due to age or medical condition  Airway Management Planned: Nasal Cannula and Natural Airway  Additional Equipment: None  Intra-op Plan:   Post-operative Plan:   Informed Consent: I have reviewed the patients History and Physical, chart, labs and discussed the procedure including the risks, benefits and alternatives for the proposed anesthesia with the patient or authorized representative who has indicated his/her understanding and acceptance.     Dental advisory given  Plan Discussed with: CRNA  Anesthesia Plan Comments:         Anesthesia Quick Evaluation

## 2022-01-27 NOTE — Discharge Instructions (Signed)
EATING AFTER YOUR ESOPHAGEAL SURGERY (Stomach Fundoplication, Hiatal Hernia repair, Achalasia surgery, etc)  ######################################################################  EAT Start with a pureed / full liquid diet (see below) Gradually transition to a high fiber diet with a fiber supplement over the next month after discharge.    WALK Walk an hour a day.  Control your pain to do that.    CONTROL PAIN Control pain so that you can walk, sleep, tolerate sneezing/coughing, go up/down stairs.  HAVE A BOWEL MOVEMENT DAILY Keep your bowels regular to avoid problems.  OK to try a laxative to override constipation.  OK to use an antidairrheal to slow down diarrhea.  Call if not better after 2 tries  CALL IF YOU HAVE PROBLEMS/CONCERNS Call if you are still struggling despite following these instructions. Call if you have concerns not answered by these instructions  ######################################################################   After your esophageal surgery, expect some sticking with swallowing over the next 1-2 months.    If food sticks when you eat, it is called "dysphagia".  This is due to swelling around your esophagus at the wrap & hiatal diaphragm repair.  It will gradually ease off over the next few months.  To help you through this temporary phase, we start you out on a pureed (blenderized) diet.  Your first meal in the hospital was thin liquids.  You should have been given a pureed diet by the time you left the hospital.  We ask patients to stay on a pureed diet for the first 2-3 weeks to avoid anything getting "stuck" near your recent surgery.  Don't be alarmed if your ability to swallow doesn't progress according to this plan.  Everyone is different and some diets can advance more or less quickly.    It is often helpful to crush your medications or split them as they can sometimes stick, especially the first week or so.   Some BASIC RULES to follow  are:  Maintain an upright position whenever eating or drinking.  Take small bites - just a teaspoon size bite at a time.  Eat slowly.  It may also help to eat only one food at a time.  Consider nibbling through smaller, more frequent meals & avoid the urge to eat BIG meals  Do not push through feelings of fullness, nausea, or bloatedness  Do not mix solid foods and liquids in the same mouthful  Try not to "wash foods down" with large gulps of liquids.  Avoid carbonated (bubbly/fizzy) drinks.    Avoid foods that make you feel gassy or bloated.  Start with bland foods first.  Wait on trying greasy, fried, or spicy meals until you are tolerating more bland solids well.  Understand that it will be hard to burp and belch at first.  This gradually improves with time.  Expect to be more gassy/flatulent/bloated initially.  Walking will help your body manage it better.  Consider using medications for bloating that contain simethicone such as  Maalox or Gas-X   Consider crushing her medications, especially smaller pills.  The ability to swallow pills should get easier after a few weeks  Eat in a relaxed atmosphere & minimize distractions.  Avoid talking while eating.    Do not use straws.  Following each meal, sit in an upright position (90 degree angle) for 60 to 90 minutes.  Going for a short walk can help as well  If food does stick, don't panic.  Try to relax and let the food pass on its own.    Sipping WARM LIQUID such as strong hot black tea can also help slide it down.   Be gradual in changes & use common sense:  -If you easily tolerating a certain "level" of foods, advance to the next level gradually -If you are having trouble swallowing a particular food, then avoid it.   -If food is sticking when you advance your diet, go back to thinner previous diet (the lower LEVEL) for 1-2 days.  LEVEL 1 = PUREED DIET  Do for the first 2 WEEKS AFTER SURGERY  -Foods in this group are  pureed or blenderized to a smooth, mashed potato-like consistency.  -If necessary, the pureed foods can keep their shape with the addition of a thickening agent.   -Meat should be pureed to a smooth, pasty consistency.  Hot broth or gravy may be added to the pureed meat, approximately 1 oz. of liquid per 3 oz. serving of meat. -CAUTION:  If any foods do not puree into a smooth consistency, swallowing will be more difficult.  (For example, nuts or seeds sometimes do not blend well.)  Hot Foods Cold Foods  Pureed scrambled eggs and cheese Pureed cottage cheese  Baby cereals Thickened juices and nectars  Thinned cooked cereals (no lumps) Thickened milk or eggnog  Pureed French toast or pancakes Ensure  Mashed potatoes Ice cream  Pureed parsley, au gratin, scalloped potatoes, candied sweet potatoes Fruit or Italian ice, sherbet  Pureed buttered or alfredo noodles Plain yogurt  Pureed vegetables (no corn or peas) Instant breakfast  Pureed soups and creamed soups Smooth pudding, mousse, custard  Pureed scalloped apples Whipped gelatin  Gravies Sugar, syrup, honey, jelly  Sauces, cheese, tomato, barbecue, white, creamed Cream  Any baby food Creamer  Alcohol in moderation (not beer or champagne) Margarine  Coffee or tea Mayonnaise   Ketchup, mustard   Apple sauce   SAMPLE MENU:  PUREED DIET Breakfast Lunch Dinner   Orange juice, 1/2 cup  Cream of wheat, 1/2 cup  Pineapple juice, 1/2 cup  Pureed turkey, barley soup, 3/4 cup  Pureed Hawaiian chicken, 3 oz   Scrambled eggs, mashed or blended with cheese, 1/2 cup  Tea or coffee, 1 cup   Whole milk, 1 cup   Non-dairy creamer, 2 Tbsp.  Mashed potatoes, 1/2 cup  Pureed cooled broccoli, 1/2 cup  Apple sauce, 1/2 cup  Coffee or tea  Mashed potatoes, 1/2 cup  Pureed spinach, 1/2 cup  Frozen yogurt, 1/2 cup  Tea or coffee      LEVEL 2 = SOFT DIET  After your first 2 weeks, you can advance to a soft diet.   Keep on this  diet until everything goes down easily.  Hot Foods Cold Foods  White fish Cottage cheese  Stuffed fish Junior baby fruit  Baby food meals Semi thickened juices  Minced soft cooked, scrambled, poached eggs nectars  Souffle & omelets Ripe mashed bananas  Cooked cereals Canned fruit, pineapple sauce, milk  potatoes Milkshake  Buttered or Alfredo noodles Custard  Cooked cooled vegetable Puddings, including tapioca  Sherbet Yogurt  Vegetable soup or alphabet soup Fruit ice, Italian ice  Gravies Whipped gelatin  Sugar, syrup, honey, jelly Junior baby desserts  Sauces:  Cheese, creamed, barbecue, tomato, white Cream  Coffee or tea Margarine   SAMPLE MENU:  LEVEL 2 Breakfast Lunch Dinner   Orange juice, 1/2 cup  Oatmeal, 1/2 cup  Scrambled eggs with cheese, 1/2 cup  Decaffeinated tea, 1 cup  Whole milk, 1 cup    Non-dairy creamer, 2 Tbsp  Pineapple juice, 1/2 cup  Minced beef, 3 oz  Gravy, 2 Tbsp  Mashed potatoes, 1/2 cup  Minced fresh broccoli, 1/2 cup  Applesauce, 1/2 cup  Coffee, 1 cup  Turkey, barley soup, 3/4 cup  Minced Hawaiian chicken, 3 oz  Mashed potatoes, 1/2 cup  Cooked spinach, 1/2 cup  Frozen yogurt, 1/2 cup  Non-dairy creamer, 2 Tbsp      LEVEL 3 = CHOPPED DIET  -After all the foods in level 2 (soft diet) are passing through well you should advance up to more chopped foods.  -It is still important to cut these foods into small pieces and eat slowly.  Hot Foods Cold Foods  Poultry Cottage cheese  Chopped Swedish meatballs Yogurt  Meat salads (ground or flaked meat) Milk  Flaked fish (tuna) Milkshakes  Poached or scrambled eggs Soft, cold, dry cereal  Souffles and omelets Fruit juices or nectars  Cooked cereals Chopped canned fruit  Chopped French toast or pancakes Canned fruit cocktail  Noodles or pasta (no rice) Pudding, mousse, custard  Cooked vegetables (no frozen peas, corn, or mixed vegetables) Green salad  Canned small sweet peas  Ice cream  Creamed soup or vegetable soup Fruit ice, Italian ice  Pureed vegetable soup or alphabet soup Non-dairy creamer  Ground scalloped apples Margarine  Gravies Mayonnaise  Sauces:  Cheese, creamed, barbecue, tomato, white Ketchup  Coffee or tea Mustard   SAMPLE MENU:  LEVEL 3 Breakfast Lunch Dinner   Orange juice, 1/2 cup  Oatmeal, 1/2 cup  Scrambled eggs with cheese, 1/2 cup  Decaffeinated tea, 1 cup  Whole milk, 1 cup  Non-dairy creamer, 2 Tbsp  Ketchup, 1 Tbsp  Margarine, 1 tsp  Salt, 1/4 tsp  Sugar, 2 tsp  Pineapple juice, 1/2 cup  Ground beef, 3 oz  Gravy, 2 Tbsp  Mashed potatoes, 1/2 cup  Cooked spinach, 1/2 cup  Applesauce, 1/2 cup  Decaffeinated coffee  Whole milk  Non-dairy creamer, 2 Tbsp  Margarine, 1 tsp  Salt, 1/4 tsp  Pureed turkey, barley soup, 3/4 cup  Barbecue chicken, 3 oz  Mashed potatoes, 1/2 cup  Ground fresh broccoli, 1/2 cup  Frozen yogurt, 1/2 cup  Decaffeinated tea, 1 cup  Non-dairy creamer, 2 Tbsp  Margarine, 1 tsp  Salt, 1/4 tsp  Sugar, 1 tsp    LEVEL 4:  REGULAR FOODS  -Foods in this group are soft, moist, regularly textured foods.   -This level includes meat and breads, which tend to be the hardest things to swallow.   -Eat very slowly, chew well and continue to avoid carbonated drinks. -most people are at this level in 4-6 weeks  Hot Foods Cold Foods  Baked fish or skinned Soft cheeses - cottage cheese  Souffles and omelets Cream cheese  Eggs Yogurt  Stuffed shells Milk  Spaghetti with meat sauce Milkshakes  Cooked cereal Cold dry cereals (no nuts, dried fruit, coconut)  French toast or pancakes Crackers  Buttered toast Fruit juices or nectars  Noodles or pasta (no rice) Canned fruit  Potatoes (all types) Ripe bananas  Soft, cooked vegetables (no corn, lima, or baked beans) Peeled, ripe, fresh fruit  Creamed soups or vegetable soup Cakes (no nuts, dried fruit, coconut)  Canned chicken  noodle soup Plain doughnuts  Gravies Ice cream  Bacon dressing Pudding, mousse, custard  Sauces:  Cheese, creamed, barbecue, tomato, white Fruit ice, Italian ice, sherbet  Decaffeinated tea or coffee Whipped gelatin  Pork chops Regular gelatin     Canned fruited gelatin molds   Sugar, syrup, honey, jam, jelly   Cream   Non-dairy   Margarine   Oil   Mayonnaise   Ketchup   Mustard   TROUBLESHOOTING IRREGULAR BOWELS  1) Avoid extremes of bowel movements (no bad constipation/diarrhea)  2) Miralax 17gm mixed in 8oz. water or juice-daily. May use BID as needed.  3) Gas-x,Phazyme, etc. as needed for gas & bloating.  4) Soft,bland diet. No spicy,greasy,fried foods.  5) Prilosec over-the-counter as needed  6) May hold gluten/wheat products from diet to see if symptoms improve.  7) May try probiotics (Align, Activa, etc) to help calm the bowels down  7) If symptoms become worse call back immediately.    If you have any questions please call our office at [facility]: [phone].  

## 2022-01-27 NOTE — Transfer of Care (Signed)
Immediate Anesthesia Transfer of Care Note  Patient: Deborah Farley  Procedure(s) Performed: ESOPHAGOGASTRODUODENOSCOPY (EGD) WITH BALLOON DILATION BALLOON DILATION KENALOG INJECTION  Patient Location: Endoscopy Unit  Anesthesia Type:MAC  Level of Consciousness: drowsy and patient cooperative  Airway & Oxygen Therapy: Patient Spontanous Breathing and Patient connected to face mask oxygen  Post-op Assessment: Report given to RN and Post -op Vital signs reviewed and stable  Post vital signs: Reviewed and stable  Last Vitals:  Vitals Value Taken Time  BP 93/50 01/27/22 1202  Temp    Pulse 59 01/27/22 1202  Resp 18 01/27/22 1202  SpO2 100 % 01/27/22 1202    Last Pain:  Vitals:   01/27/22 1038  TempSrc: Temporal  PainSc: 0-No pain         Complications: No notable events documented.

## 2022-01-27 NOTE — Op Note (Signed)
01/27/2022  12:01 PM  PATIENT:  Deborah Farley  76 y.o. female  Patient Care Team: Abigail Miyamoto, MD as PCP - General (Family Medicine) Revankar, Aundra Dubin, MD as PCP - Cardiology (Cardiology) Earvin Hansen, [facility] (Inactive) as Pharmacist (Pharmacist) Karie Soda, MD as Consulting Physician (General Surgery) Meryl Dare, MD as Consulting Physician (Gastroenterology) Revankar, Aundra Dubin, MD as Consulting Physician (Cardiology) Buck Mam, LCSW as Social Worker (Licensed Clinical Social Worker)  PRE-OPERATIVE DIAGNOSIS:  DYSPHAGIA AFTER FUNDOPLICATION  POST-OPERATIVE DIAGNOSIS:  DYSPHAGIA AFTER FUNDOPLICATION  PROCEDURE:   ESOPHAGOGASTRODUODENOSCOPY (EGD) WITH BALLOON DILATION BALLOON DILATION KENALOG INJECTION  SURGEON:  Ardeth Sportsman, MD  ASSISTANT: OR Staff   ANESTHESIA:   IV sedation  EBL:  No intake/output data recorded..  See anesthesia record  Delay start of Pharmacological VTE agent (>24hrs) due to surgical blood loss or risk of bleeding:  no  DRAINS: none   SPECIMEN:  No Specimen  DISPOSITION OF SPECIMEN:  N/A  COUNTS:  YES  PLAN OF CARE: Discharge to home after PACU  PATIENT DISPOSITION:  PACU - hemodynamically stable.  INDICATION: Pleasant woman with need for emergency hiatal hernia repair for incarcerated hiatal hernia.  9 x 8 cm defect repaired requiring diaphragmatic release and mesh reinforcement.  Phasix absorbable mesh.  She is 6 months postop with some persistent dysphagia that seems somewhat steroid responsive.  Offered EGD with possible dilation in hopes of stretching the region out and make sure there are no other issues.  The anatomy & physiology of the foregut and digestive tract was discussed.  Natural history risks without surgery was discussed.   The patient's situation is not adequately controlled by nonoperative interventions.  I feel the risks of no intervention will lead to serious problems that outweigh the operative  risks; therefore, I recommended endoscopy to help diagnose and possibly treat abnormalities in the foregut system.  Need for preprocedural workup to rule out the differential diagnosis and plan treatment was explained.    Risks such as bleeding, infection, abscess, leak, injury to other organs, need for repair of tissues / organs, need for further treatment, heart attack, death, and other risks were discussed.   I noted a good likelihood this will help address the problem.  Possible need for repeat dilations and endoscopy or endoscopic interventions discussed.  Possible need for surgical follow-up interventions goals of post-operative recovery were discussed as well.  Possibility that this will not correct all symptoms was explained.  We will work to minimize complications.   Educational handouts further explaining the pathology, treatment options, and dysphagia diet was given as well.  Questions were answered.  The patient expresses understanding & wishes to proceed.   OR FINDINGS: Omega shape wrap consistent with prior to Toupet fundoplication.  No evidence of any recurrent hiatal hernia.  No major stricture.  No erosion of mesh nor pledgets.  Balloon dilatation done 12mm to eventually 18 mm .  Kenalog 40 in 8mL - 1mL injections done in 4 quadrants at 2 levels around the region.   DESCRIPTION: Patient was identified in the holding area.  Patient underwent deep sedation with anesthesia.  Patient position left side down decubitus positioning with oral bite-block in place.  Timeout confirmed our plan.  Was able to pass the EGD endoscope transorally through into her oropharynx & hypopharynx.  Epiglottis and vocal cords intact without any obvious irritation.  No abnormalities in the vallecula.  I was able to pass into the esophagus without difficulty.  Esophagus was mildly patulous but intact.  Mild saliva noted in a few areas but no retained food.  At 40 cm could see tightening consistent with the wrap.   Continued down several centimeters and got into the stomach.  It is quite a J-shaped stomach.  No retained food.  No obvious ulceration or gastritis.  Confined to the pylorus which had a few dots of irritation but no major gastritis.  Intubated the pylorus to find a normal duodenal bulb and duodenal sweep underwhelming.  Retroflexion views noted a soft omega shaped type wrap consistent with her partial posterior toupet fundoplication.  Relatively snug.  No stricturing.  No recurrent hiatal hernia.  No erosion of mesh or pledgets.  No stitch exposed.  Focused on dilation.  We passed a balloon dilator and dilated across the fundoplication.  Started with 12 mm x 30 seconds, 13.5 cm x 30 seconds, 15 cm x 60 seconds.  Since that seem to go well, we upsized to the next balloon system.  15 cm x 30 seconds, 16.5 cm x 30 seconds, 18 cm x 120 seconds.  She tolerated that well.  Then Revestive in the stomach.  Saw no bleeding ulceration or perforation.  Treated with Kenalog injection using any and injecting needle.  Use Kenalog 40 dilated up to 8 mm.  Did injections in 4 quadrants around the fundoplication 2 cm apart from each other to have 2 rings along the 3.5 cm toupee wrap at the level of diaphragm.  Vance stomach into the stomach and liver no bleeding.  Aspirated excess gas and desufflated on the way out.  Patient tolerated well.  Return to the anesthesia team.  I discussed postoperative management with the patient.  She received IV Decadron dose as well.  We will start with clear liquids for the weekend and then advance to dysphagia 1 pured diet starting next week.  Refill of Zofran given as well.  Hopefully this will help her dysphagia resolved better.    I made an attempt to call the patient's husband 3 times to discuss the patient's overall status and my recommendations.  No one is available at this time.  My plans & instructions have been written in the chart.     Adin Hector, M.D.,  F.A.C.S. Gastrointestinal and Minimally Invasive Surgery [facility] [address], [place], [place] [address] [phone] Main / Paging

## 2022-01-27 NOTE — Anesthesia Postprocedure Evaluation (Signed)
Anesthesia Post Note  Patient: Deborah Farley  Procedure(s) Performed: ESOPHAGOGASTRODUODENOSCOPY (EGD) BALLOON DILATION KENALOG INJECTION     Patient location during evaluation: Endoscopy Anesthesia Type: MAC Level of consciousness: awake and alert Pain management: pain level controlled Vital Signs Assessment: post-procedure vital signs reviewed and stable Respiratory status: spontaneous breathing, nonlabored ventilation and respiratory function stable Cardiovascular status: stable and blood pressure returned to baseline Postop Assessment: no apparent nausea or vomiting Anesthetic complications: no   No notable events documented.  Last Vitals:  Vitals:   01/27/22 1220 01/27/22 1230  BP: (!) 141/68 (!) 147/83  Pulse: 62 61  Resp: 18 20  Temp:    SpO2: 93% 94%    Last Pain:  Vitals:   01/27/22 1230  TempSrc:   PainSc: 0-No pain                 Uziah Sorter

## 2022-01-27 NOTE — H&P (Signed)
01/27/2022   PROVIDER: Hollace Kinnier, MD  Patient Care Team: Raina Mina, MD as PCP - General (Internal Medicine) Estanislado Emms., MD (Gastroenterology) Jyl Heinz, MD (Cardiovascular Disease)  [facility] MRN: [id number] DOB: 09/26/1945 DATE OF ENCOUNTER: 01/27/2022   Interval History:   The patient returns to the office after undergoing URGENT robotic reduction and repair of hiatal hernia partial posterior fundoplication 0/53/9767  The patient returns with family.Marland Kitchen She was having some complaints of dysphagia and discomfort since her postop visit. We recommended an upper GI in follow-up. That study she delayed until last month. Apparently she has an allergy to injected iodine. Looks like they did a barium swallow without any allergy issues. Rescheduled office appointment several times.  Patient notes that she gets dysphagia to some solids. Sometimes even water. Some days are good but then yesterday she had 4 episodes. She is making urine. She is not lightheaded or dizzy. Her her weight is down 15 pounds from when I first met her. She thinks she is lost about 20 pounds. She continues using rolling walker. She is trying to push through her decreased appetite. She is not having severe nausea or retching. She recalls needing to be dilated a few times in the distant past. Not recently. She takes MiraLAX and Dulcolax to move her bowels once or twice a week.  Addendum: Patient had some improvement with steroid tapers but claims persistent dysphagia.  Sennett is been 6 months from surgery, I offered endoscopy with balloon dilatation and focal Kenalog injections.  Hopefully that will help improve things with 1-2 dilatations.  If persistent issue, may need to take back for partial takedown of wrap.  Try and hold off on that.  PRIOR NOTE 08/01/2021: Patient comes today. She stayed a few days extra given some mental confusion. Medical consultation. Some adjustment in her medication. She  normally takes Zoloft, trazodone, Xanax, etc. at home. She comes a using a rolling walker feeling much better. Her husband feels like she is already breathing better than before surgery which is a relief. She does get a little tired but is walking better now. She has mainly been using Tylenol. They note they never got a prescription for tramadol despite my orders. She says she has some leftover at home but has been hesitant to use it. He worries because she has some occasional chest discomfort and pulling at times. She does not feel like she needs to use it too much.  She remains on a count of pured diet. Has had some stickiness with solid foods. Not burping great but no hyper flatulence. Moving her bowels okay. Walking better. She is glad not to have any heartburn or reflux. In good spirits overall.    Labs, Imaging and Diagnostic Testing:  Located in [place]' section of Epic EMR chart  PRIOR [facility] CLINIC NOTES:  Located in [place]' section of Epic EMR chart  SURGERY NOTES:  Located in [place]' section of Epic EMR chart   POST-OPERATIVE DIAGNOSIS:  INCARCERATED PARAESOPHAGEAL HIATAL HERNIA  PROCEDURE:  1. ROBOTIC reduction of paraesophageal hiatal hernia 2. Type II mediastinal dissection. 3. Left diaphragm release 4. Primary repair of hiatal hernia over pledgets.  5. Anterior & posterior gastropexy. 6. Toupet (partial 270deg posterior) fundoplication x 4 cm 7. Mesh reinforcement with absorbable mesh  SURGEON: Adin Hector, MD  OR FINDINGS:   Moderate-sized paraesophageal hiatal hernia with mesenteric axial volvulus without obstruction or gangrene involving  100% of the stomach in the  mediastinum. There was a 9 x 8 cm hiatal defect.  Left sagittal diaphragmatic release made 5 cm from the left crus. It is a primary repair over pledgets. Mesh reinforcement was used with Mesh was used: PhasixT Mesh (a knitted monofilament mesh scaffold using  Poly-4-hydroxybutyrate (P4HB), a biologically derived, fully resorbable material)  The patient has a 4 cm Toupet fundoplication -posterior partial fundoplication done given her advanced age and obesity despite manometry showing no major dysmotility. The patient has had anterior and posterior gastropexy.  PATHOLOGY:  Located in [place]' section of Epic EMR chart  Physical Examination:   Body mass index is 35.48 kg/m.  Constitutional: Not cachectic. Hygeine adequate. Sitting on a rolling walker. Bright and alert. Not toxic. Not sickly. Eyes: Normal extraocular movements. Sclera nonicteric Neuro: No major focal sensory defects. No major motor deficits. Psych: No severe agitation. No severe anxiety. Judgment & insight Adequate, Oriented x4, HENT: Normocephalic, Mucus membranes moist. No thrush.  Neck: Supple, No tracheal deviation.  Chest: Good respiratory excursion. No audible wheezing CV: No major extremity edema Ext: No obvious deformity or contracture. Edema: not present. No cyanosis Skin: Warm and dry Musculoskeletal: Severe joint rigidity not present. Mobility: uses rolling walker moves with minimal assistance  Abdomen: Obese Hernia: Not present. Incisions Clean & dry with normal healing ridge Nontender. Diastasis recti: Not present. Soft. Nondistended.   Gen: Inguinal hernia: Not present. Inguinal lymph nodes: without lymphadenopathy.   Rectal: (Deferred)    Assessment and Plan:   Deborah Farley is a 76 y.o. female recovering s/p robotic reduction and repair of large incarcerated hiatal hernia with partial posterior fundoplication. 07/15/2021.  Diagnoses and all orders for this visit:  Dysphagia, unspecified type  History of repair of hiatal hernia  Incarcerated hiatal hernia  Organoaxial gastric volvulus  History of esophageal stricture  Other orders - predniSONE (DELTASONE) 5 MG tablet; Take 1 tablet (5 mg total) by mouth once daily Take 4 pills (49m) x  4 days, 3 pills (176m x 4 days, 2 pills (1072mx 1 week, 1 pill (5mg37m 1 week, then stop.  Persistent postop dysphagia seems mostly to solids to me in the absence of complete obstruction. Interesting no pill dysphagia.  EGD endoscopy with balloon dilatation across the wrap and perhaps the hiatal closure. Focused Kenalog injection at that level. I noted it is common to have some dysphagia especially after needing mesh reinforcement. However it is an absorbable Phasix mesh that usually melts away by 6 months. I would want to wait till then before doing any interventions. She does not have intractable dysphagia with dehydration or weight loss. It should improve. We will have to determine if Dr. StarFuller Planls comfortable doing on her for rather I would do it.  The anatomy & physiology of the foregut and digestive tract was discussed.  Natural history risks without surgery was discussed.   The patient's situation is not adequately controlled by nonoperative interventions.  I feel the risks of no intervention will lead to serious problems that outweigh the operative risks; therefore, I recommended endoscopy to help diagnose and possibly treat abnormalities in the foregut system.  Need for preprocedural workup to rule out the differential diagnosis and plan treatment was explained.    Risks such as bleeding, infection, abscess, leak, injury to other organs, need for repair of tissues / organs, need for further treatment, heart attack, death, and other risks were discussed.   I noted a good likelihood this will help address the problem.  Possible need for repeat dilations and endoscopy or endoscopic interventions discussed.  Possible need for surgical follow-up interventions goals of post-operative recovery were discussed as well.  Possibility that this will not correct all symptoms was explained.  We will work to minimize complications.   Educational handouts further explaining the pathology, treatment options, and  dysphagia diet was given as well.  Questions were answered.  The patient expresses understanding & wishes to proceed.   Adin Hector, MD, FACS, MASCRS Esophageal, Gastrointestinal & Colorectal Surgery Robotic and Minimally Invasive Surgery  [facility] [address] [place], [place] [address] [phone] Fax [phone] Main  CONTACT INFORMATION:  Weekday (9AM-5PM): Call [facility] main office at [phone]  Weeknight (5PM-9AM) or Weekend/Holiday: Check [email or web address] (password " [email or web address]") for General Surgery [facility] coverage  (Please, do not use SecureChat as it is not reliable communication to reach operating surgeons for immediate patient care given surgeries/outpatient duties/clinic/cross-coverage/off post-call which would lead to a delay in care.  Epic staff messaging available for outptient concerns, but may not be answered for 48 hours or more).    01/27/2022
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
## 2022-01-29 ENCOUNTER — Encounter (HOSPITAL_COMMUNITY): Payer: Self-pay | Admitting: Surgery

## 2022-02-27 ENCOUNTER — Telehealth: Payer: Self-pay | Admitting: *Deleted
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
## 2022-03-10 ENCOUNTER — Ambulatory Visit: Payer: Self-pay | Admitting: *Deleted
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
## 2022-03-17 ENCOUNTER — Ambulatory Visit: Payer: Self-pay | Admitting: *Deleted
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
## 2022-04-05 ENCOUNTER — Ambulatory Visit: Payer: Self-pay | Admitting: *Deleted
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
## 2022-04-20 ENCOUNTER — Ambulatory Visit: Payer: Self-pay | Admitting: *Deleted
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
## 2022-05-15 ENCOUNTER — Ambulatory Visit: Payer: Self-pay | Admitting: *Deleted
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
## 2022-06-08 ENCOUNTER — Ambulatory Visit: Payer: Self-pay | Admitting: *Deleted
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
## 2022-06-13 ENCOUNTER — Ambulatory Visit: Payer: Self-pay | Admitting: *Deleted
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
## 2022-06-16 ENCOUNTER — Encounter: Payer: Self-pay | Admitting: *Deleted

## 2022-06-26 ENCOUNTER — Other Ambulatory Visit: Payer: Self-pay

## 2022-06-28 ENCOUNTER — Ambulatory Visit: Payer: Self-pay | Admitting: *Deleted
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
## 2022-07-12 ENCOUNTER — Ambulatory Visit: Payer: Self-pay | Admitting: *Deleted

## 2022-07-12 NOTE — Patient Instructions (Signed)
Visit Information  Thank you for taking time to visit with me today. Please don't hesitate to contact me if I can be of assistance to you.   Following are the goals we discussed today:   Goals Addressed   None     Our next appointment is by telephone on 07/18/22   Please call the care guide team at [phone] if you need to cancel or reschedule your appointment.   If you are experiencing a Mental Health or [place] or need someone to talk to, please call the Suicide and Crisis Lifeline: 988 call the [place] National Suicide Prevention Lifeline: 438 453 1693 or TTY: 515-079-3371 TTY 336 457 7734) to talk to a trained counselor call 911   The patient verbalized understanding of instructions, educational materials, and care plan provided today and DECLINED offer to receive copy of patient instructions, educational materials, and care plan.   Telephone follow up appointment with care management team member scheduled for: 07/18/22  Eduard Clos, MSW, [place] Worker [employer] [phone]

## 2022-07-12 NOTE — Patient Outreach (Signed)
  Care Coordination   Follow Up Visit Note   07/12/2022 Name: Shealene Paro MRN: [id number] DOB: 10-13-1945  Jaide Deveaux is a 77 y.o. year old female who sees Lillard Anes, MD (Inactive) for primary care. I spoke with  Naomie Dean by phone today.  What matters to the patients health and wellness today?  "Coming home tomorrow to go with husband to get help- he is crying all the time"    Goals Addressed   None     SDOH assessments and interventions completed:  Yes     Care Coordination Interventions:  Yes, provided  Interventions Today    Flowsheet Row Most Recent Value  Mental Health Interventions   Mental Health Discussed/Reviewed Coping Strategies, Other  [emotional support provided in regards to family concerns: sister hospitalized (vent/chest tube) and husband (crying all the time/needs help)]       Follow up plan: Follow up call scheduled for 07/18/22    Encounter Outcome:  Pt. Visit Completed

## 2022-07-18 ENCOUNTER — Ambulatory Visit: Payer: Self-pay | Admitting: *Deleted

## 2022-07-19 NOTE — Patient Outreach (Signed)
  Care Coordination   Follow Up Visit Note   07/19/2022 Name: Deborah Farley MRN: [id number] DOB: [birth year]  Deborah Farley is a 77 y.o. year old female who sees Lillard Anes, MD (Inactive) for primary care. I spoke with  Naomie Dean by phone today.  What matters to the patients health and wellness today?  Came to     Goals Addressed               This Visit's Progress     Keep Myself Safe (pt-stated)         Activities and task to complete in order to accomplish goals.    Continue with compliance of taking medication prescribed by Doctor Self Support options  (remain safe and consider options with husband, housing, etc) Personal steps you want to take over the next few weeks Reconnect with resources provided [facility], counseling. Legal support)         SDOH assessments and interventions completed:  Yes     Care Coordination Interventions:  Yes, provided  ' Interventions Today    Flowsheet Row Most Recent Value  Mental Health Interventions   Mental Health Discussed/Reviewed Coping Strategies, Other  [continues to stay with family members to distance self from husband]  Safety Interventions   Safety Discussed/Reviewed Safety Discussed        Follow up plan: Follow up call scheduled for 08/04/22    Encounter Outcome:  Pt. Visit Completed

## 2022-07-19 NOTE — Patient Instructions (Signed)
Visit Information  Thank you for taking time to visit with me today. Please don't hesitate to contact me if I can be of assistance to you.   Following are the goals we discussed today:   Goals Addressed               This Visit's Progress     Keep Myself Safe (pt-stated)         Activities and task to complete in order to accomplish goals.    Continue with compliance of taking medication prescribed by Doctor Self Support options  (remain safe and consider options with husband, housing, etc) Personal steps you want to take over the next few weeks Reconnect with resources provided [facility], counseling. Legal support)         Our next appointment is by telephone on 08/04/22  Please call the care guide team at [phone] if you need to cancel or reschedule your appointment.   If you are experiencing a Mental Health or [place] or need someone to talk to, please call the Suicide and Crisis Lifeline: 988 call 911   The patient verbalized understanding of instructions, educational materials, and care plan provided today and DECLINED offer to receive copy of patient instructions, educational materials, and care plan.   Telephone follow up appointment with care management team member scheduled for:08/04/22  Eduard Clos, MSW, [place] Worker [employer] [phone]

## 2022-08-04 ENCOUNTER — Telehealth: Payer: Self-pay | Admitting: *Deleted

## 2022-08-04 ENCOUNTER — Encounter: Payer: Self-pay | Admitting: *Deleted

## 2022-08-04 NOTE — Patient Outreach (Signed)
  Care Coordination   08/04/2022 Name: Deborah Farley MRN: [id number] DOB: 1946/01/21   Care Coordination Outreach Attempts:  An unsuccessful telephone outreach was attempted today to offer the patient information about available care coordination services as a benefit of their health plan.   Follow Up Plan:  Additional outreach attempts will be made to offer the patient care coordination information and services.   Encounter Outcome:  No Answer   Care Coordination Interventions:  No, not indicated    Eduard Clos, MSW, [place] Worker [employer] [phone]

## 2022-09-01 ENCOUNTER — Telehealth: Payer: Self-pay | Admitting: Legal Medicine

## 2022-09-01 NOTE — Telephone Encounter (Signed)
PT WAS LAST SEEN BY DR. PERRY IN NOV. OF 2022 SHE WANTING TO SCHEDULE AN APPT FOR HERSELF AND FOR HER HUSBAND (Deborah Farley) WHICH WAS LAST SEEN BY REKA, NP IN DEC. OF 2023. I TOLD THE PT THAT THE SCHEDULE IS FULL THAT I WILL HAVE TO GET WITH A DOCTOR TO SEE WHERE THESE 2 APPT CAN SCHEDULED. SHE REQUESTED AN MD NOT ANY MIDLEVEL DUE TO THE LAST NP THEY SEEN WAS LIKE TALKING TO A WALL. PT ASKED WHEN WILL SHE RECEIVE A CALL BACK FROM THE OFFICE THAT HER HUSBAND HAS REACHED OUT AND HAS NEVER RECEIVED A CALL BACK. I TOLD HER THE OFFICE WAS CLOSED AT NOON TODAY THAT MOSTLY LIKELY THE DOCTOR WILL HAVE TO LOOK AT HER SCHEDULED AND WE WILL CONTACT HER NEXT WEEK.

## 2022-09-04 ENCOUNTER — Other Ambulatory Visit: Payer: Self-pay

## 2022-09-04 MED ORDER — METHOTREXATE SODIUM 2.5 MG PO TABS: ORAL_TABLET | ORAL | 0 refills | Status: DC

## 2022-09-04 NOTE — Telephone Encounter (Signed)
Scheduled both she and her husband. Dr. Sedalia Muta

## 2022-09-05 ENCOUNTER — Other Ambulatory Visit: Payer: Self-pay

## 2022-09-07 ENCOUNTER — Ambulatory Visit (INDEPENDENT_AMBULATORY_CARE_PROVIDER_SITE_OTHER): Payer: 59 | Admitting: Family Medicine

## 2022-09-07 ENCOUNTER — Encounter: Payer: Self-pay | Admitting: Family Medicine

## 2022-09-07 VITALS — BP 128/68 | HR 70 | Temp 96.8°F | Ht 63.0 in | Wt 148.0 lb

## 2022-09-07 DIAGNOSIS — Z6826 Body mass index (BMI) 26.0-26.9, adult: Secondary | ICD-10-CM

## 2022-09-07 DIAGNOSIS — F321 Major depressive disorder, single episode, moderate: Secondary | ICD-10-CM

## 2022-09-07 DIAGNOSIS — F332 Major depressive disorder, recurrent severe without psychotic features: Secondary | ICD-10-CM

## 2022-09-07 DIAGNOSIS — I5022 Chronic systolic (congestive) heart failure: Secondary | ICD-10-CM | POA: Diagnosis not present

## 2022-09-07 DIAGNOSIS — R0789 Other chest pain: Secondary | ICD-10-CM | POA: Diagnosis not present

## 2022-09-07 DIAGNOSIS — M0609 Rheumatoid arthritis without rheumatoid factor, multiple sites: Secondary | ICD-10-CM

## 2022-09-07 DIAGNOSIS — I10 Essential (primary) hypertension: Secondary | ICD-10-CM | POA: Diagnosis not present

## 2022-09-07 DIAGNOSIS — E039 Hypothyroidism, unspecified: Secondary | ICD-10-CM

## 2022-09-07 DIAGNOSIS — R7303 Prediabetes: Secondary | ICD-10-CM

## 2022-09-07 MED ORDER — VENLAFAXINE HCL ER 75 MG PO CP24: 75.0000 mg | ORAL_CAPSULE | Freq: Every day | ORAL | 0 refills | Status: DC

## 2022-09-07 NOTE — Assessment & Plan Note (Deleted)
Discussed the need for in house treatment. Patient refused to have Daughter or EMS to admit her to the hospital for Psychiatry.
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
## 2022-09-07 NOTE — Assessment & Plan Note (Addendum)
Well controlled.  Medications: Diltiazem 120 mg, Metoprolol 25 mg 2 times daily. Bumex as needed for swelling. No medication changes recommended. Continue healthy diet and exercise.

## 2022-09-07 NOTE — Patient Instructions (Signed)
Stop sertraline.  Start venlafaxine er 75 mg once daily in am.  Continue wellbutrin xl at current dose in am.
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
## 2022-09-08 ENCOUNTER — Other Ambulatory Visit: Payer: Self-pay

## 2022-09-08 ENCOUNTER — Other Ambulatory Visit: Payer: Self-pay | Admitting: Family Medicine

## 2022-09-08 ENCOUNTER — Telehealth: Payer: Self-pay

## 2022-09-08 LAB — CBC WITH DIFFERENTIAL/PLATELET
Basophils Absolute: 0.1 10*3/uL (ref 0.0–0.2)
Basos: 1 %
EOS (ABSOLUTE): 0.1 10*3/uL (ref 0.0–0.4)
Eos: 2 %
Hematocrit: 39.3 % (ref 34.0–46.6)
Hemoglobin: 13.8 g/dL (ref 11.1–15.9)
Immature Grans (Abs): 0 10*3/uL (ref 0.0–0.1)
Immature Granulocytes: 0 %
Lymphocytes Absolute: 2.4 10*3/uL (ref 0.7–3.1)
Lymphs: 42 %
MCH: 34.5 pg — ABNORMAL HIGH (ref 26.6–33.0)
MCHC: 35.1 g/dL (ref 31.5–35.7)
MCV: 98 fL — ABNORMAL HIGH (ref 79–97)
Monocytes Absolute: 0.4 10*3/uL (ref 0.1–0.9)
Monocytes: 8 %
Neutrophils Absolute: 2.7 10*3/uL (ref 1.4–7.0)
Neutrophils: 47 %
Platelets: 133 10*3/uL — ABNORMAL LOW (ref 150–450)
RBC: 4 x10E6/uL (ref 3.77–5.28)
RDW: 15 % (ref 11.7–15.4)
WBC: 5.7 10*3/uL (ref 3.4–10.8)

## 2022-09-08 LAB — COMPREHENSIVE METABOLIC PANEL
ALT: 29 IU/L (ref 0–32)
AST: 52 IU/L — ABNORMAL HIGH (ref 0–40)
Albumin/Globulin Ratio: 1.3 (ref 1.2–2.2)
Albumin: 3.7 g/dL — ABNORMAL LOW (ref 3.8–4.8)
Alkaline Phosphatase: 173 IU/L — ABNORMAL HIGH (ref 44–121)
BUN/Creatinine Ratio: 10 — ABNORMAL LOW (ref 12–28)
BUN: 8 mg/dL (ref 8–27)
Bilirubin Total: 0.9 mg/dL (ref 0.0–1.2)
CO2: 23 mmol/L (ref 20–29)
Calcium: 9.1 mg/dL (ref 8.7–10.3)
Chloride: 107 mmol/L — ABNORMAL HIGH (ref 96–106)
Creatinine, Ser: 0.79 mg/dL (ref 0.57–1.00)
Globulin, Total: 2.8 g/dL (ref 1.5–4.5)
Glucose: 91 mg/dL (ref 70–99)
Potassium: 4.6 mmol/L (ref 3.5–5.2)
Sodium: 143 mmol/L (ref 134–144)
Total Protein: 6.5 g/dL (ref 6.0–8.5)
eGFR: 77 mL/min/{1.73_m2} (ref >59)

## 2022-09-08 LAB — LIPID PANEL
Chol/HDL Ratio: 2.6 ratio (ref 0.0–4.4)
Cholesterol, Total: 123 mg/dL (ref 100–199)
HDL: 48 mg/dL (ref >39)
LDL Chol Calc (NIH): 58 mg/dL (ref 0–99)
Triglycerides: 85 mg/dL (ref 0–149)
VLDL Cholesterol Cal: 17 mg/dL (ref 5–40)

## 2022-09-08 LAB — T4, FREE: Free T4: 0.89 ng/dL (ref 0.82–1.77)

## 2022-09-08 LAB — HEMOGLOBIN A1C
Est. average glucose Bld gHb Est-mCnc: 111 mg/dL
Hgb A1c MFr Bld: 5.5 % (ref 4.8–5.6)

## 2022-09-08 LAB — TSH: TSH: 3.74 u[IU]/mL (ref 0.450–4.500)

## 2022-09-08 LAB — CARDIOVASCULAR RISK ASSESSMENT

## 2022-09-08 NOTE — Telephone Encounter (Signed)
Called patient to check on her, states she was unable to go by Dr. Harlin Heys office after she left our office yesterday. Stated she plans to contact her office today. Reports after leaving our office she went to get something to eat, had a syncopal episode and was seen at the [facility] ED. Aware to contact our office with any issues and to notify us of her appt with Dr. Luther Hearing.

## 2022-09-10 ENCOUNTER — Encounter: Payer: Self-pay | Admitting: Family Medicine

## 2022-09-10 DIAGNOSIS — R7303 Prediabetes: Secondary | ICD-10-CM | POA: Insufficient documentation

## 2022-09-10 NOTE — Assessment & Plan Note (Signed)
Previously well controlled Continue Synthroid at current dose  Recheck TSH and adjust Synthroid as indicated   

## 2022-09-10 NOTE — Assessment & Plan Note (Signed)
Check a1c 

## 2022-09-10 NOTE — Assessment & Plan Note (Signed)
Check cmp, cbc.  On mtx.  Will need to return to rheumatology.

## 2022-09-10 NOTE — Assessment & Plan Note (Signed)
The current medical regimen is effective;  continue present plan and medications. Continue Diltiazem 120 mg, Metoprolol 25 mg 2 times daily. Bumex as needed for swelling.

## 2022-09-10 NOTE — Assessment & Plan Note (Signed)
EKG: NSR. No ST changes. 

## 2022-09-11 ENCOUNTER — Ambulatory Visit: Payer: Self-pay | Admitting: *Deleted

## 2022-09-11 NOTE — Patient Instructions (Signed)
Visit Information  Thank you for taking time to visit with me today. Please don't hesitate to contact me if I can be of assistance to you.   Following are the goals we discussed today:   Goals Addressed               This Visit's Progress     Keep Myself Safe (pt-stated)         Activities and task to complete in order to accomplish goals.    Glad you are remaining safe and away from your home Glad you were able to visit your sister in [place]/hospice care Discuss your diabetes further with PCP after your medical event Friday  Self Support options  (remain safe and consider options with husband, housing, etc) Personal steps you want to take over the next few weeks Reconnect with resources provided [facility], counseling. Legal support)         Our next appointment is by telephone on 09/25/22  Please call the care guide team at [phone] if you need to cancel or reschedule your appointment.   If you are experiencing a Mental Health or Behavioral Health Crisis or need someone to talk to, please call the Suicide and Crisis Lifeline: 988 call 911   The patient verbalized understanding of instructions, educational materials, and care plan provided today and DECLINED offer to receive copy of patient instructions, educational materials, and care plan.   Telephone follow up appointment with care management team member scheduled for: 09/25/22  Reece Levy, MSW, LCSW Clinical Social Worker [employer] [phone]

## 2022-09-11 NOTE — Patient Outreach (Addendum)
  Care Coordination   Follow Up Visit Note   09/11/2022 Name: Deborah Farley MRN: [id number] DOB: 1945-06-07  Deborah Farley is a 77 y.o. year old female who sees Cox, Kirsten, MD for primary care. I spoke with  [name] by phone today.  What matters to the patients health and wellness today?  Remaining safe- focused on health/mental health wellness.     Goals Addressed               This Visit's Progress     Keep Myself Safe (pt-stated)         Activities and task to complete in order to accomplish goals.    Glad you are remaining safe and away from your home Glad you were able to visit your sister in [place]/hospice care Discuss your diabetes further with PCP after your medical event Friday  Self Support options  (remain safe and consider options with husband, housing, etc) Personal steps you want to take over the next few weeks Continue to assess and consider long term plans with marriage, housing,etc Reconnect with resources provided [facility], counseling. Legal support)         SDOH assessments and interventions completed:  Yes     Care Coordination Interventions:  Yes, provided  Interventions Today    Flowsheet Row Most Recent Value  Chronic Disease   Chronic disease during today's visit Diabetes  [Pt shared she "passed  out" in the parking lot of [employer] after fasting labs were taken. Went by EMS to hospital. Wants to talk to PCP about DEXCOM]  General Interventions   General Interventions Discussed/Reviewed General Interventions Discussed  Mental Health Interventions   Mental Health Discussed/Reviewed Mental Health Discussed, Coping Strategies, Other  [Pt reports she is still not back at her home- staying with her granddaughter near [place] and drove to [place] to visit her sister (in hospice care)]       Follow up plan: Follow up call scheduled for 09/25/22    Encounter Outcome:  Pt. Visit Completed

## 2022-09-13 ENCOUNTER — Ambulatory Visit: Payer: 59 | Attending: Cardiology | Admitting: Cardiology

## 2022-09-13 ENCOUNTER — Encounter: Payer: Self-pay | Admitting: Cardiology

## 2022-09-13 ENCOUNTER — Ambulatory Visit (INDEPENDENT_AMBULATORY_CARE_PROVIDER_SITE_OTHER): Payer: 59 | Admitting: Family Medicine

## 2022-09-13 ENCOUNTER — Ambulatory Visit: Payer: 59 | Attending: Cardiology

## 2022-09-13 VITALS — BP 104/60 | HR 65 | Temp 97.2°F | Ht 63.0 in | Wt 158.0 lb

## 2022-09-13 VITALS — BP 134/70 | HR 62 | Ht 63.0 in | Wt 156.0 lb

## 2022-09-13 DIAGNOSIS — I2510 Atherosclerotic heart disease of native coronary artery without angina pectoris: Secondary | ICD-10-CM

## 2022-09-13 DIAGNOSIS — I152 Hypertension secondary to endocrine disorders: Secondary | ICD-10-CM

## 2022-09-13 DIAGNOSIS — R55 Syncope and collapse: Secondary | ICD-10-CM

## 2022-09-13 DIAGNOSIS — E782 Mixed hyperlipidemia: Secondary | ICD-10-CM | POA: Diagnosis not present

## 2022-09-13 DIAGNOSIS — E039 Hypothyroidism, unspecified: Secondary | ICD-10-CM

## 2022-09-13 DIAGNOSIS — I10 Essential (primary) hypertension: Secondary | ICD-10-CM

## 2022-09-13 DIAGNOSIS — M0609 Rheumatoid arthritis without rheumatoid factor, multiple sites: Secondary | ICD-10-CM

## 2022-09-13 DIAGNOSIS — E669 Obesity, unspecified: Secondary | ICD-10-CM

## 2022-09-13 DIAGNOSIS — R300 Dysuria: Secondary | ICD-10-CM | POA: Diagnosis not present

## 2022-09-13 DIAGNOSIS — E1169 Type 2 diabetes mellitus with other specified complication: Secondary | ICD-10-CM

## 2022-09-13 DIAGNOSIS — I5022 Chronic systolic (congestive) heart failure: Secondary | ICD-10-CM

## 2022-09-13 DIAGNOSIS — F332 Major depressive disorder, recurrent severe without psychotic features: Secondary | ICD-10-CM | POA: Diagnosis not present

## 2022-09-13 DIAGNOSIS — E1159 Type 2 diabetes mellitus with other circulatory complications: Secondary | ICD-10-CM

## 2022-09-13 LAB — POCT URINALYSIS DIP (CLINITEK)
Blood, UA: NEGATIVE
Glucose, UA: NEGATIVE mg/dL
Nitrite, UA: NEGATIVE
Spec Grav, UA: >=1.03 — AB (ref 1.010–1.025)
Urobilinogen, UA: 1 E.U./dL
pH, UA: 6 (ref 5.0–8.0)

## 2022-09-13 MED ORDER — TRAMADOL HCL 50 MG PO TABS: 50.0000 mg | ORAL_TABLET | Freq: Four times a day (QID) | ORAL | 0 refills | Status: DC | PRN

## 2022-09-13 MED ORDER — BUPROPION HCL ER (XL) 300 MG PO TB24: 300.0000 mg | ORAL_TABLET | Freq: Every day | ORAL | 1 refills | Status: DC

## 2022-09-13 MED ORDER — ALPRAZOLAM 0.5 MG PO TABS: 0.2500 mg | ORAL_TABLET | Freq: Every day | ORAL | 1 refills | Status: DC | PRN

## 2022-09-13 MED ORDER — ONDANSETRON 4 MG PO TBDP: 4.0000 mg | ORAL_TABLET | Freq: Three times a day (TID) | ORAL | 1 refills | Status: DC | PRN

## 2022-09-13 MED ORDER — BUMETANIDE 1 MG PO TABS: 1.0000 mg | ORAL_TABLET | Freq: Every day | ORAL | 1 refills | Status: DC | PRN

## 2022-09-13 MED ORDER — DILTIAZEM HCL ER COATED BEADS 120 MG PO CP24: 120.0000 mg | ORAL_CAPSULE | Freq: Every day | ORAL | 1 refills | Status: DC

## 2022-09-13 MED ORDER — LEVOTHYROXINE SODIUM 25 MCG PO TABS: 25.0000 ug | ORAL_TABLET | Freq: Every day | ORAL | 1 refills | Status: DC

## 2022-09-13 MED ORDER — NITROFURANTOIN MONOHYD MACRO 100 MG PO CAPS: 100.0000 mg | ORAL_CAPSULE | Freq: Two times a day (BID) | ORAL | 0 refills | Status: DC

## 2022-09-13 MED ORDER — TRAZODONE HCL 50 MG PO TABS: 50.0000 mg | ORAL_TABLET | Freq: Every day | ORAL | 0 refills | Status: DC

## 2022-09-13 MED ORDER — METHOTREXATE SODIUM 2.5 MG PO TABS: ORAL_TABLET | ORAL | 0 refills | Status: DC

## 2022-09-13 MED ORDER — METOPROLOL TARTRATE 25 MG PO TABS: 25.0000 mg | ORAL_TABLET | Freq: Two times a day (BID) | ORAL | 0 refills | Status: DC

## 2022-09-13 NOTE — Patient Instructions (Signed)
Medication Instructions:  Your physician recommends that you continue on your current medications as directed. Please refer to the Current Medication list given to you today.  *If you need a refill on your cardiac medications before your next appointment, please call your pharmacy*   Lab Work: None ordered If you have labs (blood work) drawn today and your tests are completely normal, you will receive your results only by: MyChart Message (if you have MyChart) OR A paper copy in the mail If you have any lab test that is abnormal or we need to change your treatment, we will call you to review the results.   Testing/Procedures: None ordered   Follow-Up: At [facility], you and your health needs are our priority.  As part of our continuing mission to provide you with exceptional heart care, we have created designated Provider Care Teams.  These Care Teams include your primary Cardiologist (physician) and Advanced Practice Providers (APPs -  Physician Assistants and Nurse Practitioners) who all work together to provide you with the care you need, when you need it.  We recommend signing up for the patient portal called "MyChart".  Sign up information is provided on this After Visit Summary.  MyChart is used to connect with patients for Virtual Visits (Telemedicine).  Patients are able to view lab/test results, encounter notes, upcoming appointments, etc.  Non-urgent messages can be sent to your provider as well.   To learn more about what you can do with MyChart, go to [email or web address].[email or web address].    Your next appointment:   12 month(s)  The format for your next appointment:   In Person  Provider:   Rajan Revankar, MD    Other Instructions none  Important Information About Sugar      

## 2022-09-13 NOTE — Progress Notes (Signed)
Cardiology Office Note:    Date:  09/13/2022   ID:  Deborah Farley, DOB 1946/04/13, MRN [id number]  PCP:  Blane Ohara, MD  Cardiologist:  Garwin Brothers, MD   Referring MD: Blane Ohara, MD    ASSESSMENT:    1. Mild CAD   2. Primary hypertension   3. Mixed hyperlipidemia    PLAN:    In order of problems listed above:  Primary prevention stressed with the patient.  Importance of compliance with diet medication stressed and patient verbalized standing. Myocardial bridging: Stable at this time.  Asymptomatic. Essential hypertension: Blood pressure stable and diet was emphasized. Mixed dyslipidemia: On lipid-lowering medications followed by primary care. Syncope: Most likely secondary to low blood sugar.  I reviewed [facility]'s records extensively and discussed with her.  In view of syncope she was advised to not drive till cleared by primary care.  Will also do a 1 month monitor to assess any rhythm issues. LFT elevation: At [facility].  This will be managed by primary care. Patient will be seen in follow-up appointment in 6 months or earlier if the patient has any concerns.    Medication Adjustments/Labs and Tests Ordered: Current medicines are reviewed at length with the patient today.  Concerns regarding medicines are outlined above.  No orders of the defined types were placed in this encounter.  No orders of the defined types were placed in this encounter.    No chief complaint on file.    History of Present Illness:    Deborah Farley is a 77 y.o. female.  Patient has past medical history of myocardial bridging, essential hypertension, mixed dyslipidemia.  She has undergone hiatal hernia surgery and is doing well.  She denies any chest pain orthopnea or PND.  She ambulates well without any symptoms.  Recently she passed out and was taken to the  hospital.  That evaluation was unremarkable.  Will try to obtain those records.  She was told that she  had low blood sugar and therefore she passed out.  At the time of my evaluation, the patient is alert awake oriented and in no distress.  Past Medical History:  Diagnosis Date   Acid reflux 02/29/2016   Acute respiratory failure with hypoxia (HCC) 07/19/2021   Acute sinusitis 03/19/2020   Allergy    Chronic idiopathic constipation 07/02/2015   Chronic pain syndrome 11/30/2020   Chronic systolic congestive heart failure (HCC)    Constipation 07/19/2021   Coronary artery disease involving native coronary artery of native heart with unstable angina pectoris (HCC) 07/02/2015   Formatting of this note might be different from the original. [place]   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   Degeneration of lumbar intervertebral disc 03/02/2021   Depression    Depression, major, single episode, moderate (HCC) 08/31/2020   Diet-controlled diabetes mellitus (HCC)    Dyspnea on exertion 01/16/2019   Epigastric pain 04/08/2015   Gait disturbance 03/15/2017   Heart disease    Hiatal hernia 07/02/2015   High risk medication use 04/12/2016   Methotrexate PLQ Eye Exam: 07/28/16 WNL @ [facility] Follow up in 6 months.    History of diabetes mellitus 05/31/2016   Hyperlipidemia 02/29/2016   Hypertension    Hypokalemia 07/19/2021   Hypothyroidism    Hypoxia 07/18/2021   Incarcerated hiatal hernia s/p robotic repair 07/15/2021 07/15/2021   Left-sided chest pain 07/19/2021   Major depressive disorder, recurrent episode, moderate (HCC) 07/02/2015   Malaise and fatigue 07/02/2015   Memory difficulty  Mild protein-calorie malnutrition (HCC) 07/18/2021   Nausea and vomiting 05/17/2021   Obesity (BMI 35.0-39.9 without comorbidity) 02/29/2016   Obesity, diabetes, and hypertension syndrome (HCC)    Organoaxial gastric volvulus 05/17/2021   Pleural effusion on left 07/19/2021   Pre-diabetes    Progressive angina (HCC) 08/11/2015   PTSD (post-traumatic stress disorder) 11/30/2020   Rheumatoid arthritis  (HCC) 04/12/2016   Spastic esophagus    Suspected pulmonary embolism 03/11/2019   Thrombocytopenia (HCC) 07/18/2021   Transaminitis 07/18/2021    Past Surgical History:  Procedure Laterality Date   ABDOMINAL HYSTERECTOMY     BACK SURGERY     BALLOON DILATION  01/27/2022   Procedure: BALLOON DILATION;  Surgeon: Karie Soda, MD;  Location: [facility];  Service: General;;   CARDIAC CATHETERIZATION N/A 08/12/2015   Procedure: Left Heart Cath and Coronary Angiography;  Surgeon: Yates Decamp, MD;  Location: [facility] INVASIVE CV LAB;  Service: Cardiovascular;  Laterality: N/A;   ESOPHAGEAL MANOMETRY N/A 07/01/2021   Procedure: ESOPHAGEAL MANOMETRY (EM);  Surgeon: Meryl Dare, MD;  Location: [facility];  Service: Endoscopy;  Laterality: N/A;   ESOPHAGOGASTRODUODENOSCOPY N/A 01/27/2022   Procedure: ESOPHAGOGASTRODUODENOSCOPY (EGD);  Surgeon: Karie Soda, MD;  Location: [facility];  Service: General;  Laterality: N/A;   KENALOG INJECTION  01/27/2022   Procedure: Pauline Good INJECTION;  Surgeon: Karie Soda, MD;  Location: [facility];  Service: General;;   LEFT HEART CATH AND CORONARY ANGIOGRAPHY N/A 02/20/2018   Procedure: LEFT HEART CATH AND CORONARY ANGIOGRAPHY;  Surgeon: Lennette Bihari, MD;  Location: [facility] INVASIVE CV LAB;  Service: Cardiovascular;  Laterality: N/A;   LEFT HEART CATH AND CORONARY ANGIOGRAPHY N/A 01/19/2020   Procedure: LEFT HEART CATH AND CORONARY ANGIOGRAPHY;  Surgeon: Marykay Lex, MD;  Location: [facility]VASIVE CV LAB;  Service: Cardiovascular;  Laterality: N/A;   TOTAL ABDOMINAL HYSTERECTOMY W/ BILATERAL SALPINGOOPHORECTOMY     XI ROBOTIC ASSISTED HIATAL HERNIA REPAIR N/A 07/15/2021   Procedure: robotic paraesophageal hiatal hernia with fundoplication, diaphragm release and mesh repair and bilateral TAP block;  Surgeon: Karie Soda, MD;  Location: [facility];  Service: General;  Laterality: N/A;    Current Medications: Current Meds  Medication Sig   ALPRAZolam (XANAX) 0.5 MG  tablet Take 0.5 tablets (0.25 mg total) by mouth daily as needed for anxiety.   bumetanide (BUMEX) 1 MG tablet Take 1 tablet (1 mg total) by mouth daily as needed (swelling).   buPROPion (WELLBUTRIN XL) 300 MG 24 hr tablet Take 1 tablet by mouth at bedtime.   diltiazem (CARDIZEM CD) 120 MG 24 hr capsule Take 1 capsule (120 mg total) by mouth daily.   latanoprost (XALATAN) 0.005 % ophthalmic solution Place 1 drop into both eyes at bedtime.   levothyroxine (SYNTHROID) 25 MCG tablet Take 1 tablet (25 mcg total) by mouth daily before breakfast.   methotrexate (RHEUMATREX) 2.5 MG tablet Take 10 tablets by mouth every Sunday. **Caution: Chemotherapy. Protect from light.**   metoprolol tartrate (LOPRESSOR) 25 MG tablet Take 1 tablet (25 mg total) by mouth 2 (two) times daily.   nitroGLYCERIN (NITROSTAT) 0.4 MG SL tablet Place 1 tablet (0.4 mg total) under the tongue every 5 (five) minutes as needed for chest pain.   ondansetron (ZOFRAN-ODT) 4 MG disintegrating tablet Take 1 tablet (4 mg total) by mouth every 8 (eight) hours as needed for vomiting or nausea.   polyethylene glycol (MIRALAX / GLYCOLAX) 17 g packet Take 17 g by mouth daily as needed for mild constipation.   pravastatin (  PRAVACHOL) 20 MG tablet Take 1 tablet by mouth daily.   traMADol (ULTRAM) 50 MG tablet Take 1 tablet (50 mg total) by mouth every 6 (six) hours as needed.   traZODone (DESYREL) 50 MG tablet Take 1 tablet by mouth at bedtime.   venlafaxine XR (EFFEXOR XR) 75 MG 24 hr capsule Take 1 capsule (75 mg total) by mouth daily with breakfast.     Allergies:   Codeine, Iodine, and Shellfish allergy   Social History   Socioeconomic History   Marital status: Married    Spouse name: Aftan Pliler   Number of children: 2   Years of education: 12   Highest education level: Associate degree: occupational, Scientist, product/process development, or vocational program  Occupational History   Occupation: retired  Tobacco Use   Smoking status: Never    Passive  exposure: Never   Smokeless tobacco: Never  Vaping Use   Vaping Use: Never used  Substance and Sexual Activity   Alcohol use: No   Drug use: No   Sexual activity: Yes    Partners: Male  Other Topics Concern   Not on file  Social History Narrative   Not on file   Social Determinants of Health   Financial Resource Strain: High Risk (09/11/2022)   Overall Financial Resource Strain (CARDIA)    Difficulty of Paying Living Expenses: Very hard  Food Insecurity: Food Insecurity Present (09/11/2022)   Hunger Vital Sign    Worried About Running Out of Food in the Last Year: Sometimes true    Ran Out of Food in the Last Year: Sometimes true  Transportation Needs: No Transportation Needs (09/11/2022)   PRAPARE - Administrator, Civil Service (Medical): No    Lack of Transportation (Non-Medical): No  Physical Activity: Unknown (09/11/2022)   Exercise Vital Sign    Days of Exercise per Week: 0 days    Minutes of Exercise per Session: Not on file  Stress: Stress Concern Present (09/11/2022)   [employer] of Occupational Health - Occupational Stress Questionnaire    Feeling of Stress : Very much  Social Connections: Moderately Isolated (09/11/2022)   Social Connection and Isolation Panel [NHANES]    Frequency of Communication with Friends and Family: More than three times a week    Frequency of Social Gatherings with Friends and Family: Once a week    Attends Religious Services: More than 4 times per year    Active Member of [employer] or Organizations: No    Attends Engineer, structural: Not on file    Marital Status: Separated     Family History: The patient's family history includes Diabetes in her maternal aunt, maternal grandmother, mother, and sister; Heart attack (age of onset: 21) in her father; Heart disease in her father and mother; Hypertension in her mother and sister; Kidney disease in her daughter; Other in her son. There is no history of Breast cancer.  ROS:    Please see the history of present illness.    All other systems reviewed and are negative.  EKGs/Labs/Other Studies Reviewed:    The following studies were reviewed today: EKG reveals sinus rhythm with nonspecific ST-T changes   Recent Labs: 09/07/2022: ALT 29; BUN 8; Creatinine, Ser 0.79; Hemoglobin 13.8; Platelets 133; Potassium 4.6; Sodium 143; TSH 3.740  Recent Lipid Panel    Component Value Date/Time   CHOL 123 09/07/2022 1424   TRIG 85 09/07/2022 1424   HDL 48 09/07/2022 1424   CHOLHDL 2.6 09/07/2022 1424  LDLCALC 58 09/07/2022 1424    Physical Exam:    VS:  BP 134/70   Pulse 62   Ht 5\' 3"  (1.6 m)   Wt 156 lb (70.8 kg)   SpO2 95%   BMI 27.63 kg/m     Wt Readings from Last 3 Encounters:  09/13/22 156 lb (70.8 kg)  09/07/22 148 lb (67.1 kg)  01/24/22 195 lb (88.5 kg)     GEN: Patient is in no acute distress HEENT: Normal NECK: No JVD; No carotid bruits LYMPHATICS: No lymphadenopathy CARDIAC: Hear sounds regular, 2/6 systolic murmur at the apex. RESPIRATORY:  Clear to auscultation without rales, wheezing or rhonchi  ABDOMEN: Soft, non-tender, non-distended MUSCULOSKELETAL:  No edema; No deformity  SKIN: Warm and dry NEUROLOGIC:  Alert and oriented x 3 PSYCHIATRIC:  Normal affect   Signed, Garwin Brothers, MD  09/13/2022 10:18 AM    [facility]
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
## 2022-09-15 LAB — URINE CULTURE

## 2022-09-17 ENCOUNTER — Encounter: Payer: Self-pay | Admitting: Family Medicine

## 2022-09-17 DIAGNOSIS — R300 Dysuria: Secondary | ICD-10-CM | POA: Insufficient documentation

## 2022-09-17 NOTE — Assessment & Plan Note (Signed)
The current medical regimen is effective;  continue present plan and medications.  

## 2022-09-17 NOTE — Assessment & Plan Note (Signed)
Check ua

## 2022-09-17 NOTE — Assessment & Plan Note (Signed)
Well controlled.  °No changes to medicines. Continue pravastatin. °Continue to work on eating a healthy diet and exercise.  °Labs drawn today.  ° °

## 2022-09-17 NOTE — Assessment & Plan Note (Signed)
Well controlled.  ?No changes to medicines.  ?Continue to work on eating a healthy diet and exercise.  ?Labs drawn today.  ?

## 2022-09-17 NOTE — Assessment & Plan Note (Signed)
On mtx.  Follow up with rheumatology.

## 2022-09-21 ENCOUNTER — Telehealth: Payer: Self-pay

## 2022-09-21 NOTE — Telephone Encounter (Addendum)
Left detail message for patient.  ----- Message from Blane Ohara, MD sent at 09/17/2022 10:25 PM EDT ----- Regarding: abx. Complete macrobid.  ----- Message ----- From: Eugenie Norrie, CMA Sent: 09/13/2022   4:59 PM EDT To: Blane Ohara, MD

## 2022-09-24 NOTE — Assessment & Plan Note (Signed)
Improved.  Contracts for safety. Continue Effexor XR 75 mg every morning. Continue Wellbutrin 300 mg every morning. Continue Xanax 0.5 mg daily as needed severe anxiety. Continue trazodone 50 mg 1 p.o. nightly for sleep.

## 2022-09-25 ENCOUNTER — Ambulatory Visit: Payer: Self-pay | Admitting: *Deleted

## 2022-09-25 NOTE — Patient Instructions (Signed)
Visit Information  Thank you for taking time to visit with me today. Please don't hesitate to contact me if I can be of assistance to you.   Following are the goals we discussed today:   Goals Addressed               This Visit's Progress     Keep Myself Safe (pt-stated)         Activities and task to complete in order to accomplish goals.    Glad you are remaining safe and away from your home- glad you can stay with other family Glad you were able to visit your sister in [place]/hospice care Continue to take medications as prescribed- glad the new RX that Dr Sedalia Muta began seems to be working well! Consider legal consultation as we discussed-  Self Support options  (remain safe and consider options with husband, housing, etc) Personal steps you want to take over the next few weeks Continue to assess and consider long term plans with marriage, housing,etc Reconnect with resources provided [facility], counseling. Legal support)         Our next appointment is by telephone on 10/30/22  Please call the care guide team at [phone] if you need to cancel or reschedule your appointment.   If you are experiencing a Mental Health or Behavioral Health Crisis or need someone to talk to, please call the Suicide and Crisis Lifeline: 988 call 911   The patient verbalized understanding of instructions, educational materials, and care plan provided today and DECLINED offer to receive copy of patient instructions, educational materials, and care plan.   Telephone follow up appointment with care management team member scheduled for:10/30/22  Reece Levy, MSW, LCSW Clinical Social Worker [employer] [employer] [phone]

## 2022-09-25 NOTE — Patient Outreach (Signed)
  Care Coordination   Follow Up Visit Note   09/25/2022 Name: Deborah Farley MRN: [id number] DOB: 02/20/1946  Deborah Farley is a 77 y.o. year old female who sees Cox, Kirsten, MD for primary care. I spoke with  Deborah Farley by phone today.  What matters to the patients health and wellness today?  Dr Sedalia Muta has been great; changed one of my meds (now on Effexor?) and it's been a big help.    Goals Addressed               This Visit's Progress     Keep Myself Safe (pt-stated)         Activities and task to complete in order to accomplish goals.    Glad you are remaining safe and away from your home- glad you can stay with other family Glad you were able to visit your sister in [place]/hospice care Continue to take medications as prescribed- glad the new RX that Dr Sedalia Muta began seems to be working well! Consider legal consultation as we discussed-  Self Support options  (remain safe and consider options with husband, housing, etc) Personal steps you want to take over the next few weeks Continue to assess and consider long term plans with marriage, housing,etc Reconnect with resources provided [facility], counseling. Legal support)         SDOH assessments and interventions completed:  Yes     Care Coordination Interventions:  Yes, provided  Interventions Today    Flowsheet Row Most Recent Value  Chronic Disease   Chronic disease during today's visit Other  [depression]  General Interventions   General Interventions Discussed/Reviewed Community Resources  Mental Health Interventions   Mental Health Discussed/Reviewed Mental Health Discussed, Coping Strategies, Depression, Other  [Pt continues to stay with extended family and not at home where her husband is- Pt has  started a new SSRI prescribed by PCP- glad it has "been a big help".]  Advanced Directive Interventions   Advanced Directives Discussed/Reviewed Advanced Directives Discussed, Provided resource for acquiring  and filling out documents  [Discussed with pt process for rescinding current HCPOA (husband) documents and completing new documents]       Follow up plan: Follow up call scheduled for 10/30/22    Encounter Outcome:  Pt. Visit Completed

## 2022-10-03 ENCOUNTER — Ambulatory Visit: Payer: 59 | Admitting: Family Medicine

## 2022-10-06 ENCOUNTER — Other Ambulatory Visit: Payer: Self-pay | Admitting: Family Medicine

## 2022-10-06 DIAGNOSIS — F332 Major depressive disorder, recurrent severe without psychotic features: Secondary | ICD-10-CM

## 2022-10-09 ENCOUNTER — Other Ambulatory Visit: Payer: Self-pay

## 2022-10-09 DIAGNOSIS — F332 Major depressive disorder, recurrent severe without psychotic features: Secondary | ICD-10-CM

## 2022-10-09 MED ORDER — PRAVASTATIN SODIUM 20 MG PO TABS: 20.0000 mg | ORAL_TABLET | Freq: Every day | ORAL | 0 refills | Status: DC

## 2022-10-09 MED ORDER — VENLAFAXINE HCL ER 75 MG PO CP24: ORAL_CAPSULE | ORAL | 0 refills | Status: DC

## 2022-10-10 ENCOUNTER — Other Ambulatory Visit: Payer: Self-pay

## 2022-10-10 ENCOUNTER — Other Ambulatory Visit: Payer: Self-pay | Admitting: Family Medicine

## 2022-10-10 DIAGNOSIS — F332 Major depressive disorder, recurrent severe without psychotic features: Secondary | ICD-10-CM

## 2022-10-10 MED ORDER — FOLIC ACID 1 MG PO TABS: 1.0000 mg | ORAL_TABLET | Freq: Every day | ORAL | 1 refills | Status: DC

## 2022-10-12 ENCOUNTER — Other Ambulatory Visit: Payer: Self-pay

## 2022-10-12 DIAGNOSIS — F332 Major depressive disorder, recurrent severe without psychotic features: Secondary | ICD-10-CM

## 2022-10-12 MED ORDER — VENLAFAXINE HCL ER 75 MG PO CP24: ORAL_CAPSULE | ORAL | 2 refills | Status: DC

## 2022-10-16 ENCOUNTER — Other Ambulatory Visit: Payer: Self-pay | Admitting: Family Medicine

## 2022-10-16 DIAGNOSIS — F332 Major depressive disorder, recurrent severe without psychotic features: Secondary | ICD-10-CM

## 2022-10-23 ENCOUNTER — Telehealth: Payer: Self-pay

## 2022-10-23 NOTE — Telephone Encounter (Signed)
Left vm to call back

## 2022-10-23 NOTE — Telephone Encounter (Signed)
-----   Message from Garwin Brothers, MD sent at 10/20/2022 11:23 AM EDT ----- The results of the study is unremarkable. Please inform patient. I will discuss in detail at next appointment. Cc  primary care/referring physician Garwin Brothers, MD 10/20/2022 11:23 AM

## 2022-10-24 ENCOUNTER — Encounter: Payer: Self-pay | Admitting: Medical

## 2022-10-25 ENCOUNTER — Ambulatory Visit (INDEPENDENT_AMBULATORY_CARE_PROVIDER_SITE_OTHER): Payer: 59 | Admitting: Family Medicine

## 2022-10-25 ENCOUNTER — Encounter: Payer: Self-pay | Admitting: Family Medicine

## 2022-10-25 VITALS — BP 144/72 | HR 76 | Temp 96.7°F | Resp 14 | Ht 63.0 in | Wt 153.0 lb

## 2022-10-25 DIAGNOSIS — E039 Hypothyroidism, unspecified: Secondary | ICD-10-CM | POA: Diagnosis not present

## 2022-10-25 DIAGNOSIS — I10 Essential (primary) hypertension: Secondary | ICD-10-CM | POA: Diagnosis not present

## 2022-10-25 DIAGNOSIS — E782 Mixed hyperlipidemia: Secondary | ICD-10-CM | POA: Diagnosis not present

## 2022-10-25 DIAGNOSIS — I5022 Chronic systolic (congestive) heart failure: Secondary | ICD-10-CM

## 2022-10-25 DIAGNOSIS — F332 Major depressive disorder, recurrent severe without psychotic features: Secondary | ICD-10-CM | POA: Diagnosis not present

## 2022-10-25 DIAGNOSIS — M0609 Rheumatoid arthritis without rheumatoid factor, multiple sites: Secondary | ICD-10-CM

## 2022-10-25 DIAGNOSIS — R7303 Prediabetes: Secondary | ICD-10-CM

## 2022-10-25 DIAGNOSIS — D696 Thrombocytopenia, unspecified: Secondary | ICD-10-CM

## 2022-10-25 MED ORDER — BUMETANIDE 1 MG PO TABS: 1.0000 mg | ORAL_TABLET | Freq: Every day | ORAL | 1 refills | Status: DC | PRN

## 2022-10-25 MED ORDER — VENLAFAXINE HCL ER 150 MG PO CP24: ORAL_CAPSULE | ORAL | 0 refills | Status: DC

## 2022-10-25 NOTE — Progress Notes (Signed)
Subjective:  Patient ID: Deborah Farley, female    DOB: 1946/03/24  Age: 77 y.o. MRN: [id number]  Chief Complaint  Patient presents with   Medical Management of Chronic Issues    HPI PreDiabetes:  Most recent A1C: 5.3 Current medications: None  Hyperlipidemia: Current medications: Pravastatin 20 mg daily  Hypertension with systolic chf: Current medications: Diltiazem 120 mg, Metoprolol 25 mg 2 times daily. Bumex as needed for swelling.  Rheumatoid arthritis: Methotrexate 2.5 mg takes 10 tablets on Sundays. Folic acid 1 mg daily.    Hypothyroidism: Synthroid 25 mcg daily  Depression: Venlafaxine XR 75 mg daily.  She did see some improvement in her symptoms but her sister recently passed and it has been very difficult for her.       10/25/2022   11:05 AM 09/13/2022    3:12 PM 09/07/2022   11:54 AM 04/05/2021    1:25 PM 12/30/2020    1:28 PM  Depression screen PHQ 2/9  Decreased Interest 2 3 0 3 3  Down, Depressed, Hopeless 3 3 3 3 3   PHQ - 2 Score 5 6 3 6 6   Altered sleeping 1 0 0 0 0  Tired, decreased energy 1 2 3 1 2   Change in appetite 0 0 0 1 3  Feeling bad or failure about yourself  2 3 3 3 2   Trouble concentrating 3 3 3 3 3   Moving slowly or fidgety/restless 2 2 2 2 1   Suicidal thoughts 0 0 1 3 2   PHQ-9 Score 14 16 15 19 19   Difficult doing work/chores Very difficult Somewhat difficult Very difficult  Somewhat difficult       10/25/2022   11:06 AM 09/13/2022    3:13 PM  GAD 7 : Generalized Anxiety Score  Nervous, Anxious, on Edge 3 3  Control/stop worrying 3 3  Worry too much - different things 3 3  Trouble relaxing 3 3  Restless 3 2  Easily annoyed or irritable 2 2  Afraid - awful might happen 0 1  Total GAD 7 Score 17 17  Anxiety Difficulty Very difficult Somewhat difficult           10/25/2022   11:05 AM 09/13/2022    3:12 PM 09/07/2022   11:54 AM 04/05/2021    1:25 PM 12/30/2020    1:28 PM  Depression screen PHQ 2/9  Decreased Interest 2 3 0 3 3   Down, Depressed, Hopeless 3 3 3 3 3   PHQ - 2 Score 5 6 3 6 6   Altered sleeping 1 0 0 0 0  Tired, decreased energy 1 2 3 1 2   Change in appetite 0 0 0 1 3  Feeling bad or failure about yourself  2 3 3 3 2   Trouble concentrating 3 3 3 3 3   Moving slowly or fidgety/restless 2 2 2 2 1   Suicidal thoughts 0 0 1 3 2   PHQ-9 Score 14 16 15 19 19   Difficult doing work/chores Very difficult Somewhat difficult Very difficult  Somewhat difficult        10/25/2022   11:05 AM  Fall Risk   Falls in the past year? 1  Number falls in past yr: 0  Injury with Fall? 0  Risk for fall due to : History of fall(s)  Follow up Falls evaluation completed;Falls prevention discussed    Patient Care Team: Blane Ohara, MD as PCP - General (Family Medicine) Karie Soda, MD as Consulting Physician (General Surgery) Meryl Dare, MD  as Consulting Physician (Gastroenterology) Revankar, Aundra Dubin, MD as Consulting Physician (Cardiology) Buck Mam, LCSW as Social Worker (Licensed Visual merchandiser)   Review of Systems  Constitutional:  Negative for chills, fatigue and fever.  HENT:  Negative for congestion, rhinorrhea and sore throat.   Respiratory:  Negative for cough and shortness of breath.   Cardiovascular:  Negative for chest pain.  Gastrointestinal:  Positive for nausea. Negative for abdominal pain, constipation and vomiting.  Genitourinary:  Negative for dysuria and urgency.  Musculoskeletal:  Negative for back pain and myalgias.  Neurological:  Positive for light-headedness and headaches. Negative for dizziness and weakness.  Psychiatric/Behavioral:  Positive for dysphoric mood. Negative for confusion. The patient is nervous/anxious.     Current Outpatient Medications on File Prior to Visit  Medication Sig Dispense Refill   ALPRAZolam (XANAX) 0.5 MG tablet Take 0.5 tablets (0.25 mg total) by mouth daily as needed for anxiety. 30 tablet 1   buPROPion (WELLBUTRIN XL) 300 MG 24 hr  tablet Take 1 tablet (300 mg total) by mouth at bedtime. 90 tablet 1   diltiazem (CARDIZEM CD) 120 MG 24 hr capsule Take 1 capsule (120 mg total) by mouth daily. 90 capsule 1   folic acid (FOLVITE) 1 MG tablet Take 1 tablet (1 mg total) by mouth daily. 90 tablet 1   latanoprost (XALATAN) 0.005 % ophthalmic solution Place 1 drop into both eyes at bedtime.     levothyroxine (SYNTHROID) 25 MCG tablet Take 1 tablet (25 mcg total) by mouth daily before breakfast. 90 tablet 1   methotrexate (RHEUMATREX) 2.5 MG tablet Take 10 tablets by mouth every Sunday. **Caution: Chemotherapy. Protect from light.** 120 tablet 0   metoprolol tartrate (LOPRESSOR) 25 MG tablet Take 1 tablet (25 mg total) by mouth 2 (two) times daily. 180 tablet 0   nitroGLYCERIN (NITROSTAT) 0.4 MG SL tablet Place 1 tablet (0.4 mg total) under the tongue every 5 (five) minutes as needed for chest pain. 25 tablet 7   ondansetron (ZOFRAN-ODT) 4 MG disintegrating tablet Take 1 tablet (4 mg total) by mouth every 8 (eight) hours as needed for vomiting or nausea. 30 tablet 1   polyethylene glycol (MIRALAX / GLYCOLAX) 17 g packet Take 17 g by mouth daily as needed for mild constipation. 14 each 3   pravastatin (PRAVACHOL) 20 MG tablet Take 1 tablet (20 mg total) by mouth daily. 90 tablet 0   traMADol (ULTRAM) 50 MG tablet Take 1 tablet (50 mg total) by mouth every 6 (six) hours as needed. 30 tablet 0   traZODone (DESYREL) 50 MG tablet Take 1 tablet by mouth at bedtime. Appointment needed. 30 tablet 0   No current facility-administered medications on file prior to visit.   Past Medical History:  Diagnosis Date   Acid reflux 02/29/2016   Acute respiratory failure with hypoxia (HCC) 07/19/2021   Acute sinusitis 03/19/2020   Allergy    Chronic idiopathic constipation 07/02/2015   Chronic pain syndrome 11/30/2020   Chronic systolic congestive heart failure (HCC)    Constipation 07/19/2021   Coronary artery disease involving native coronary  artery of native heart with unstable angina pectoris (HCC) 07/02/2015   Formatting of this note might be different from the original. Revenkar   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   Degeneration of lumbar intervertebral disc 03/02/2021   Depression    Depression, major, single episode, moderate (HCC) 08/31/2020   Diet-controlled diabetes mellitus (HCC)    Dyspnea on exertion 01/16/2019   Epigastric pain 04/08/2015  Gait disturbance 03/15/2017   Heart disease    Hiatal hernia 07/02/2015   High risk medication use 04/12/2016   Methotrexate PLQ Eye Exam: 07/28/16 WNL @ [facility] Follow up in 6 months.    History of diabetes mellitus 05/31/2016   Hyperlipidemia 02/29/2016   Hypertension    Hypokalemia 07/19/2021   Hypothyroidism    Hypoxia 07/18/2021   Incarcerated hiatal hernia s/p robotic repair 07/15/2021 07/15/2021   Left-sided chest pain 07/19/2021   Major depressive disorder, recurrent episode, moderate (HCC) 07/02/2015   Malaise and fatigue 07/02/2015   Memory difficulty    Mild protein-calorie malnutrition (HCC) 07/18/2021   Nausea and vomiting 05/17/2021   Obesity (BMI 35.0-39.9 without comorbidity) 02/29/2016   Obesity, diabetes, and hypertension syndrome (HCC)    Organoaxial gastric volvulus 05/17/2021   Pleural effusion on left 07/19/2021   Pre-diabetes    Progressive angina (HCC) 08/11/2015   PTSD (post-traumatic stress disorder) 11/30/2020   Rheumatoid arthritis (HCC) 04/12/2016   Spastic esophagus    Suspected pulmonary embolism 03/11/2019   Thrombocytopenia (HCC) 07/18/2021   Transaminitis 07/18/2021   Past Surgical History:  Procedure Laterality Date   ABDOMINAL HYSTERECTOMY     BACK SURGERY     BALLOON DILATION  01/27/2022   Procedure: BALLOON DILATION;  Surgeon: Karie Soda, MD;  Location: [facility];  Service: General;;   CARDIAC CATHETERIZATION N/A 08/12/2015   Procedure: Left Heart Cath and Coronary Angiography;  Surgeon: Yates Decamp, MD;  Location: [facility]  INVASIVE CV LAB;  Service: Cardiovascular;  Laterality: N/A;   ESOPHAGEAL MANOMETRY N/A 07/01/2021   Procedure: ESOPHAGEAL MANOMETRY (EM);  Surgeon: Meryl Dare, MD;  Location: [facility];  Service: Endoscopy;  Laterality: N/A;   ESOPHAGOGASTRODUODENOSCOPY N/A 01/27/2022   Procedure: ESOPHAGOGASTRODUODENOSCOPY (EGD);  Surgeon: Karie Soda, MD;  Location: [facility];  Service: General;  Laterality: N/A;   KENALOG INJECTION  01/27/2022   Procedure: Pauline Good INJECTION;  Surgeon: Karie Soda, MD;  Location: [facility];  Service: General;;   LEFT HEART CATH AND CORONARY ANGIOGRAPHY N/A 02/20/2018   Procedure: LEFT HEART CATH AND CORONARY ANGIOGRAPHY;  Surgeon: Lennette Bihari, MD;  Location: [facility] INVASIVE CV LAB;  Service: Cardiovascular;  Laterality: N/A;   LEFT HEART CATH AND CORONARY ANGIOGRAPHY N/A 01/19/2020   Procedure: LEFT HEART CATH AND CORONARY ANGIOGRAPHY;  Surgeon: Marykay Lex, MD;  Location[facility] INVASIVE CV LAB;  Service: Cardiovascular;  Laterality: N/A;   TOTAL ABDOMINAL HYSTERECTOMY W/ BILATERAL SALPINGOOPHORECTOMY     XI ROBOTIC ASSISTED HIATAL HERNIA REPAIR N/A 07/15/2021   Procedure: robotic paraesophageal hiatal hernia with fundoplication, diaphragm release and mesh repair and bilateral TAP block;  Surgeon: Karie Soda, MD;  Location: [facility];  Service: General;  Laterality: N/A;    Family History  Problem Relation Age of Onset   Heart disease Mother    Diabetes Mother    Hypertension Mother        after birth   Heart disease Father    Heart attack Father 66   Hypertension Sister    Diabetes Sister    Diabetes Maternal Grandmother    Kidney disease Daughter        kidney removed after birth   Diabetes Maternal Aunt        all 7 maternal Aunts   Other Son        Adopted   Breast cancer Neg Hx    Social History   Socioeconomic History   Marital status: Married    Spouse name:  Brynda Peon   Number of children: 2   Years of education: 12   Highest  education level: Associate degree: occupational, Scientist, product/process development, or vocational program  Occupational History   Occupation: retired  Tobacco Use   Smoking status: Never    Passive exposure: Never   Smokeless tobacco: Never  Vaping Use   Vaping Use: Never used  Substance and Sexual Activity   Alcohol use: No   Drug use: No   Sexual activity: Yes    Partners: Male  Other Topics Concern   Not on file  Social History Narrative   Not on file   Social Determinants of Health   Financial Resource Strain: High Risk (09/11/2022)   Overall Financial Resource Strain (CARDIA)    Difficulty of Paying Living Expenses: Very hard  Food Insecurity: Food Insecurity Present (09/11/2022)   Hunger Vital Sign    Worried About Running Out of Food in the Last Year: Sometimes true    Ran Out of Food in the Last Year: Sometimes true  Transportation Needs: No Transportation Needs (09/11/2022)   PRAPARE - Administrator, Civil Service (Medical): No    Lack of Transportation (Non-Medical): No  Physical Activity: Unknown (09/11/2022)   Exercise Vital Sign    Days of Exercise per Week: 0 days    Minutes of Exercise per Session: Not on file  Stress: Stress Concern Present (09/11/2022)   [employer] of Occupational Health - Occupational Stress Questionnaire    Feeling of Stress : Very much  Social Connections: Moderately Isolated (09/11/2022)   Social Connection and Isolation Panel [NHANES]    Frequency of Communication with Friends and Family: More than three times a week    Frequency of Social Gatherings with Friends and Family: Once a week    Attends Religious Services: More than 4 times per year    Active Member of [employer] or Organizations: No    Attends Engineer, structural: Not on file    Marital Status: Separated    Objective:  BP (!) 144/72   Pulse 76   Temp (!) 96.7 F (35.9 C)   Resp 14   Ht 5\' 3"  (1.6 m)   Wt 153 lb (69.4 kg)   BMI 27.10 kg/m      10/25/2022   11:28 AM  10/25/2022   11:01 AM 09/13/2022    3:12 PM  BP/Weight  Systolic BP 144 136 104  Diastolic BP 72 84 60  Wt. (Lbs)  153 158  BMI  27.1 kg/m2 27.99 kg/m2    Physical Exam Vitals reviewed.  Constitutional:      Appearance: Normal appearance. She is normal weight.  Neck:     Vascular: No carotid bruit.  Cardiovascular:     Rate and Rhythm: Normal rate and regular rhythm.     Heart sounds: Normal heart sounds.  Pulmonary:     Effort: Pulmonary effort is normal. No respiratory distress.     Breath sounds: Normal breath sounds.  Abdominal:     General: Abdomen is flat. Bowel sounds are normal.     Palpations: Abdomen is soft.     Tenderness: There is no abdominal tenderness.  Neurological:     Mental Status: She is alert and oriented to person, place, and time.  Psychiatric:        Mood and Affect: Mood normal.        Behavior: Behavior normal.     Diabetic Foot Exam - Simple   No data  filed      Lab Results  Component Value Date   WBC 5.7 10/25/2022   HGB 13.7 10/25/2022   HCT 40.1 10/25/2022   PLT 119 (L) 10/25/2022   GLUCOSE 66 (L) 10/25/2022   CHOL 123 09/07/2022   TRIG 85 09/07/2022   HDL 48 09/07/2022   LDLCALC 58 09/07/2022   ALT 36 (H) 10/25/2022   AST 62 (H) 10/25/2022   NA 144 10/25/2022   K 4.1 10/25/2022   CL 109 (H) 10/25/2022   CREATININE 0.92 10/25/2022   BUN 9 10/25/2022   CO2 23 10/25/2022   TSH 3.740 09/07/2022   INR 1.07 08/12/2015   HGBA1C 5.5 09/07/2022   MICROALBUR 80 07/29/2020      Assessment & Plan:    Mixed hyperlipidemia Assessment & Plan: Well controlled.  No changes to medicines. Continue pravastatin. Continue to work on eating a healthy diet and exercise.  Labs drawn today.    Orders: -     Comprehensive metabolic panel  Hypothyroidism, unspecified type Assessment & Plan: Previously well controlled Continue Synthroid at current dose  Recheck TSH and adjust Synthroid as indicated     Severe episode of recurrent  major depressive disorder, without psychotic features (HCC) Assessment & Plan: The current medical regimen is effective;  continue present plan and medications.  Venlafaxine XR 75 mg daily.    Orders: -     Venlafaxine HCl ER; TAKE 1 CAPSULE(75 MG) BY MOUTH DAILY WITH BREAKFAST  Dispense: 90 capsule; Refill: 0  Primary hypertension Assessment & Plan: Well controlled.  Medications: Diltiazem 120 mg, Metoprolol 25 mg 2 times daily. Bumex as needed for swelling. No medication changes recommended. Continue healthy diet and exercise.     Prediabetes Assessment & Plan: Hemoglobin A1c 5.3%, 3 month avg of blood sugars, is in prediabetic range.  In order to prevent progression to diabetes, recommend low carb diet and regular exercise    Rheumatoid arthritis of multiple sites with negative rheumatoid factor (HCC) Assessment & Plan: Continue methotrexate and folate.   Chronic systolic congestive heart failure [facility]) Assessment & Plan: The current medical regimen is effective;  continue present plan and medications.   Orders: -     Bumetanide; Take 1 tablet (1 mg total) by mouth daily as needed (swelling).  Dispense: 90 tablet; Refill: 1  Thrombocytopenia (HCC) -     CBC with Differential/Platelet     Meds ordered this encounter  Medications   bumetanide (BUMEX) 1 MG tablet    Sig: Take 1 tablet (1 mg total) by mouth daily as needed (swelling).    Dispense:  90 tablet    Refill:  1   venlafaxine XR (EFFEXOR-XR) 150 MG 24 hr capsule    Sig: TAKE 1 CAPSULE(75 MG) BY MOUTH DAILY WITH BREAKFAST    Dispense:  90 capsule    Refill:  0    Orders Placed This Encounter  Procedures   CBC with Differential/Platelet   Comprehensive metabolic panel     Follow-up: Return in about 2 months (around 12/25/2022) for chronic follow up.   I,Carolyn M Morrison,acting as a Neurosurgeon for Blane Ohara, MD.,have documented all relevant documentation on the behalf of Blane Ohara, MD,as directed by   Blane Ohara, MD while in the presence of Blane Ohara, MD.   An After Visit Summary was printed and given to the patient.  I attest that I have reviewed this visit and agree with the plan scribed by my staff.   Blane Ohara,  MD [place] [phone]

## 2022-10-26 LAB — COMPREHENSIVE METABOLIC PANEL
ALT: 36 IU/L — ABNORMAL HIGH (ref 0–32)
AST: 62 IU/L — ABNORMAL HIGH (ref 0–40)
Albumin: 3.6 g/dL — ABNORMAL LOW (ref 3.8–4.8)
Alkaline Phosphatase: 141 IU/L — ABNORMAL HIGH (ref 44–121)
BUN/Creatinine Ratio: 10 — ABNORMAL LOW (ref 12–28)
BUN: 9 mg/dL (ref 8–27)
Bilirubin Total: 0.6 mg/dL (ref 0.0–1.2)
CO2: 23 mmol/L (ref 20–29)
Calcium: 9.2 mg/dL (ref 8.7–10.3)
Chloride: 109 mmol/L — ABNORMAL HIGH (ref 96–106)
Creatinine, Ser: 0.92 mg/dL (ref 0.57–1.00)
Globulin, Total: 2.8 g/dL (ref 1.5–4.5)
Glucose: 66 mg/dL — ABNORMAL LOW (ref 70–99)
Potassium: 4.1 mmol/L (ref 3.5–5.2)
Sodium: 144 mmol/L (ref 134–144)
Total Protein: 6.4 g/dL (ref 6.0–8.5)
eGFR: 64 mL/min/{1.73_m2} (ref >59)

## 2022-10-26 LAB — CBC WITH DIFFERENTIAL/PLATELET
Basophils Absolute: 0 10*3/uL (ref 0.0–0.2)
Basos: 1 %
EOS (ABSOLUTE): 0.1 10*3/uL (ref 0.0–0.4)
Eos: 3 %
Hematocrit: 40.1 % (ref 34.0–46.6)
Hemoglobin: 13.7 g/dL (ref 11.1–15.9)
Immature Grans (Abs): 0 10*3/uL (ref 0.0–0.1)
Immature Granulocytes: 0 %
Lymphocytes Absolute: 1.9 10*3/uL (ref 0.7–3.1)
Lymphs: 34 %
MCH: 34.4 pg — ABNORMAL HIGH (ref 26.6–33.0)
MCHC: 34.2 g/dL (ref 31.5–35.7)
MCV: 101 fL — ABNORMAL HIGH (ref 79–97)
Monocytes Absolute: 0.4 10*3/uL (ref 0.1–0.9)
Monocytes: 8 %
Neutrophils Absolute: 3.2 10*3/uL (ref 1.4–7.0)
Neutrophils: 54 %
Platelets: 119 10*3/uL — ABNORMAL LOW (ref 150–450)
RBC: 3.98 x10E6/uL (ref 3.77–5.28)
RDW: 14.2 % (ref 11.7–15.4)
WBC: 5.7 10*3/uL (ref 3.4–10.8)

## 2022-10-27 ENCOUNTER — Telehealth: Payer: Self-pay

## 2022-10-27 DIAGNOSIS — R748 Abnormal levels of other serum enzymes: Secondary | ICD-10-CM

## 2022-10-27 NOTE — Telephone Encounter (Signed)
Patient informed of lab results and patient is ok with Liver ultrasound. Putting order in for patient.

## 2022-10-27 NOTE — Assessment & Plan Note (Signed)
Well controlled.  °No changes to medicines. Continue pravastatin. °Continue to work on eating a healthy diet and exercise.  °Labs drawn today.  ° °

## 2022-10-27 NOTE — Assessment & Plan Note (Signed)
Hemoglobin A1c 5.3%, 3 month avg of blood sugars, is in prediabetic range.  In order to prevent progression to diabetes, recommend low carb diet and regular exercise  

## 2022-10-27 NOTE — Assessment & Plan Note (Signed)
Well controlled.  Medications: Diltiazem 120 mg, Metoprolol 25 mg 2 times daily. Bumex as needed for swelling. No medication changes recommended. Continue healthy diet and exercise.   

## 2022-10-27 NOTE — Assessment & Plan Note (Signed)
The current medical regimen is effective;  continue present plan and medications.  Venlafaxine XR 75 mg daily.

## 2022-10-27 NOTE — Assessment & Plan Note (Signed)
The current medical regimen is effective;  continue present plan and medications.  

## 2022-10-27 NOTE — Assessment & Plan Note (Signed)
Previously well controlled Continue Synthroid at current dose  Recheck TSH and adjust Synthroid as indicated   

## 2022-10-29 ENCOUNTER — Encounter: Payer: Self-pay | Admitting: Family Medicine

## 2022-10-29 NOTE — Assessment & Plan Note (Signed)
Continue methotrexate and folate.  

## 2022-10-30 ENCOUNTER — Telehealth: Payer: Self-pay | Admitting: Family Medicine

## 2022-10-30 ENCOUNTER — Encounter: Payer: Self-pay | Admitting: *Deleted

## 2022-10-30 NOTE — Telephone Encounter (Signed)
   Deborah Farley has been scheduled for the following appointment:  WHAT: GB/RUQ ULTRASOUND WHERE:  OUTPATIENT DATE: 11/01/22 TIME: 9:30 AM CHECK-IN  Patient has been made aware. NPO AFTER MIDNIGHT

## 2022-11-01 ENCOUNTER — Other Ambulatory Visit: Payer: Self-pay | Admitting: Family Medicine

## 2022-11-01 DIAGNOSIS — F332 Major depressive disorder, recurrent severe without psychotic features: Secondary | ICD-10-CM

## 2022-11-01 DIAGNOSIS — M0609 Rheumatoid arthritis without rheumatoid factor, multiple sites: Secondary | ICD-10-CM

## 2022-11-02 ENCOUNTER — Encounter: Payer: Self-pay | Admitting: Family Medicine

## 2022-11-02 ENCOUNTER — Ambulatory Visit: Payer: Self-pay | Admitting: *Deleted

## 2022-11-02 NOTE — Patient Instructions (Signed)
Visit Information  Thank you for taking time to visit with me today. Please don't hesitate to contact me if I can be of assistance to you.   Following are the goals we discussed today:   Goals Addressed               This Visit's Progress     Keep Myself Safe (pt-stated)         Activities and task to complete in order to accomplish goals.    Glad you are remaining safe and away from your home- glad you can stay with other family Glad you were able to  talk by phone to your sister as she passed away- consider gried counseling when/if want Continue to take medications as prescribed- glad the new RX that Dr Sedalia Muta began seems to be working well! Consider legal consultation as we discussed-  Self Support options  (remain safe and consider options with husband, housing, etc) Personal steps you want to take over the next few weeks Continue to assess and consider long term plans with marriage, housing,etc Reconnect with resources provided [facility], counseling. Legal support)         Our next appointment is by telephone on 12/04/22 1pm  Please call the care guide team at [phone] if you need to cancel or reschedule your appointment.   If you are experiencing a Mental Health or Behavioral Health Crisis or need someone to talk to, please call the Suicide and Crisis Lifeline: 988 call 911   The patient verbalized understanding of instructions, educational materials, and care plan provided today and DECLINED offer to receive copy of patient instructions, educational materials, and care plan.   Telephone follow up appointment with care management team member scheduled for:12/04/22  Reece Levy, MSW, LCSW Clinical Social Worker [employer] [employer] [phone]

## 2022-11-02 NOTE — Patient Outreach (Addendum)
  Care Coordination   Follow Up Visit Note   11/02/2022 Name: Deborah Farley MRN: [id number] DOB: November 25, 1945  Deborah Farley is a 77 y.o. year old female who sees Cox, Kirsten, MD for primary care. I spoke with  [name] by phone today.  What matters to the patients health and wellness today?  Pt's sister passed away 2022-11-03    Goals Addressed               This Visit's Progress     Keep Myself Safe (pt-stated)         Activities and task to complete in order to accomplish goals.    Glad you are remaining safe and away from your home- glad you can stay with other family Glad you were able to  talk by phone to your sister as she passed away- consider gried counseling when/if want Continue to take medications as prescribed- glad the new RX that Dr Sedalia Muta began seems to be working well! Consider legal consultation as we discussed-  Self Support options  (remain safe and consider options with husband, housing, etc) Personal steps you want to take over the next few weeks Continue to assess and consider long term plans with marriage, housing,etc Reconnect with resources provided [facility], counseling. Legal support)         SDOH assessments and interventions completed:  Yes     Care Coordination Interventions:  Yes, provided  Interventions Today    Flowsheet Row Most Recent Value  Chronic Disease   Chronic disease during today's visit Other  [depression/domestic issues]  General Interventions   General Interventions Discussed/Reviewed Community Resources  Mental Health Interventions   Mental Health Discussed/Reviewed Mental Health Discussed, Coping Strategies, Grief and Loss, Depression, Other  [Pt coping appriopriately after the passing of her sister- finds comfort in being able to talk to her at the time she passed. Pt continues to reflect and consider her long term plans for housing as she does not plan to return home to husband.]  Safety Interventions    Safety Discussed/Reviewed Safety Discussed  [Pt remains near [place] staying with granddaughter-]       Follow up plan: Follow up call scheduled for 12/04/22    Encounter Outcome:  Pt. Visit Completed

## 2022-11-07 ENCOUNTER — Other Ambulatory Visit: Payer: Self-pay | Admitting: Family Medicine

## 2022-11-07 DIAGNOSIS — R10811 Right upper quadrant abdominal tenderness: Secondary | ICD-10-CM

## 2022-12-01 ENCOUNTER — Other Ambulatory Visit: Payer: Self-pay

## 2022-12-01 ENCOUNTER — Other Ambulatory Visit: Payer: Self-pay | Admitting: Family Medicine

## 2022-12-01 DIAGNOSIS — I10 Essential (primary) hypertension: Secondary | ICD-10-CM

## 2022-12-04 ENCOUNTER — Ambulatory Visit: Payer: Self-pay | Admitting: *Deleted

## 2022-12-04 IMAGING — RF DG ESOPHAGUS
5 series · 14 of 20 positions shown · IV contrast (omnipaque)
Comparison: Preoperative upper GI 04/25/2021.

CLINICAL DATA: 1 day s/p hiatal hernia repair with Crespin
Sule

EXAM:
ESOPHAGRAM
TECHNIQUE: Limited single contrast examination was performed using Omnipaque
300. This exam was performed by Madriz, Tomasz, and was
supervised and interpreted by Honive Martillier.
FLUOROSCOPY:
Radiation Exposure Index (as provided by the fluoroscopic device):
26.8 mGy

[Series 1: cp_standard · 3 of 322 frames shown (1 of 5)]
[frame 46/322]
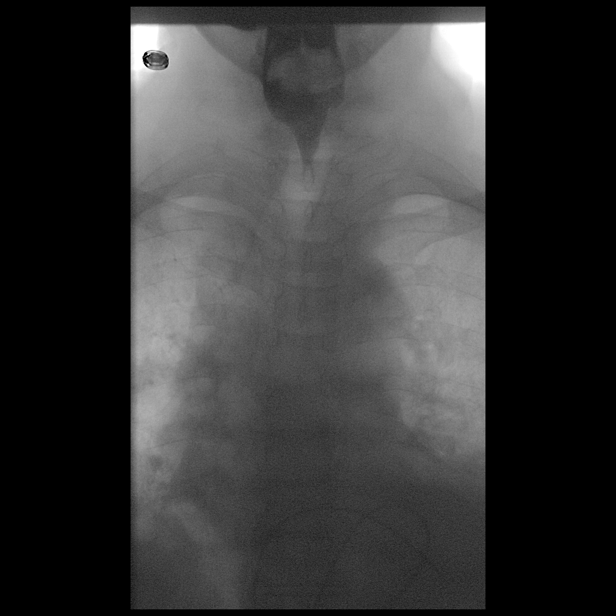
[frame 162/322]
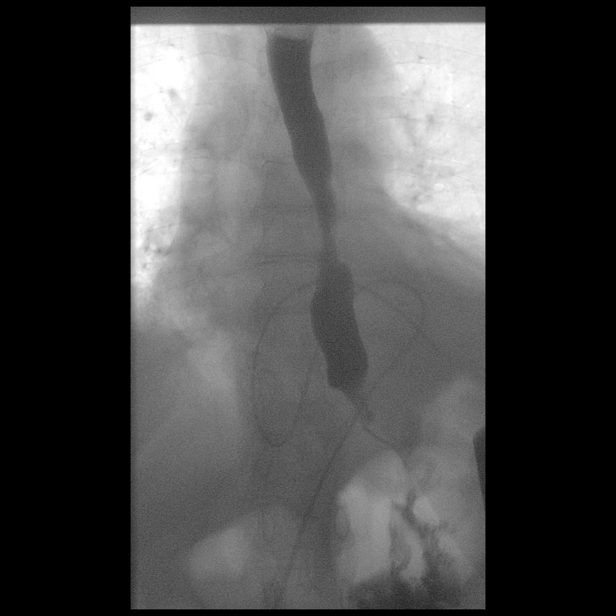
[frame 274/322]
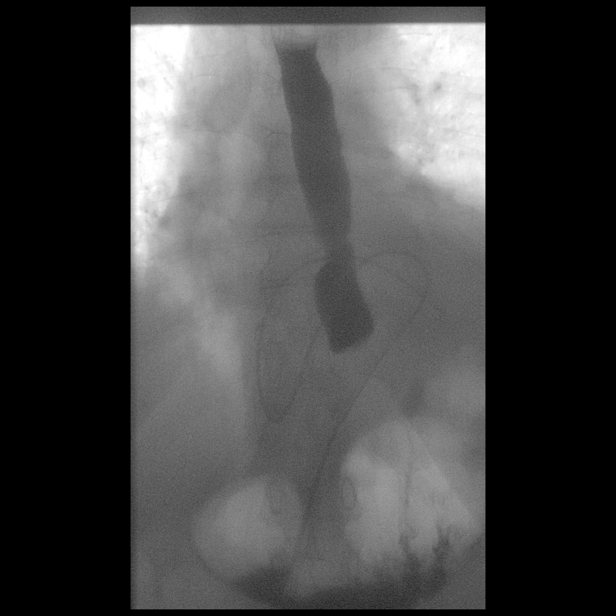

[Series 2: cp_standard · 3 of 140 frames shown (2 of 5)]
[frame 71/140]
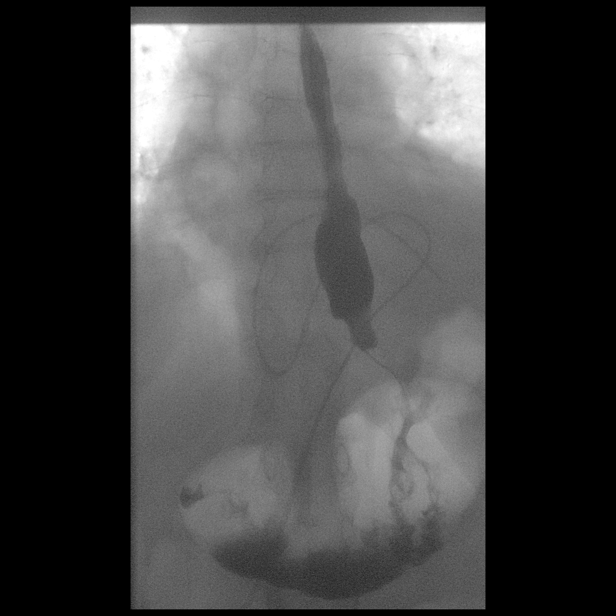
[frame 99/140]
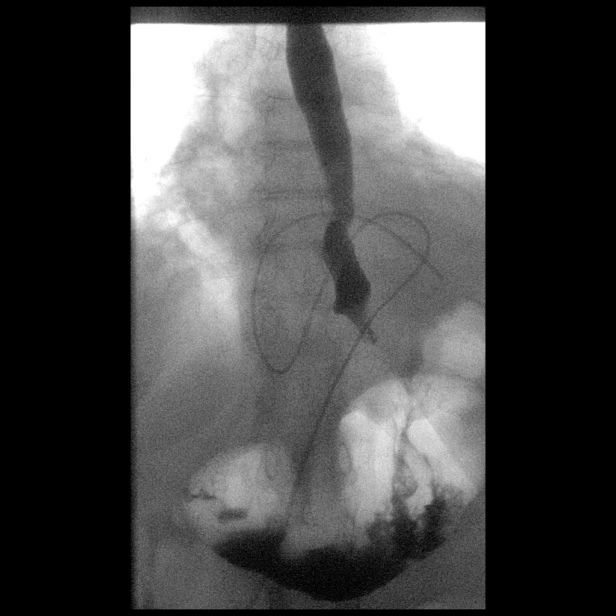
[frame 120/140]
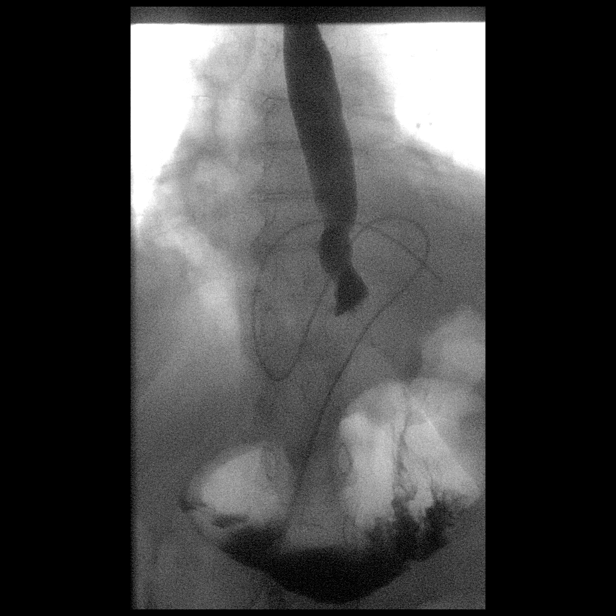

[Series 3: cp_standard · 2 of 335 frames shown (3 of 5)]
[frame 51/335]
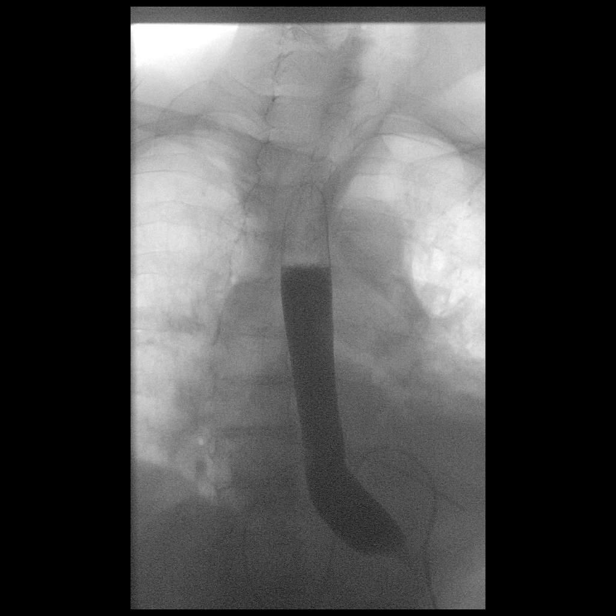
[frame 168/335]
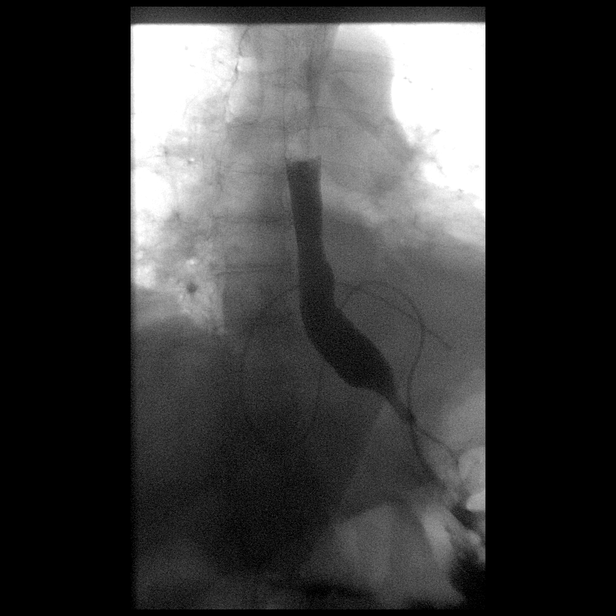

[Series 4: cp_standard · 3 of 170 frames shown (4 of 5)]
[frame 8/170]
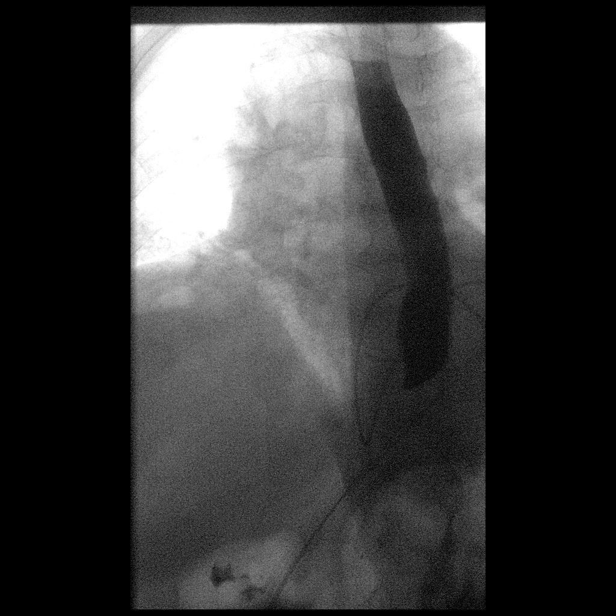
[frame 26/170]
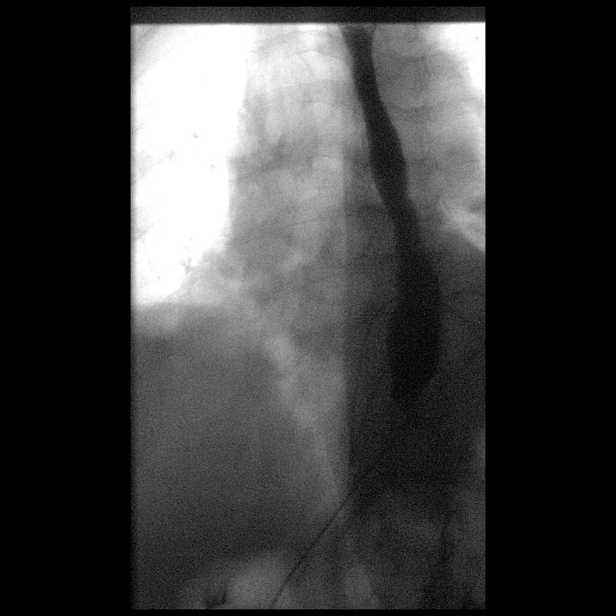
[frame 145/170]
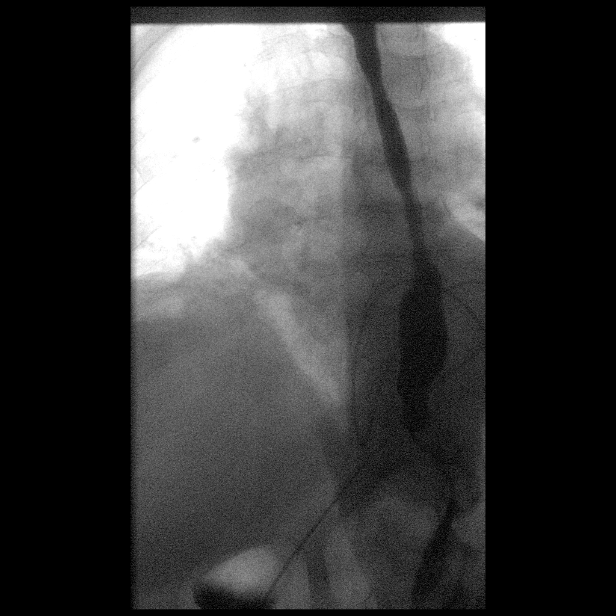

[Series 5: cp_standard · 3 of 92 frames shown (5 of 5)]
[frame 14/92]
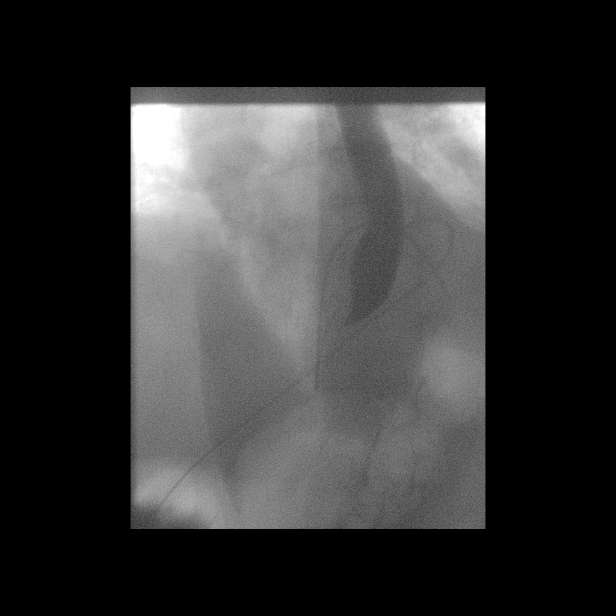
[frame 47/92]
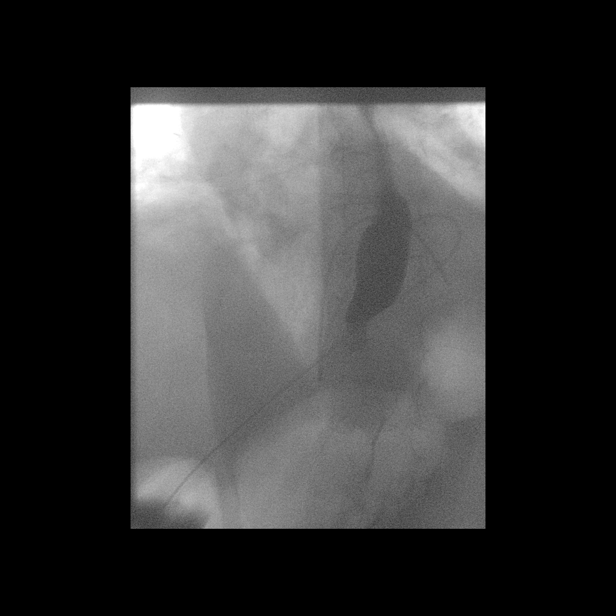
[frame 86/92]
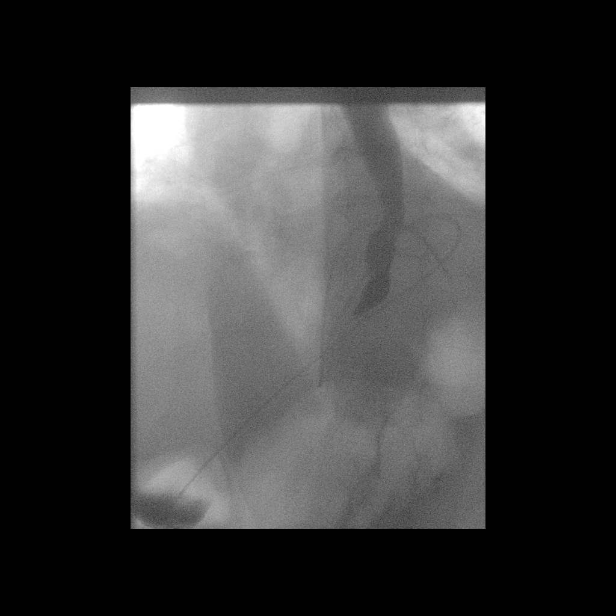

[14 of 20 positions shown; findings below may reference images not displayed]

FINDINGS: Patient swallows contrast without difficulty. Contrast traverses the
esophageal lumen swiftly but is delayed in transit through the GE
junction/presumed site of wrap resulting in multiple tertiary
contractions.

Contrast remains intraluminal without evidence of postsurgical leak.
IMPRESSION: Delayed passage of contrast through the GE junction suggestive of
postoperative edema. No contrast extravasation/evidence for
postoperative leak.

## 2022-12-04 NOTE — Patient Instructions (Signed)
Visit Information  Thank you for taking time to visit with me today. Please don't hesitate to contact me if I can be of assistance to you.   Following are the goals we discussed today:   Goals Addressed               This Visit's Progress     Keep Myself Safe (pt-stated)         Activities and task to complete in order to accomplish goals.    Call the #'s provided today for local counseling support and once find one you like, schedule an in person visit  Glad you are remaining safe and away from your home- glad you can stay with other family Allow yourself time needed to grieve the loss of your sister since her passing Continue to take medications as prescribed- glad the new RX that Dr Sedalia Muta began seems to be working well! Consider legal consultation as we discussed-  Self Support options  (remain safe and consider options with husband, housing, etc) Personal steps you want to take over the next few weeks Continue to assess and consider long term plans with marriage, housing,etc Reconnect with resources provided [facility], counseling. Legal support)         Our next appointment is by telephone on 12/11/22    Please call the care guide team at [phone] if you need to cancel or reschedule your appointment.   If you are experiencing a Mental Health or Behavioral Health Crisis or need someone to talk to, please call the Suicide and Crisis Lifeline: 988 call 911   The patient verbalized understanding of instructions, educational materials, and care plan provided today and DECLINED offer to receive copy of patient instructions, educational materials, and care plan.   Telephone follow up appointment with care management team member scheduled for: 12/11/22  Reece Levy, MSW, LCSW Clinical Social Worker [facility] [phone]

## 2022-12-04 NOTE — Patient Outreach (Signed)
  Care Coordination   Follow Up Visit Note   12/04/2022 Name: Deborah Farley MRN: [id number] DOB: Dec 26, 1945  Deborah Farley is a 77 y.o. year old female who sees Cox, Kirsten, MD for primary care. I spoke with  [name] by phone today.  What matters to the patients health and wellness today?  Feeling a lack of interest of anything.    Goals Addressed               This Visit's Progress     Keep Myself Safe (pt-stated)         Activities and task to complete in order to accomplish goals.    Call the #'s provided today for local counseling support and once find one you like, schedule an in person visit  Glad you are remaining safe and away from your home- glad you can stay with other family Allow yourself time needed to grieve the loss of your sister since her passing Continue to take medications as prescribed- glad the new RX that Dr Sedalia Muta began seems to be working well! Consider legal consultation as we discussed-  Self Support options  (remain safe and consider options with husband, housing, etc) Personal steps you want to take over the next few weeks Continue to assess and consider long term plans with marriage, housing,etc Reconnect with resources provided [facility], counseling. Legal support)         SDOH assessments and interventions completed:  Yes     Care Coordination Interventions:  Yes, provided  Interventions Today    Flowsheet Row Most Recent Value  Chronic Disease   Chronic disease during today's visit Other  [depression]  General Interventions   General Interventions Discussed/Reviewed Community Resources  Mental Health Interventions   Mental Health Discussed/Reviewed Mental Health Discussed, Coping Strategies, Depression, Mental Health Reviewed, Grief and Loss  [Pt admits to apathy, depression, grief- recent loss of her sister and an overall feeling of anhedonia. She denies SI and has contracted with PCP. CSW assisted pt with searching for  Counseling in [facility] and will call to inquire/schedule this week.]  Safety Interventions   Safety Discussed/Reviewed Safety Discussed  [Pt aware of 988 number to call for mental health crisis line support]       Follow up plan: Follow up call scheduled for 12/11/22    Encounter Outcome:  Pt. Visit Completed

## 2022-12-11 ENCOUNTER — Ambulatory Visit: Payer: Self-pay | Admitting: *Deleted

## 2022-12-11 NOTE — Patient Outreach (Signed)
  Care Coordination   Follow Up Visit Note   12/11/2022 Name: Deborah Farley MRN: [id number] DOB: [birth year]  Deborah Farley is a 77 y.o. year old female who sees Cox, Kirsten, MD for primary care. I spoke with  Deborah Farley by phone today.  What matters to the patients health and wellness today?  Seeking local counselor to connect with for therapy.   Goals Addressed               This Visit's Progress     Keep Myself Safe (pt-stated)         Activities and task to complete in order to accomplish goals.    Look for email from [facility] with providers Call the #'s provided today for local counseling support and once find one you like, schedule an in person visit  Glad you are remaining safe and away from your home- glad you can stay with other family Allow yourself time needed to grieve the loss of your sister since her passing Continue to take medications as prescribed- glad the new RX that Dr Sedalia Muta began seems to be working well! Consider legal consultation as we discussed-  Self Support options  (remain safe and consider options with husband, housing, etc) Personal steps you want to take over the next few weeks Continue to assess and consider long term plans with marriage, housing,etc Reconnect with resources provided [facility], counseling. Legal support)         SDOH assessments and interventions completed:  Yes     Care Coordination Interventions:  Yes, provided  Interventions Today    Flowsheet Row Most Recent Value  Chronic Disease   Chronic disease during today's visit Other  [feeling overwhelmed/depressed]  General Interventions   General Interventions Discussed/Reviewed General Interventions Discussed, Copy is pursuing local [facility]) counseling providers to consider.]  Mental Health Interventions   Mental Health Discussed/Reviewed Mental Health Discussed, Mental Health Reviewed, Coping Strategies, Depression  [Pt admits to being  depressed and overwhelmed this past week-  support offered-]       Follow up plan: Follow up call scheduled for 12/15/22    Encounter Outcome:  Pt. Visit Completed

## 2022-12-11 NOTE — Patient Instructions (Signed)
Visit Information  Thank you for taking time to visit with me today. Please don't hesitate to contact me if I can be of assistance to you.   Following are the goals we discussed today:   Goals Addressed               This Visit's Progress     Keep Myself Safe (pt-stated)         Activities and task to complete in order to accomplish goals.    Look for email from [facility] with providers Call the #'s provided today for local counseling support and once find one you like, schedule an in person visit  Glad you are remaining safe and away from your home- glad you can stay with other family Allow yourself time needed to grieve the loss of your sister since her passing Continue to take medications as prescribed- glad the new RX that Dr Sedalia Muta began seems to be working well! Consider legal consultation as we discussed-  Self Support options  (remain safe and consider options with husband, housing, etc) Personal steps you want to take over the next few weeks Continue to assess and consider long term plans with marriage, housing,etc Reconnect with resources provided [facility], counseling. Legal support)         Our next appointment is by telephone on 12/15/22  Please call the care guide team at [phone] if you need to cancel or reschedule your appointment.   If you are experiencing a Mental Health or Behavioral Health Crisis or need someone to talk to, please call the Suicide and Crisis Lifeline: 988 call 911   The patient verbalized understanding of instructions, educational materials, and care plan provided today and DECLINED offer to receive copy of patient instructions, educational materials, and care plan.   Telephone follow up appointment with care management team member scheduled for: 12/15/22 Reece Levy, MSW, LCSW Clinical Social Worker [facility] [phone]

## 2022-12-13 ENCOUNTER — Other Ambulatory Visit: Payer: Self-pay

## 2022-12-13 ENCOUNTER — Telehealth: Payer: Self-pay

## 2022-12-13 DIAGNOSIS — R10811 Right upper quadrant abdominal tenderness: Secondary | ICD-10-CM

## 2022-12-13 DIAGNOSIS — R948 Abnormal results of function studies of other organs and systems: Secondary | ICD-10-CM

## 2022-12-13 NOTE — Telephone Encounter (Signed)
Patient was notified of abnormal biliary scan results.  Dr. Sedalia Muta advised General Surgery referral.  She wants to be seen by Dr. Michaell Cowing at [facility] in [place].

## 2022-12-15 ENCOUNTER — Encounter: Payer: 59 | Admitting: *Deleted

## 2022-12-15 ENCOUNTER — Telehealth: Payer: Self-pay | Admitting: *Deleted

## 2022-12-15 NOTE — Progress Notes (Signed)
Pt called to cx upcoming appt with Licensed Clinical SW - asked pt to reschedule at a later date - pt declined to give reason for canceling and declined to reschedule at this time

## 2022-12-29 ENCOUNTER — Ambulatory Visit (INDEPENDENT_AMBULATORY_CARE_PROVIDER_SITE_OTHER): Payer: 59 | Admitting: Family Medicine

## 2022-12-29 ENCOUNTER — Encounter: Payer: Self-pay | Admitting: Family Medicine

## 2022-12-29 VITALS — BP 144/72 | HR 72 | Temp 97.2°F | Resp 18 | Ht 63.0 in | Wt 139.4 lb

## 2022-12-29 DIAGNOSIS — R748 Abnormal levels of other serum enzymes: Secondary | ICD-10-CM | POA: Diagnosis not present

## 2022-12-29 DIAGNOSIS — E039 Hypothyroidism, unspecified: Secondary | ICD-10-CM

## 2022-12-29 DIAGNOSIS — F332 Major depressive disorder, recurrent severe without psychotic features: Secondary | ICD-10-CM

## 2022-12-29 DIAGNOSIS — E782 Mixed hyperlipidemia: Secondary | ICD-10-CM

## 2022-12-29 DIAGNOSIS — F331 Major depressive disorder, recurrent, moderate: Secondary | ICD-10-CM

## 2022-12-29 DIAGNOSIS — D696 Thrombocytopenia, unspecified: Secondary | ICD-10-CM

## 2022-12-29 DIAGNOSIS — I10 Essential (primary) hypertension: Secondary | ICD-10-CM

## 2022-12-29 MED ORDER — VENLAFAXINE HCL ER 75 MG PO CP24: 225.0000 mg | ORAL_CAPSULE | Freq: Every day | ORAL | 0 refills | Status: DC

## 2022-12-29 MED ORDER — LOSARTAN POTASSIUM 25 MG PO TABS: 25.0000 mg | ORAL_TABLET | Freq: Every day | ORAL | 0 refills | Status: DC

## 2022-12-29 NOTE — Progress Notes (Unsigned)
Subjective:  Patient ID: Deborah Farley, female    DOB: November 13, 1945  Age: 77 y.o. MRN: [id number]  Chief Complaint  Patient presents with   Medical Management of Chronic Issues    HPI PreDiabetes:  Most recent A1C: 5.3 Current medications: None  Hyperlipidemia: Current medications: Pravastatin 20 mg daily  Hypertension with systolic chf: Current medications: Diltiazem 120 mg, Metoprolol 25 mg 2 times daily. Bumex as needed for swelling.  Rheumatoid arthritis: Methotrexate 2.5 mg takes 10 tablets on Sundays. Folic acid 1 mg daily.    Hypothyroidism: Synthroid 25 mcg daily  Depression: Venlafaxine XR 150 mg daily.  She has improved with the higher dose.  .        12/29/2022   11:19 AM 10/25/2022   11:05 AM 09/13/2022    3:12 PM 09/07/2022   11:54 AM 04/05/2021    1:25 PM  Depression screen PHQ 2/9  Decreased Interest 3 2 3  0 3  Down, Depressed, Hopeless 3 3 3 3 3   PHQ - 2 Score 6 5 6 3 6   Altered sleeping 1 1 0 0 0  Tired, decreased energy 3 1 2 3 1   Change in appetite 1 0 0 0 1  Feeling bad or failure about yourself  2 2 3 3 3   Trouble concentrating 3 3 3 3 3   Moving slowly or fidgety/restless 3 2 2 2 2   Suicidal thoughts 0 0 0 1 3  PHQ-9 Score 19 14 16 15 19   Difficult doing work/chores Very difficult Very difficult Somewhat difficult Very difficult         12/29/2022   11:19 AM  Fall Risk   Falls in the past year? 1  Number falls in past yr: 1  Injury with Fall? 0  Risk for fall due to : No Fall Risks  Follow up Falls evaluation completed;Falls prevention discussed    Patient Care Team: Blane Ohara, MD as PCP - General (Family Medicine) Karie Soda, MD as Consulting Physician (General Surgery) Meryl Dare, MD as Consulting Physician (Gastroenterology) Revankar, Aundra Dubin, MD as Consulting Physician (Cardiology) Buck Mam, LCSW as Social Worker (Licensed Clinical Social Worker)   Review of Systems  Constitutional:  Negative for chills, fatigue  and fever.  HENT:  Negative for congestion, ear pain and sore throat.   Respiratory:  Negative for cough and shortness of breath.   Cardiovascular:  Negative for chest pain.  Gastrointestinal:  Negative for abdominal pain, constipation, diarrhea, nausea and vomiting.  Genitourinary:  Negative for dysuria and urgency.  Musculoskeletal:  Positive for neck pain. Negative for arthralgias and myalgias.  Skin:  Negative for rash.  Neurological:  Negative for dizziness and headaches.  Psychiatric/Behavioral:  Positive for dysphoric mood. The patient is nervous/anxious.     Current Outpatient Medications on File Prior to Visit  Medication Sig Dispense Refill   ALPRAZolam (XANAX) 0.5 MG tablet Take 0.5 tablets (0.25 mg total) by mouth daily as needed for anxiety. 30 tablet 1   bumetanide (BUMEX) 1 MG tablet Take 1 tablet (1 mg total) by mouth daily as needed (swelling). 90 tablet 1   buPROPion (WELLBUTRIN XL) 300 MG 24 hr tablet Take 1 tablet (300 mg total) by mouth at bedtime. 90 tablet 1   diltiazem (CARDIZEM CD) 120 MG 24 hr capsule Take 1 capsule (120 mg total) by mouth daily. 90 capsule 1   folic acid (FOLVITE) 1 MG tablet Take 1 tablet (1 mg total) by mouth daily. 90 tablet 1  latanoprost (XALATAN) 0.005 % ophthalmic solution Place 1 drop into both eyes at bedtime.     levothyroxine (SYNTHROID) 25 MCG tablet Take 1 tablet (25 mcg total) by mouth daily before breakfast. 90 tablet 1   methotrexate (RHEUMATREX) 2.5 MG tablet Take 10 tablets by mouth every Sunday. Caution: Chemotherapy. Protect from light. 120 tablet 0   metoprolol tartrate (LOPRESSOR) 25 MG tablet Take 1 tablet by mouth twice daily. 180 tablet 0   nitroGLYCERIN (NITROSTAT) 0.4 MG SL tablet Place 1 tablet (0.4 mg total) under the tongue every 5 (five) minutes as needed for chest pain. 25 tablet 7   ondansetron (ZOFRAN-ODT) 4 MG disintegrating tablet Take 1 tablet (4 mg total) by mouth every 8 (eight) hours as needed for vomiting or  nausea. 30 tablet 1   polyethylene glycol (MIRALAX / GLYCOLAX) 17 g packet Take 17 g by mouth daily as needed for mild constipation. 14 each 3   pravastatin (PRAVACHOL) 20 MG tablet Take 1 tablet (20 mg total) by mouth daily. 90 tablet 0   traMADol (ULTRAM) 50 MG tablet Take 1 tablet (50 mg total) by mouth every 6 (six) hours as needed. 30 tablet 0   traZODone (DESYREL) 50 MG tablet Take 1 tablet (50 mg total) by mouth at bedtime. 90 tablet 0   venlafaxine XR (EFFEXOR-XR) 150 MG 24 hr capsule TAKE 1 CAPSULE(75 MG) BY MOUTH DAILY WITH BREAKFAST 90 capsule 0   No current facility-administered medications on file prior to visit.   Past Medical History:  Diagnosis Date   Acid reflux 02/29/2016   Acute respiratory failure with hypoxia (HCC) 07/19/2021   Acute sinusitis 03/19/2020   Allergy    Chronic idiopathic constipation 07/02/2015   Chronic pain syndrome 11/30/2020   Chronic systolic congestive heart failure (HCC)    Constipation 07/19/2021   Coronary artery disease involving native coronary artery of native heart with unstable angina pectoris (HCC) 07/02/2015   Formatting of this note might be different from the original. Revenkar   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   Degeneration of lumbar intervertebral disc 03/02/2021   Depression    Depression, major, single episode, moderate (HCC) 08/31/2020   Diet-controlled diabetes mellitus (HCC)    Dyspnea on exertion 01/16/2019   Epigastric pain 04/08/2015   Gait disturbance 03/15/2017   Heart disease    Hiatal hernia 07/02/2015   High risk medication use 04/12/2016   Methotrexate PLQ Eye Exam: 07/28/16 WNL @ [facility] Follow up in 6 months.    History of diabetes mellitus 05/31/2016   Hyperlipidemia 02/29/2016   Hypertension    Hypokalemia 07/19/2021   Hypothyroidism    Hypoxia 07/18/2021   Incarcerated hiatal hernia s/p robotic repair 07/15/2021 07/15/2021   Left-sided chest pain 07/19/2021   Major depressive disorder, recurrent  episode, moderate (HCC) 07/02/2015   Malaise and fatigue 07/02/2015   Memory difficulty    Mild protein-calorie malnutrition (HCC) 07/18/2021   Nausea and vomiting 05/17/2021   Obesity (BMI 35.0-39.9 without comorbidity) 02/29/2016   Obesity, diabetes, and hypertension syndrome (HCC)    Organoaxial gastric volvulus 05/17/2021   Pleural effusion on left 07/19/2021   Pre-diabetes    Progressive angina (HCC) 08/11/2015   PTSD (post-traumatic stress disorder) 11/30/2020   Rheumatoid arthritis (HCC) 04/12/2016   Spastic esophagus    Suspected pulmonary embolism 03/11/2019   Thrombocytopenia (HCC) 07/18/2021   Transaminitis 07/18/2021   Past Surgical History:  Procedure Laterality Date   ABDOMINAL HYSTERECTOMY     BACK SURGERY  BALLOON DILATION  01/27/2022   Procedure: BALLOON DILATION;  Surgeon: Karie Soda, MD;  Location: [facility];  Service: General;;   CARDIAC CATHETERIZATION N/A 08/12/2015   Procedure: Left Heart Cath and Coronary Angiography;  Surgeon: Yates Decamp, MD;  Location: [facility] INVASIVE CV LAB;  Service: Cardiovascular;  Laterality: N/A;   ESOPHAGEAL MANOMETRY N/A 07/01/2021   Procedure: ESOPHAGEAL MANOMETRY (EM);  Surgeon: Meryl Dare, MD;  Location: [facility];  Service: Endoscopy;  Laterality: N/A;   ESOPHAGOGASTRODUODENOSCOPY N/A 01/27/2022   Procedure: ESOPHAGOGASTRODUODENOSCOPY (EGD);  Surgeon: Karie Soda, MD;  Location: [facility];  Service: General;  Laterality: N/A;   KENALOG INJECTION  01/27/2022   Procedure: Pauline Good INJECTION;  Surgeon: Karie Soda, MD;  Location: [facility];  Service: General;;   LEFT HEART CATH AND CORONARY ANGIOGRAPHY N/A 02/20/2018   Procedure: LEFT HEART CATH AND CORONARY ANGIOGRAPHY;  Surgeon: Lennette Bihari, MD;  Location: [facility] INVASIVE CV LAB;  Service: Cardiovascular;  Laterality: N/A;   LEFT HEART CATH AND CORONARY ANGIOGRAPHY N/A 01/19/2020   Procedure: LEFT HEART CATH AND CORONARY ANGIOGRAPHY;  Surgeon: Marykay Lex, MD;   Location: [facility] INVASIVE CV LAB;  Service: Cardiovascular;  Laterality: N/A;   TOTAL ABDOMINAL HYSTERECTOMY W/ BILATERAL SALPINGOOPHORECTOMY     XI ROBOTIC ASSISTED HIATAL HERNIA REPAIR N/A 07/15/2021   Procedure: robotic paraesophageal hiatal hernia with fundoplication, diaphragm release and mesh repair and bilateral TAP block;  Surgeon: Karie Soda, MD;  Location: [facility];  Service: General;  Laterality: N/A;    Family History  Problem Relation Age of Onset   Heart disease Mother    Diabetes Mother    Hypertension Mother        after birth   Heart disease Father    Heart attack Father 62   Hypertension Sister    Diabetes Sister    Diabetes Maternal Grandmother    Kidney disease Daughter        kidney removed after birth   Diabetes Maternal Aunt        all 7 maternal Aunts   Other Son        Adopted   Breast cancer Neg Hx    Social History   Socioeconomic History   Marital status: Married    Spouse name: Kalolaine Hiott   Number of children: 2   Years of education: 12   Highest education level: Associate degree: occupational, Scientist, product/process development, or vocational program  Occupational History   Occupation: retired  Tobacco Use   Smoking status: Never    Passive exposure: Never   Smokeless tobacco: Never  Vaping Use   Vaping status: Never Used  Substance and Sexual Activity   Alcohol use: No   Drug use: No   Sexual activity: Yes    Partners: Male  Other Topics Concern   Not on file  Social History Narrative   Not on file   Social Determinants of Health   Financial Resource Strain: High Risk (09/11/2022)   Overall Financial Resource Strain (CARDIA)    Difficulty of Paying Living Expenses: Very hard  Food Insecurity: Food Insecurity Present (09/11/2022)   Hunger Vital Sign    Worried About Running Out of Food in the Last Year: Sometimes true    Ran Out of Food in the Last Year: Sometimes true  Transportation Needs: No Transportation Needs (09/11/2022)   PRAPARE - Therapist, art (Medical): No    Lack of Transportation (Non-Medical): No  Physical Activity: Unknown (09/11/2022)  Exercise Vital Sign    Days of Exercise per Week: 0 days    Minutes of Exercise per Session: Not on file  Stress: Stress Concern Present (09/11/2022)   [employer] of Occupational Health - Occupational Stress Questionnaire    Feeling of Stress : Very much  Social Connections: Moderately Isolated (09/11/2022)   Social Connection and Isolation Panel [NHANES]    Frequency of Communication with Friends and Family: More than three times a week    Frequency of Social Gatherings with Friends and Family: Once a week    Attends Religious Services: More than 4 times per year    Active Member of [employer] or Organizations: No    Attends Engineer, structural: Not on file    Marital Status: Separated    Objective:  BP (!) 144/72   Pulse 72   Temp (!) 97.2 F (36.2 C)   Resp 18   Ht 5\' 3"  (1.6 m)   Wt 139 lb 6.4 oz (63.2 kg)   BMI 24.69 kg/m      12/29/2022   11:15 AM 10/25/2022   11:28 AM 10/25/2022   11:01 AM  BP/Weight  Systolic BP 144 144 136  Diastolic BP 72 72 84  Wt. (Lbs) 139.4  153  BMI 24.69 kg/m2  27.1 kg/m2    Physical Exam  Diabetic Foot Exam - Simple   No data filed      Lab Results  Component Value Date   WBC 6.5 12/29/2022   HGB 13.1 12/29/2022   HCT 39.5 12/29/2022   PLT 161 12/29/2022   GLUCOSE 91 12/29/2022   CHOL 123 09/07/2022   TRIG 85 09/07/2022   HDL 48 09/07/2022   LDLCALC 58 09/07/2022   ALT 27 12/29/2022   AST 46 (H) 12/29/2022   NA 141 12/29/2022   K 4.9 12/29/2022   CL 106 12/29/2022   CREATININE 0.86 12/29/2022   BUN 11 12/29/2022   CO2 25 12/29/2022   TSH 3.740 09/07/2022   INR 1.1 12/29/2022   HGBA1C 5.5 09/07/2022   MICROALBUR 80 07/29/2020      Assessment & Plan:    Elevated liver enzymes Assessment & Plan: Check labs  Orders: -     Protime-INR  Severe episode of recurrent major depressive  disorder, without psychotic features (HCC) Assessment & Plan: Increase Effexor XR to 75 mg daily in a.m. (225 mg daily.)   Primary hypertension Assessment & Plan: Start losartan 25 mg daily for uncontrolled blood pressure.    Orders: -     CBC with Differential/Platelet -     Comprehensive metabolic panel  Thrombocytopenia (HCC) Assessment & Plan: Check labs  Orders: -     CBC with Differential/Platelet  Other orders -     Venlafaxine HCl ER; Take 3 capsules (225 mg total) by mouth daily with breakfast.  Dispense: 270 capsule; Refill: 0 -     Losartan Potassium; Take 1 tablet (25 mg total) by mouth daily.  Dispense: 90 tablet; Refill: 0     Meds ordered this encounter  Medications   venlafaxine XR (EFFEXOR XR) 75 MG 24 hr capsule    Sig: Take 3 capsules (225 mg total) by mouth daily with breakfast.    Dispense:  270 capsule    Refill:  0   losartan (COZAAR) 25 MG tablet    Sig: Take 1 tablet (25 mg total) by mouth daily.    Dispense:  90 tablet    Refill:  0  Orders Placed This Encounter  Procedures   CBC with Differential/Platelet   Comprehensive metabolic panel   Protime-INR     Follow-up: No follow-ups on file.   I,Carolyn M Morrison,acting as a Neurosurgeon for Blane Ohara, MD.,have documented all relevant documentation on the behalf of Blane Ohara, MD,as directed by  Blane Ohara, MD while in the presence of Blane Ohara, MD.   Clayborn Bigness I Leal-Borjas,acting as a scribe for Blane Ohara, MD.,have documented all relevant documentation on the behalf of Blane Ohara, MD,as directed by  Blane Ohara, MD while in the presence of Blane Ohara, MD.     An After Visit Summary was printed and given to the patient.  Blane Ohara, MD [facility] [phone]

## 2022-12-29 NOTE — Patient Instructions (Signed)
Start losartan 25 mg daily for uncontrolled blood pressure.  Increase Effexor XR to 75 mg daily in a.m. (225 mg daily.)

## 2022-12-30 DIAGNOSIS — R748 Abnormal levels of other serum enzymes: Secondary | ICD-10-CM | POA: Insufficient documentation

## 2022-12-30 DIAGNOSIS — K7460 Unspecified cirrhosis of liver: Secondary | ICD-10-CM | POA: Insufficient documentation

## 2022-12-30 HISTORY — DX: Unspecified cirrhosis of liver: K74.60

## 2022-12-30 LAB — COMPREHENSIVE METABOLIC PANEL
ALT: 27 IU/L (ref 0–32)
AST: 46 IU/L — ABNORMAL HIGH (ref 0–40)
Albumin: 3.6 g/dL — ABNORMAL LOW (ref 3.8–4.8)
Alkaline Phosphatase: 145 IU/L — ABNORMAL HIGH (ref 44–121)
BUN/Creatinine Ratio: 13 (ref 12–28)
BUN: 11 mg/dL (ref 8–27)
Bilirubin Total: 1 mg/dL (ref 0.0–1.2)
CO2: 25 mmol/L (ref 20–29)
Calcium: 9.3 mg/dL (ref 8.7–10.3)
Chloride: 106 mmol/L (ref 96–106)
Creatinine, Ser: 0.86 mg/dL (ref 0.57–1.00)
Globulin, Total: 3 g/dL (ref 1.5–4.5)
Glucose: 91 mg/dL (ref 70–99)
Potassium: 4.9 mmol/L (ref 3.5–5.2)
Sodium: 141 mmol/L (ref 134–144)
Total Protein: 6.6 g/dL (ref 6.0–8.5)
eGFR: 70 mL/min/{1.73_m2} (ref >59)

## 2022-12-30 LAB — CBC WITH DIFFERENTIAL/PLATELET
Basophils Absolute: 0.1 10*3/uL (ref 0.0–0.2)
Basos: 1 %
EOS (ABSOLUTE): 0.2 10*3/uL (ref 0.0–0.4)
Eos: 3 %
Hematocrit: 39.5 % (ref 34.0–46.6)
Hemoglobin: 13.1 g/dL (ref 11.1–15.9)
Immature Grans (Abs): 0 10*3/uL (ref 0.0–0.1)
Immature Granulocytes: 0 %
Lymphocytes Absolute: 1.5 10*3/uL (ref 0.7–3.1)
Lymphs: 24 %
MCH: 34.3 pg — ABNORMAL HIGH (ref 26.6–33.0)
MCHC: 33.2 g/dL (ref 31.5–35.7)
MCV: 103 fL — ABNORMAL HIGH (ref 79–97)
Monocytes Absolute: 0.3 10*3/uL (ref 0.1–0.9)
Monocytes: 5 %
Neutrophils Absolute: 4.4 10*3/uL (ref 1.4–7.0)
Neutrophils: 67 %
Platelets: 161 10*3/uL (ref 150–450)
RBC: 3.82 x10E6/uL (ref 3.77–5.28)
RDW: 16.5 % — ABNORMAL HIGH (ref 11.7–15.4)
WBC: 6.5 10*3/uL (ref 3.4–10.8)

## 2022-12-30 LAB — PROTIME-INR
INR: 1.1 (ref 0.9–1.2)
Prothrombin Time: 12.1 s — ABNORMAL HIGH (ref 9.1–12.0)

## 2022-12-30 NOTE — Assessment & Plan Note (Signed)
Check labs 

## 2022-12-30 NOTE — Assessment & Plan Note (Signed)
>>  ASSESSMENT AND PLAN FOR ELEVATED LIVER ENZYMES WRITTEN ON 12/30/2022  8:58 AM BY LEAL-BORJAS, Shyra Emile I, CMA  Check labs

## 2022-12-30 NOTE — Assessment & Plan Note (Signed)
Increase Effexor XR to 75 mg daily in a.m. (225 mg daily.)

## 2022-12-30 NOTE — Assessment & Plan Note (Signed)
Start losartan 25 mg daily for uncontrolled blood pressure.

## 2022-12-31 ENCOUNTER — Other Ambulatory Visit: Payer: Self-pay | Admitting: Family Medicine

## 2023-01-01 ENCOUNTER — Ambulatory Visit: Payer: 59 | Admitting: Family Medicine

## 2023-01-01 ENCOUNTER — Other Ambulatory Visit: Payer: Self-pay

## 2023-01-01 MED ORDER — VENLAFAXINE HCL ER 75 MG PO CP24: 225.0000 mg | ORAL_CAPSULE | Freq: Every day | ORAL | 0 refills | Status: DC

## 2023-01-02 ENCOUNTER — Other Ambulatory Visit: Payer: Self-pay | Admitting: Surgery

## 2023-01-02 ENCOUNTER — Other Ambulatory Visit (HOSPITAL_COMMUNITY): Payer: Self-pay | Admitting: Surgery

## 2023-01-02 DIAGNOSIS — Z8719 Personal history of other diseases of the digestive system: Secondary | ICD-10-CM

## 2023-01-02 DIAGNOSIS — R112 Nausea with vomiting, unspecified: Secondary | ICD-10-CM

## 2023-01-02 DIAGNOSIS — R1010 Upper abdominal pain, unspecified: Secondary | ICD-10-CM

## 2023-01-02 DIAGNOSIS — K828 Other specified diseases of gallbladder: Secondary | ICD-10-CM

## 2023-01-02 DIAGNOSIS — R7989 Other specified abnormal findings of blood chemistry: Secondary | ICD-10-CM

## 2023-01-03 ENCOUNTER — Ambulatory Visit: Payer: Self-pay | Admitting: *Deleted

## 2023-01-03 NOTE — Patient Outreach (Signed)
  Care Coordination   Follow Up Visit Note   01/03/2023 Name: Deborah Farley MRN: [id number] DOB: 11/28/1945  Deborah Farley is a 77 y.o. year old female who sees Cox, Kirsten, MD for primary care. I spoke with  Deborah Farley by phone today.  What matters to the patients health and wellness today?  Dealing with husband who had a stroke.    Goals Addressed               This Visit's Progress     Keep Myself Safe (pt-stated)         Activities and task to complete in order to accomplish goals.    Follow up with Navigator at [facility] regarding locating counselor   Call the #'s provided today for local counseling support and once find one you like, schedule an in person visit  Glad you are remaining safe and away from your home- glad you can stay with other family Allow yourself time needed to grieve the loss of your sister since her passing Continue to take medications as prescribed- glad the new RX that Dr Sedalia Muta began seems to be working well! Consider legal consultation as we discussed-  Self Support options  (remain safe and consider options with husband, housing, etc) Personal steps you want to take over the next few weeks Continue to assess and consider long term plans with marriage, housing,etc Reconnect with resources provided [facility], counseling. Legal support)         SDOH assessments and interventions completed:  Yes     Care Coordination Interventions:  Yes, provided  Interventions Today    Flowsheet Row Most Recent Value  Chronic Disease   Chronic disease during today's visit Other  [depression]  General Interventions   General Interventions Discussed/Reviewed General Interventions Discussed, Community Resources  Education Interventions   Education Provided Provided Education  Provided Verbal Education On When to see the doctor  [Pt was told she needs gallbladder surgery per MD- pt states, "I am not ready" (dealing with a lot right now and wants to  wait)]  Mental Health Interventions   Mental Health Discussed/Reviewed Mental Health Discussed, Coping Strategies, Depression, Other  Safety Interventions   Safety Discussed/Reviewed Safety Discussed  [Pt remains at her granddaughters home in [place], [place]]       Follow up plan: Follow up call scheduled for 01/12/23    Encounter Outcome:  Pt. Visit Completed

## 2023-01-03 NOTE — Patient Instructions (Signed)
Visit Information  Thank you for taking time to visit with me today. Please don't hesitate to contact me if I can be of assistance to you.   Following are the goals we discussed today:   Goals Addressed               This Visit's Progress     Keep Myself Safe (pt-stated)         Activities and task to complete in order to accomplish goals.    Follow up with Navigator at [facility] regarding locating counselor   Call the #'s provided today for local counseling support and once find one you like, schedule an in person visit  Glad you are remaining safe and away from your home- glad you can stay with other family Allow yourself time needed to grieve the loss of your sister since her passing Continue to take medications as prescribed- glad the new RX that Dr Sedalia Muta began seems to be working well! Consider legal consultation as we discussed-  Self Support options  (remain safe and consider options with husband, housing, etc) Personal steps you want to take over the next few weeks Continue to assess and consider long term plans with marriage, housing,etc Reconnect with resources provided [facility], counseling. Legal support)         Our next appointment is by telephone on 01/12/23    Please call the care guide team at [phone] if you need to cancel or reschedule your appointment.   If you are experiencing a Mental Health or Behavioral Health Crisis or need someone to talk to, please call the Suicide and Crisis Lifeline: 988 call 911   The patient verbalized understanding of instructions, educational materials, and care plan provided today and DECLINED offer to receive copy of patient instructions, educational materials, and care plan.   Telephone follow up appointment with care management team member scheduled for: 01/12/23 Reece Levy, MSW, LCSW Clinical Social Worker [phone]

## 2023-01-03 NOTE — Assessment & Plan Note (Signed)
The current medical regimen is effective;  continue present plan and medications. Continue synthroid 25 mcg once daily in am.

## 2023-01-03 NOTE — Assessment & Plan Note (Signed)
Well controlled.  No changes to medicines. Continue pravastatin. Continue to work on eating a healthy diet and exercise.

## 2023-01-05 ENCOUNTER — Ambulatory Visit
Admission: RE | Admit: 2023-01-05 | Discharge: 2023-01-05 | Disposition: A | Discharge status: Home / Self Care | Payer: 59 | Source: Ambulatory Visit | Attending: Surgery | Admitting: Surgery

## 2023-01-05 DIAGNOSIS — Z8719 Personal history of other diseases of the digestive system: Secondary | ICD-10-CM

## 2023-01-05 DIAGNOSIS — R1010 Upper abdominal pain, unspecified: Secondary | ICD-10-CM

## 2023-01-05 DIAGNOSIS — K828 Other specified diseases of gallbladder: Secondary | ICD-10-CM

## 2023-01-05 DIAGNOSIS — R112 Nausea with vomiting, unspecified: Secondary | ICD-10-CM

## 2023-01-12 ENCOUNTER — Encounter: Payer: Self-pay | Admitting: *Deleted

## 2023-01-16 ENCOUNTER — Ambulatory Visit (HOSPITAL_COMMUNITY): Payer: 59

## 2023-01-16 ENCOUNTER — Encounter (HOSPITAL_COMMUNITY): Payer: Self-pay

## 2023-01-25 ENCOUNTER — Other Ambulatory Visit: Payer: Self-pay | Admitting: Family Medicine

## 2023-01-25 DIAGNOSIS — F332 Major depressive disorder, recurrent severe without psychotic features: Secondary | ICD-10-CM

## 2023-01-25 DIAGNOSIS — M0609 Rheumatoid arthritis without rheumatoid factor, multiple sites: Secondary | ICD-10-CM

## 2023-02-08 ENCOUNTER — Ambulatory Visit: Payer: 59 | Admitting: Family Medicine

## 2023-02-12 ENCOUNTER — Telehealth: Payer: Self-pay | Admitting: *Deleted

## 2023-02-12 NOTE — Patient Outreach (Signed)
Care Coordination   02/12/2023 Name: Vonnie Mcwhinney MRN: [id number] DOB: [birth year]   Care Coordination Outreach Attempts:  An unsuccessful telephone outreach was attempted today to offer the patient information about available care coordination services.  Follow Up Plan:  Additional outreach attempts will be made to offer the patient care coordination information and services.   Encounter Outcome:  No Answer   Care Coordination Interventions:  No, not indicated    Reece Levy, MSW, LCSW Clinical Social Worker [phone]

## 2023-02-21 ENCOUNTER — Encounter: Payer: Self-pay | Admitting: *Deleted

## 2023-02-21 ENCOUNTER — Telehealth: Payer: Self-pay | Admitting: *Deleted

## 2023-02-21 NOTE — Patient Outreach (Signed)
Care Coordination   02/21/2023 Name: Raychell Knapke MRN: [id number] DOB: 01/01/1946   Care Coordination Outreach Attempts:  A second unsuccessful outreach was attempted today to offer the patient with information about available care coordination services.  Follow Up Plan:  Additional outreach attempts will be made to offer the patient care coordination information and services.   Encounter Outcome:  No Answer   Care Coordination Interventions:  No, not indicated    Reece Levy, MSW, LCSW Clinical Social Worker [phone]

## 2023-02-22 ENCOUNTER — Telehealth: Payer: Self-pay | Admitting: *Deleted

## 2023-02-22 NOTE — Progress Notes (Signed)
Care Coordination Note  02/22/2023 Name: Deborah Farley MRN: [id number] DOB: Aug 13, 1945  Deborah Farley is a 77 y.o. year old female who is a primary care patient of Cox, Kirsten, MD and is actively engaged with the care management team. I reached out to [name] by phone today to assist with re-scheduling a follow up visit with the Licensed Clinical Social Worker  Follow up plan: Unsuccessful telephone outreach attempt made. A HIPAA compliant phone message was left for the patient providing contact information and requesting a return call.   Burman Nieves, CCMA Care Coordination Care Guide Direct Dial: [phone]

## 2023-02-24 ENCOUNTER — Other Ambulatory Visit: Payer: Self-pay | Admitting: Family Medicine

## 2023-02-24 DIAGNOSIS — F332 Major depressive disorder, recurrent severe without psychotic features: Secondary | ICD-10-CM

## 2023-02-24 DIAGNOSIS — I10 Essential (primary) hypertension: Secondary | ICD-10-CM

## 2023-02-24 DIAGNOSIS — E039 Hypothyroidism, unspecified: Secondary | ICD-10-CM

## 2023-02-27 ENCOUNTER — Other Ambulatory Visit: Payer: Self-pay

## 2023-02-27 DIAGNOSIS — F332 Major depressive disorder, recurrent severe without psychotic features: Secondary | ICD-10-CM

## 2023-02-27 DIAGNOSIS — I10 Essential (primary) hypertension: Secondary | ICD-10-CM

## 2023-02-27 DIAGNOSIS — E039 Hypothyroidism, unspecified: Secondary | ICD-10-CM

## 2023-02-27 MED ORDER — DILTIAZEM HCL ER COATED BEADS 120 MG PO CP24: 120.0000 mg | ORAL_CAPSULE | Freq: Every day | ORAL | 0 refills | Status: DC

## 2023-02-27 MED ORDER — BUPROPION HCL ER (XL) 300 MG PO TB24: 300.0000 mg | ORAL_TABLET | Freq: Every day | ORAL | 0 refills | Status: DC

## 2023-02-27 MED ORDER — LEVOTHYROXINE SODIUM 25 MCG PO TABS: 25.0000 ug | ORAL_TABLET | Freq: Every day | ORAL | 0 refills | Status: DC

## 2023-02-27 NOTE — Progress Notes (Signed)
Care Coordination Note  02/27/2023 Name: Teshia Luma MRN: [id number] DOB: 04-15-1946  Jackleen Holloway is a 77 y.o. year old female who is a primary care patient of Cox, Kirsten, MD and is actively engaged with the care management team. I reached out to [name] by phone today to assist with re-scheduling a follow up visit with the Licensed Clinical Social Worker  Follow up plan: We have been unable to make contact with the patient for follow up.   Burman Nieves, CCMA Care Coordination Care Guide Direct Dial: [phone]

## 2023-02-28 NOTE — Progress Notes (Deleted)
Subjective:  Patient ID: Deborah Farley, female    DOB: 1945/06/25  Age: 77 y.o. MRN: [id number]  No chief complaint on file.   HPI        12/29/2022   11:19 AM 10/25/2022   11:05 AM 09/13/2022    3:12 PM 09/07/2022   11:54 AM 04/05/2021    1:25 PM  Depression screen PHQ 2/9  Decreased Interest 3 2 3  0 3  Down, Depressed, Hopeless 3 3 3 3 3   PHQ - 2 Score 6 5 6 3 6   Altered sleeping 1 1 0 0 0  Tired, decreased energy 3 1 2 3 1   Change in appetite 1 0 0 0 1  Feeling bad or failure about yourself  2 2 3 3 3   Trouble concentrating 3 3 3 3 3   Moving slowly or fidgety/restless 3 2 2 2 2   Suicidal thoughts 0 0 0 1 3  PHQ-9 Score 19 14 16 15 19   Difficult doing work/chores Very difficult Very difficult Somewhat difficult Very difficult         12/29/2022   11:19 AM  Fall Risk   Falls in the past year? 1  Number falls in past yr: 1  Injury with Fall? 0  Risk for fall due to : No Fall Risks  Follow up Falls evaluation completed;Falls prevention discussed    Patient Care Team: Blane Ohara, MD as PCP - General (Family Medicine) Karie Soda, MD as Consulting Physician (General Surgery) Meryl Dare, MD as Consulting Physician (Gastroenterology) Revankar, Aundra Dubin, MD as Consulting Physician (Cardiology)   Review of Systems  Current Outpatient Medications on File Prior to Visit  Medication Sig Dispense Refill   ALPRAZolam (XANAX) 0.5 MG tablet Take 0.5 tablets (0.25 mg total) by mouth daily as needed for anxiety. 30 tablet 1   bumetanide (BUMEX) 1 MG tablet Take 1 tablet (1 mg total) by mouth daily as needed (swelling). 90 tablet 1   buPROPion (WELLBUTRIN XL) 300 MG 24 hr tablet Take 1 tablet (300 mg total) by mouth daily. 90 tablet 0   diltiazem (CARDIZEM CD) 120 MG 24 hr capsule Take 1 capsule (120 mg total) by mouth daily. 90 capsule 0   folic acid (FOLVITE) 1 MG tablet Take 1 tablet (1 mg total) by mouth daily. 90 tablet 1   latanoprost (XALATAN) 0.005 % ophthalmic  solution Place 1 drop into both eyes at bedtime.     levothyroxine (SYNTHROID) 25 MCG tablet Take 1 tablet (25 mcg total) by mouth daily before breakfast. 90 tablet 0   losartan (COZAAR) 25 MG tablet Take 1 tablet (25 mg total) by mouth daily. 90 tablet 0   methotrexate (RHEUMATREX) 2.5 MG tablet Take 10 tablets by mouth every Sunday. Caution: Chemotherapy. Protect from light. 120 tablet 0   metoprolol tartrate (LOPRESSOR) 25 MG tablet Take 1 tablet by mouth twice daily. 180 tablet 0   nitroGLYCERIN (NITROSTAT) 0.4 MG SL tablet Place 1 tablet (0.4 mg total) under the tongue every 5 (five) minutes as needed for chest pain. 25 tablet 7   ondansetron (ZOFRAN-ODT) 4 MG disintegrating tablet Take 1 tablet (4 mg total) by mouth every 8 (eight) hours as needed for vomiting or nausea. 30 tablet 1   polyethylene glycol (MIRALAX / GLYCOLAX) 17 g packet Take 17 g by mouth daily as needed for mild constipation. 14 each 3   pravastatin (PRAVACHOL) 20 MG tablet Take 1 tablet (20 mg total) by mouth daily. 90 tablet 0  traMADol (ULTRAM) 50 MG tablet Take 1 tablet (50 mg total) by mouth every 6 (six) hours as needed. 30 tablet 0   traZODone (DESYREL) 50 MG tablet Take 1 tablet by mouth at bedtime. 90 tablet 0   venlafaxine XR (EFFEXOR-XR) 150 MG 24 hr capsule TAKE 1 CAPSULE(75 MG) BY MOUTH DAILY WITH BREAKFAST 90 capsule 0   venlafaxine XR (EFFEXOR-XR) 75 MG 24 hr capsule Take 3 capsules (225 mg total) by mouth daily with breakfast. 270 capsule 0   No current facility-administered medications on file prior to visit.   Past Medical History:  Diagnosis Date   Acid reflux 02/29/2016   Acute respiratory failure with hypoxia (HCC) 07/19/2021   Acute sinusitis 03/19/2020   Allergy    Chronic idiopathic constipation 07/02/2015   Chronic pain syndrome 11/30/2020   Chronic systolic congestive heart failure (HCC)    Constipation 07/19/2021   Coronary artery disease involving native coronary artery of native heart with  unstable angina pectoris (HCC) 07/02/2015   Formatting of this note might be different from the original. Revenkar   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   Degeneration of lumbar intervertebral disc 03/02/2021   Depression    Depression, major, single episode, moderate (HCC) 08/31/2020   Diet-controlled diabetes mellitus (HCC)    Dyspnea on exertion 01/16/2019   Epigastric pain 04/08/2015   Gait disturbance 03/15/2017   Heart disease    Hiatal hernia 07/02/2015   High risk medication use 04/12/2016   Methotrexate PLQ Eye Exam: 07/28/16 WNL @ [facility] Follow up in 6 months.    History of diabetes mellitus 05/31/2016   Hyperlipidemia 02/29/2016   Hypertension    Hypokalemia 07/19/2021   Hypothyroidism    Hypoxia 07/18/2021   Incarcerated hiatal hernia s/p robotic repair 07/15/2021 07/15/2021   Left-sided chest pain 07/19/2021   Major depressive disorder, recurrent episode, moderate (HCC) 07/02/2015   Malaise and fatigue 07/02/2015   Memory difficulty    Mild protein-calorie malnutrition (HCC) 07/18/2021   Nausea and vomiting 05/17/2021   Obesity (BMI 35.0-39.9 without comorbidity) 02/29/2016   Obesity, diabetes, and hypertension syndrome (HCC)    Organoaxial gastric volvulus 05/17/2021   Pleural effusion on left 07/19/2021   Pre-diabetes    Progressive angina (HCC) 08/11/2015   PTSD (post-traumatic stress disorder) 11/30/2020   Rheumatoid arthritis (HCC) 04/12/2016   Spastic esophagus    Suspected pulmonary embolism 03/11/2019   Thrombocytopenia (HCC) 07/18/2021   Transaminitis 07/18/2021   Past Surgical History:  Procedure Laterality Date   ABDOMINAL HYSTERECTOMY     BACK SURGERY     BALLOON DILATION  01/27/2022   Procedure: BALLOON DILATION;  Surgeon: Karie Soda, MD;  Location: [facility];  Service: General;;   CARDIAC CATHETERIZATION N/A 08/12/2015   Procedure: Left Heart Cath and Coronary Angiography;  Surgeon: Yates Decamp, MD;  Location: [facility] INVASIVE CV LAB;  Service:  Cardiovascular;  Laterality: N/A;   ESOPHAGEAL MANOMETRY N/A 07/01/2021   Procedure: ESOPHAGEAL MANOMETRY (EM);  Surgeon: Meryl Dare, MD;  Location: [facility];  Service: Endoscopy;  Laterality: N/A;   ESOPHAGOGASTRODUODENOSCOPY N/A 01/27/2022   Procedure: ESOPHAGOGASTRODUODENOSCOPY (EGD);  Surgeon: Karie Soda, MD;  Location: [facility];  Service: General;  Laterality: N/A;   KENALOG INJECTION  01/27/2022   Procedure: Pauline Good INJECTION;  Surgeon: Karie Soda, MD;  Location: [facility];  Service: General;;   LEFT HEART CATH AND CORONARY ANGIOGRAPHY N/A 02/20/2018   Procedure: LEFT HEART CATH AND CORONARY ANGIOGRAPHY;  Surgeon: Lennette Bihari, MD;  Location: [facility] INVASIVE  CV LAB;  Service: Cardiovascular;  Laterality: N/A;   LEFT HEART CATH AND CORONARY ANGIOGRAPHY N/A 01/19/2020   Procedure: LEFT HEART CATH AND CORONARY ANGIOGRAPHY;  Surgeon: Marykay Lex, MD;  Location: [facility] INVASIVE CV LAB;  Service: Cardiovascular;  Laterality: N/A;   TOTAL ABDOMINAL HYSTERECTOMY W/ BILATERAL SALPINGOOPHORECTOMY     XI ROBOTIC ASSISTED HIATAL HERNIA REPAIR N/A 07/15/2021   Procedure: robotic paraesophageal hiatal hernia with fundoplication, diaphragm release and mesh repair and bilateral TAP block;  Surgeon: Karie Soda, MD;  Location: [facility];  Service: General;  Laterality: N/A;    Family History  Problem Relation Age of Onset   Heart disease Mother    Diabetes Mother    Hypertension Mother        after birth   Heart disease Father    Heart attack Father 4   Hypertension Sister    Diabetes Sister    Diabetes Maternal Grandmother    Kidney disease Daughter        kidney removed after birth   Diabetes Maternal Aunt        all 7 maternal Aunts   Other Son        Adopted   Breast cancer Neg Hx    Social History   Socioeconomic History   Marital status: Married    Spouse name: Rabab Merritts   Number of children: 2   Years of education: 12   Highest education level: Associate  degree: occupational, Scientist, product/process development, or vocational program  Occupational History   Occupation: retired  Tobacco Use   Smoking status: Never    Passive exposure: Never   Smokeless tobacco: Never  Vaping Use   Vaping status: Never Used  Substance and Sexual Activity   Alcohol use: No   Drug use: No   Sexual activity: Yes    Partners: Male  Other Topics Concern   Not on file  Social History Narrative   Not on file   Social Determinants of Health   Financial Resource Strain: High Risk (09/11/2022)   Overall Financial Resource Strain (CARDIA)    Difficulty of Paying Living Expenses: Very hard  Food Insecurity: Food Insecurity Present (09/11/2022)   Hunger Vital Sign    Worried About Running Out of Food in the Last Year: Sometimes true    Ran Out of Food in the Last Year: Sometimes true  Transportation Needs: No Transportation Needs (09/11/2022)   PRAPARE - Administrator, Civil Service (Medical): No    Lack of Transportation (Non-Medical): No  Physical Activity: Unknown (09/11/2022)   Exercise Vital Sign    Days of Exercise per Week: 0 days    Minutes of Exercise per Session: Not on file  Stress: Stress Concern Present (09/11/2022)   [employer] of Occupational Health - Occupational Stress Questionnaire    Feeling of Stress : Very much  Social Connections: Moderately Isolated (09/11/2022)   Social Connection and Isolation Panel [NHANES]    Frequency of Communication with Friends and Family: More than three times a week    Frequency of Social Gatherings with Friends and Family: Once a week    Attends Religious Services: More than 4 times per year    Active Member of [employer] or Organizations: No    Attends Banker Meetings: Not on file    Marital Status: Separated    Objective:  There were no vitals taken for this visit.     12/29/2022   11:15 AM 10/25/2022   11:28  AM 10/25/2022   11:01 AM  BP/Weight  Systolic BP 144 144 136  Diastolic BP 72 72 84  Wt.  (Lbs) 139.4  153  BMI 24.69 kg/m2  27.1 kg/m2    Physical Exam  Diabetic Foot Exam - Simple   No data filed      Lab Results  Component Value Date   WBC 6.5 12/29/2022   HGB 13.1 12/29/2022   HCT 39.5 12/29/2022   PLT 161 12/29/2022   GLUCOSE 91 12/29/2022   CHOL 123 09/07/2022   TRIG 85 09/07/2022   HDL 48 09/07/2022   LDLCALC 58 09/07/2022   ALT 27 12/29/2022   AST 46 (H) 12/29/2022   NA 141 12/29/2022   K 4.9 12/29/2022   CL 106 12/29/2022   CREATININE 0.86 12/29/2022   BUN 11 12/29/2022   CO2 25 12/29/2022   TSH 3.740 09/07/2022   INR 1.1 12/29/2022   HGBA1C 5.5 09/07/2022   MICROALBUR 80 07/29/2020      Assessment & Plan:    There are no diagnoses linked to this encounter.   No orders of the defined types were placed in this encounter.   No orders of the defined types were placed in this encounter.    Follow-up: No follow-ups on file.   I,Amahri Dengel A Tawn Fitzner,acting as a scribe for Blane Ohara, MD.,have documented all relevant documentation on the behalf of Blane Ohara, MD,as directed by  Blane Ohara, MD while in the presence of Blane Ohara, MD.   An After Visit Summary was printed and given to the patient.  Blane Ohara, MD [facility] [phone]

## 2023-03-01 ENCOUNTER — Ambulatory Visit: Payer: 59 | Admitting: Family Medicine

## 2023-03-09 ENCOUNTER — Ambulatory Visit (HOSPITAL_COMMUNITY): Payer: 59

## 2023-03-23 ENCOUNTER — Ambulatory Visit (HOSPITAL_COMMUNITY)
Admission: RE | Admit: 2023-03-23 | Discharge: 2023-03-23 | Disposition: A | Discharge status: Home / Self Care | Payer: 59 | Source: Ambulatory Visit | Attending: Surgery | Admitting: Surgery

## 2023-03-23 DIAGNOSIS — R1010 Upper abdominal pain, unspecified: Secondary | ICD-10-CM | POA: Diagnosis present

## 2023-03-23 DIAGNOSIS — R7989 Other specified abnormal findings of blood chemistry: Secondary | ICD-10-CM | POA: Diagnosis present

## 2023-03-23 DIAGNOSIS — R112 Nausea with vomiting, unspecified: Secondary | ICD-10-CM | POA: Diagnosis present

## 2023-03-23 DIAGNOSIS — Z8719 Personal history of other diseases of the digestive system: Secondary | ICD-10-CM | POA: Diagnosis present

## 2023-03-23 DIAGNOSIS — K828 Other specified diseases of gallbladder: Secondary | ICD-10-CM | POA: Insufficient documentation

## 2023-03-23 DIAGNOSIS — Z9889 Other specified postprocedural states: Secondary | ICD-10-CM | POA: Insufficient documentation

## 2023-03-23 MED ORDER — TECHNETIUM TC 99M SULFUR COLLOID
2.0000 | Freq: Once | INTRAVENOUS | Status: AC | PRN
Start: 2023-03-23 — End: 2023-03-23
  Administered 2023-03-23: 2 via INTRAVENOUS

## 2023-04-12 ENCOUNTER — Ambulatory Visit: Payer: 59

## 2023-04-25 ENCOUNTER — Other Ambulatory Visit: Payer: Self-pay

## 2023-04-25 DIAGNOSIS — F332 Major depressive disorder, recurrent severe without psychotic features: Secondary | ICD-10-CM

## 2023-04-25 DIAGNOSIS — M0609 Rheumatoid arthritis without rheumatoid factor, multiple sites: Secondary | ICD-10-CM

## 2023-05-25 ENCOUNTER — Other Ambulatory Visit: Payer: Self-pay | Admitting: Family Medicine

## 2023-05-25 DIAGNOSIS — I10 Essential (primary) hypertension: Secondary | ICD-10-CM

## 2023-06-25 ENCOUNTER — Ambulatory Visit: Payer: 59

## 2023-07-18 ENCOUNTER — Other Ambulatory Visit: Payer: Self-pay | Admitting: Family Medicine

## 2023-07-18 DIAGNOSIS — M0609 Rheumatoid arthritis without rheumatoid factor, multiple sites: Secondary | ICD-10-CM

## 2023-07-24 ENCOUNTER — Other Ambulatory Visit: Payer: Self-pay

## 2023-07-24 ENCOUNTER — Other Ambulatory Visit: Payer: Self-pay | Admitting: Family Medicine

## 2023-07-24 DIAGNOSIS — F332 Major depressive disorder, recurrent severe without psychotic features: Secondary | ICD-10-CM

## 2023-08-02 ENCOUNTER — Ambulatory Visit: Payer: Self-pay | Admitting: *Deleted

## 2023-08-02 DIAGNOSIS — K8020 Calculus of gallbladder without cholecystitis without obstruction: Secondary | ICD-10-CM | POA: Diagnosis not present

## 2023-08-02 DIAGNOSIS — F419 Anxiety disorder, unspecified: Secondary | ICD-10-CM | POA: Diagnosis not present

## 2023-08-02 DIAGNOSIS — K766 Portal hypertension: Secondary | ICD-10-CM | POA: Diagnosis not present

## 2023-08-02 DIAGNOSIS — K222 Esophageal obstruction: Secondary | ICD-10-CM | POA: Diagnosis not present

## 2023-08-02 DIAGNOSIS — I361 Nonrheumatic tricuspid (valve) insufficiency: Secondary | ICD-10-CM | POA: Diagnosis not present

## 2023-08-02 DIAGNOSIS — R1310 Dysphagia, unspecified: Secondary | ICD-10-CM | POA: Diagnosis not present

## 2023-08-02 DIAGNOSIS — K828 Other specified diseases of gallbladder: Secondary | ICD-10-CM | POA: Diagnosis not present

## 2023-08-02 DIAGNOSIS — J90 Pleural effusion, not elsewhere classified: Secondary | ICD-10-CM | POA: Diagnosis not present

## 2023-08-02 DIAGNOSIS — R110 Nausea: Secondary | ICD-10-CM | POA: Diagnosis not present

## 2023-08-02 DIAGNOSIS — D696 Thrombocytopenia, unspecified: Secondary | ICD-10-CM | POA: Diagnosis not present

## 2023-08-02 DIAGNOSIS — K9189 Other postprocedural complications and disorders of digestive system: Secondary | ICD-10-CM | POA: Diagnosis not present

## 2023-08-02 DIAGNOSIS — K567 Ileus, unspecified: Secondary | ICD-10-CM | POA: Diagnosis not present

## 2023-08-02 DIAGNOSIS — R918 Other nonspecific abnormal finding of lung field: Secondary | ICD-10-CM | POA: Diagnosis not present

## 2023-08-02 DIAGNOSIS — R109 Unspecified abdominal pain: Secondary | ICD-10-CM | POA: Diagnosis not present

## 2023-08-02 DIAGNOSIS — K7469 Other cirrhosis of liver: Secondary | ICD-10-CM | POA: Diagnosis not present

## 2023-08-02 DIAGNOSIS — K440 Diaphragmatic hernia with obstruction, without gangrene: Secondary | ICD-10-CM | POA: Diagnosis not present

## 2023-08-02 DIAGNOSIS — M069 Rheumatoid arthritis, unspecified: Secondary | ICD-10-CM | POA: Diagnosis not present

## 2023-08-02 DIAGNOSIS — E8809 Other disorders of plasma-protein metabolism, not elsewhere classified: Secondary | ICD-10-CM | POA: Diagnosis not present

## 2023-08-02 DIAGNOSIS — K560 Paralytic ileus: Secondary | ICD-10-CM | POA: Diagnosis not present

## 2023-08-02 DIAGNOSIS — K219 Gastro-esophageal reflux disease without esophagitis: Secondary | ICD-10-CM | POA: Diagnosis not present

## 2023-08-02 DIAGNOSIS — R140 Abdominal distension (gaseous): Secondary | ICD-10-CM | POA: Diagnosis not present

## 2023-08-02 DIAGNOSIS — K7031 Alcoholic cirrhosis of liver with ascites: Secondary | ICD-10-CM | POA: Diagnosis not present

## 2023-08-02 DIAGNOSIS — E876 Hypokalemia: Secondary | ICD-10-CM | POA: Diagnosis not present

## 2023-08-02 DIAGNOSIS — E039 Hypothyroidism, unspecified: Secondary | ICD-10-CM | POA: Diagnosis not present

## 2023-08-02 DIAGNOSIS — R188 Other ascites: Secondary | ICD-10-CM | POA: Diagnosis not present

## 2023-08-02 DIAGNOSIS — I110 Hypertensive heart disease with heart failure: Secondary | ICD-10-CM | POA: Diagnosis not present

## 2023-08-02 DIAGNOSIS — M797 Fibromyalgia: Secondary | ICD-10-CM | POA: Diagnosis not present

## 2023-08-02 DIAGNOSIS — E119 Type 2 diabetes mellitus without complications: Secondary | ICD-10-CM | POA: Diagnosis not present

## 2023-08-02 DIAGNOSIS — K7460 Unspecified cirrhosis of liver: Secondary | ICD-10-CM | POA: Diagnosis not present

## 2023-08-02 DIAGNOSIS — Z8719 Personal history of other diseases of the digestive system: Secondary | ICD-10-CM | POA: Diagnosis not present

## 2023-08-02 DIAGNOSIS — R1031 Right lower quadrant pain: Secondary | ICD-10-CM | POA: Diagnosis not present

## 2023-08-02 NOTE — Telephone Encounter (Signed)
 Agree with referral to ED. Dr. Sedalia Muta

## 2023-08-02 NOTE — Telephone Encounter (Signed)
  Chief Complaint: abdominal pressure/bloating radiating to back Symptoms: see above, unable to have BM- due to pressure/pain, hurts to breath Frequency: started Friday- gotten worse  Disposition: [x] ED /[] Urgent Care (no appt availability in office) / [] Appointment(In office/virtual)/ []  [facility]/ [] Home Care/ [] Refused Recommended Disposition /[] [facility]/ []  Follow-up with PCP Additional Notes: Patient is visiting family in [place]. Patient advised ED- she will contact her daughter in law for transport- advised 911 if she does not have transportation.    Copied from CRM [id number]. Topic: Clinical - Red Word Triage >> Aug 02, 2023 12:00 PM Antwanette L wrote: Red Word that prompted transfer to Nurse Triage: Patient is having severe pain on the right back side and no matter what the pt eats or drink the pt feels bloated. The patient said she needs to have gall bladder testing done. Reason for Disposition  [1] SEVERE pain (e.g., excruciating) AND [2] present > 1 hour  Answer Assessment - Initial Assessment Questions 1. LOCATION: "Where does it hurt?"      All across- under the rib cage 2. RADIATION: "Does the pain shoot anywhere else?" (e.g., chest, back)     Traveling into the back 3. ONSET: "When did the pain begin?" (e.g., minutes, hours or days ago)      Started last Friday- getting worse  5. PATTERN "Does the pain come and go, or is it constant?"    - If it comes and goes: "How long does it last?" "Do you have pain now?"     (Note: Comes and goes means the pain is intermittent. It goes away completely between bouts.)    - If constant: "Is it getting better, staying the same, or getting worse?"      (Note: Constant means the pain never goes away completely; most serious pain is constant and gets worse.)      Constant- feels very distended  6. SEVERITY: "How bad is the pain?"  (e.g., Scale 1-10; mild, moderate, or severe)    - MILD (1-3): Doesn't  interfere with normal activities, abdomen soft and not tender to touch.     - MODERATE (4-7): Interferes with normal activities or awakens from sleep, abdomen tender to touch.     - SEVERE (8-10): Excruciating pain, doubled over, unable to do any normal activities.       7/10  8. CAUSE: "What do you think is causing the stomach pain?"     Unsure-   10. OTHER SYMPTOMS: "Do you have any other symptoms?" (e.g., back pain, diarrhea, fever, urination pain, vomiting)       Not going to bathroom at all- unable to push  Protocols used: Abdominal Pain - [facility]

## 2023-08-08 ENCOUNTER — Telehealth: Payer: Self-pay | Admitting: Family Medicine

## 2023-08-08 NOTE — Telephone Encounter (Signed)
 Spoke with patient to schedule AWV and she told me that she just got home from the hospital yesterday and would like for some one to call her so that she can give her medication changes to them  Thank you,  Judeth Cornfield,  AMB Clinical Support [facility] AWV Program Direct Dial ??[phone]

## 2023-08-14 ENCOUNTER — Ambulatory Visit: Payer: Self-pay

## 2023-08-14 DIAGNOSIS — M069 Rheumatoid arthritis, unspecified: Secondary | ICD-10-CM | POA: Diagnosis not present

## 2023-08-14 DIAGNOSIS — K7469 Other cirrhosis of liver: Secondary | ICD-10-CM | POA: Diagnosis not present

## 2023-08-14 DIAGNOSIS — N200 Calculus of kidney: Secondary | ICD-10-CM | POA: Diagnosis not present

## 2023-08-14 DIAGNOSIS — K7460 Unspecified cirrhosis of liver: Secondary | ICD-10-CM | POA: Diagnosis not present

## 2023-08-14 DIAGNOSIS — R188 Other ascites: Secondary | ICD-10-CM | POA: Diagnosis not present

## 2023-08-14 DIAGNOSIS — K222 Esophageal obstruction: Secondary | ICD-10-CM | POA: Diagnosis not present

## 2023-08-14 DIAGNOSIS — K828 Other specified diseases of gallbladder: Secondary | ICD-10-CM | POA: Diagnosis not present

## 2023-08-14 DIAGNOSIS — E785 Hyperlipidemia, unspecified: Secondary | ICD-10-CM | POA: Diagnosis not present

## 2023-08-14 DIAGNOSIS — I110 Hypertensive heart disease with heart failure: Secondary | ICD-10-CM | POA: Diagnosis not present

## 2023-08-14 DIAGNOSIS — E119 Type 2 diabetes mellitus without complications: Secondary | ICD-10-CM | POA: Diagnosis not present

## 2023-08-14 DIAGNOSIS — I509 Heart failure, unspecified: Secondary | ICD-10-CM | POA: Diagnosis not present

## 2023-08-14 DIAGNOSIS — K219 Gastro-esophageal reflux disease without esophagitis: Secondary | ICD-10-CM | POA: Diagnosis not present

## 2023-08-14 DIAGNOSIS — M797 Fibromyalgia: Secondary | ICD-10-CM | POA: Diagnosis not present

## 2023-08-14 DIAGNOSIS — R8290 Unspecified abnormal findings in urine: Secondary | ICD-10-CM | POA: Diagnosis not present

## 2023-08-14 DIAGNOSIS — K449 Diaphragmatic hernia without obstruction or gangrene: Secondary | ICD-10-CM | POA: Diagnosis not present

## 2023-08-14 DIAGNOSIS — F419 Anxiety disorder, unspecified: Secondary | ICD-10-CM | POA: Diagnosis not present

## 2023-08-14 DIAGNOSIS — I10 Essential (primary) hypertension: Secondary | ICD-10-CM | POA: Diagnosis not present

## 2023-08-14 NOTE — Telephone Encounter (Signed)
 Chief Complaint: abd distention Symptoms: pressure, discomfort, distention Frequency: started yesterday Pertinent Negatives: Patient denies fever, N/V/D, pain,  Disposition: [x] ED /[] Urgent Care (no appt availability in office) / [] Appointment(In office/virtual)/ []  [facility]/ [] Home Care/ [] Refused Recommended Disposition /[] [facility]/ []  Follow-up with PCP Additional Notes: Pt recently hospitalized and dx with SBO and non alcoholic cirrhosis. Pt states that she is having abd discomfort, pressure, but not pain. States "It feels like a belt is wrapped around my waist at times". Pt has outpt referral for GI but per pt "they want me to see my primary". Pt advised to seek ED treatment d/t to no appts and pt is not near the clinic. Pt is staying with her daughter at this time. Pt advised to seek ED treatment from an ED near her. Pt states that she has lost about 70 lbs in the last year, unintentionally, states that she struggles getting food down. Pt tearful during phone conversation d/t problems with spouse. Pt agreeable to go to ED.  Copied from CRM [id number]. Topic: Clinical - Red Word Triage >> Aug 14, 2023 12:14 PM Everette C wrote: [facility] that prompted transfer to Nurse Triage: The patient is currently experiencing abdominal discomfort and has a history of bowel obstruction concerns Reason for Disposition  [1] MILD-MODERATE pain AND [2] constant AND [3] present > 2 hours  Answer Assessment - Initial Assessment Questions 1. LOCATION: "Where does it hurt?"      Not pain, distended and discomfort 2. RADIATION: "Does the pain shoot anywhere else?" (e.g., chest, back)     Does not radiate outside abd but it travels within the abd 3. ONSET: "When did the pain begin?" (e.g., minutes, hours or days ago)      Started yesterday evening 4. SUDDEN: "Gradual or sudden onset?"     intermittent 5. PATTERN "Does the pain come and go, or is it constant?"    - If it comes and  goes: "How long does it last?" "Do you have pain now?"     (Note: Comes and goes means the pain is intermittent. It goes away completely between bouts.)    - If constant: "Is it getting better, staying the same, or getting worse?"      (Note: Constant means the pain never goes away completely; most serious pain is constant and gets worse.)      intermittent 6. SEVERITY: "How bad is the pain?"  (e.g., Scale 1-10; mild, moderate, or severe)    - MILD (1-3): Doesn't interfere with normal activities, abdomen soft and not tender to touch.     - MODERATE (4-7): Interferes with normal activities or awakens from sleep, abdomen tender to touch.     - SEVERE (8-10): Excruciating pain, doubled over, unable to do any normal activities.       Pressure is rated 5 7. RECURRENT SYMPTOM: "Have you ever had this type of stomach pain before?" If Yes, ask: "When was the last time?" and "What happened that time?"      SBO and non alcoholic cirrhosis 9. RELIEVING/AGGRAVATING FACTORS: "What makes it better or worse?" (e.g., antacids, bending or twisting motion, bowel movement)     Denies, but states certain movements make the pt feel like she has a really tight belt on  10. OTHER SYMPTOMS: "Do you have any other symptoms?" (e.g., back pain, diarrhea, fever, urination pain, vomiting)       Pt states that she is having weight loss, 70 lbs in a  year, states that she is still having a problem getting the food down.  Protocols used: Abdominal Pain - Female-A-AH

## 2023-08-14 NOTE — Telephone Encounter (Signed)
 Agree with disposition. Dr. Sedalia Muta

## 2023-08-15 DIAGNOSIS — K7290 Hepatic failure, unspecified without coma: Secondary | ICD-10-CM | POA: Diagnosis not present

## 2023-08-15 DIAGNOSIS — R188 Other ascites: Secondary | ICD-10-CM | POA: Diagnosis not present

## 2023-08-15 DIAGNOSIS — I10 Essential (primary) hypertension: Secondary | ICD-10-CM | POA: Diagnosis not present

## 2023-08-15 DIAGNOSIS — E139 Other specified diabetes mellitus without complications: Secondary | ICD-10-CM | POA: Diagnosis not present

## 2023-08-15 DIAGNOSIS — K7469 Other cirrhosis of liver: Secondary | ICD-10-CM | POA: Diagnosis not present

## 2023-08-16 ENCOUNTER — Ambulatory Visit: Payer: Self-pay

## 2023-08-16 DIAGNOSIS — F419 Anxiety disorder, unspecified: Secondary | ICD-10-CM | POA: Diagnosis not present

## 2023-08-16 DIAGNOSIS — R109 Unspecified abdominal pain: Secondary | ICD-10-CM | POA: Diagnosis not present

## 2023-08-16 DIAGNOSIS — R140 Abdominal distension (gaseous): Secondary | ICD-10-CM | POA: Diagnosis not present

## 2023-08-16 DIAGNOSIS — E119 Type 2 diabetes mellitus without complications: Secondary | ICD-10-CM | POA: Diagnosis not present

## 2023-08-16 DIAGNOSIS — I110 Hypertensive heart disease with heart failure: Secondary | ICD-10-CM | POA: Diagnosis not present

## 2023-08-16 DIAGNOSIS — I509 Heart failure, unspecified: Secondary | ICD-10-CM | POA: Diagnosis not present

## 2023-08-16 DIAGNOSIS — E785 Hyperlipidemia, unspecified: Secondary | ICD-10-CM | POA: Diagnosis not present

## 2023-08-16 DIAGNOSIS — R188 Other ascites: Secondary | ICD-10-CM | POA: Diagnosis not present

## 2023-08-16 NOTE — Telephone Encounter (Signed)
 Copied from CRM [id number]. Topic: Clinical - Red Word Triage >> Aug 16, 2023  4:58 PM Alessandra Bevels wrote: Red Word that prompted transfer to Nurse Triage: Patients granddaughter is calling to report that the patient ascites. Feet look purple, signs of confusion. History of cirrhosis of the liver. Mild pain in the abdomin, with a little cough.   Chief Complaint: Abdominal swelling  Symptoms: Abdominal swelling, leg swelling, increased confusion  Frequency: Constant  Disposition: [x] ED /[] Urgent Care (no appt availability in office) / [] Appointment(In office/virtual)/ []  [facility]/ [] Home Care/ [] Refused Recommended Disposition /[] [facility]/ []  Follow-up with PCP Additional Notes: Patinet's granddaughter called to report that the patient was seen in the ED 2 days ago due to abdominal swelling from her cirrhosis and had fluid drained at that time. She states that the patient is once again very swollen in the abdomen, and has had swelling in her legs and feet as well. She states that the patient also has had increased confusion. I advised that with the patient's symptoms that she should go back to the ED for further evaluation and treatment. They verbalized understanding and agreement of this plan.     Reason for Disposition  [1] MODERATE-SEVERE SWELLING of abdomen (e.g., looks very distended or swollen) AND [2] NEW-onset or much worse  Answer Assessment - Initial Assessment Questions 1. SYMPTOM: "What's the main symptom you're concerned about?" (e.g., abdomen bloating, swelling)     Swelling 2. ONSET: "When did swelling start?"     Within the last 2 days  3. SEVERITY: "How bad is the bloating or swelling?"    - BLOATING: Feels gassy or bloated. No visible swelling.     - MILD SWELLING: Feels gassy or bloated. Abdomen looks mildly distended or swollen.    - MODERATE - SEVERE SWELLING: Abdomen looks very distended or swollen.      Moderate to severe 4. ABDOMEN PAIN:  "Is  there any abdomen pain?" If Yes, ask: "How bad is the pain?"  (e.g., Scale 1-10; mild, moderate, or severe)   - NONE (0): No pain.   - MILD (1-3): Doesn't interfere with normal activities, abdomen soft and not tender to touch.    - MODERATE (4-7): Interferes with normal activities or awakens from sleep, abdomen tender to touch.    - SEVERE (8-10): Excruciating pain, doubled over, unable to do any normal activities.       Mild 5. RELIEVING AND AGGRAVATING FACTORS: "What makes it better or worse?" (e.g., certain foods, lactose, medicines)     No 6. GI HISTORY: "Do you have any history of stomach or intestine problems?" (e.g., bowel obstruction, cancer, irritable bowel)      Cirrhosis of the liver  7. CAUSE: "What do you think is causing the bloating?"      Cirrhosis of the liver, seen in the ED twice recently for the same  8. OTHER SYMPTOMS: "Do you have any other symptoms?" (e.g., belching, blood in stool, breathing difficulty, constipation, diarrhea, fever, passing gas, vomiting, weight loss, white of eyes have turned yellow)     Swelling of legs and feet, increased confusion  Protocols used: Abdomen Bloating and Swelling-A-AH

## 2023-08-17 ENCOUNTER — Telehealth

## 2023-08-17 VITALS — BP 128/82 | HR 66 | Ht 63.0 in | Wt 130.0 lb

## 2023-08-17 DIAGNOSIS — R188 Other ascites: Secondary | ICD-10-CM

## 2023-08-17 DIAGNOSIS — K7460 Unspecified cirrhosis of liver: Secondary | ICD-10-CM

## 2023-08-17 NOTE — Assessment & Plan Note (Signed)
 Cirrhosis New diagnosis of cirrhosis with recent hospitalizations from March 27th to April 1st and April 8th to April 9th. Ascites present, requiring paracentesis with removal of 3.2 liters and 2.4 liters of fluid. Liver function tests have been elevated for some time. No elevated ammonia levels, reducing the risk of hepatic encephalopathy. She is on diuretics (furosemide and spironolactone) to manage fluid retention. The goal is to prevent complications such as fluid accumulation, bleeding, and elevated ammonia levels. Explained that A TIPS procedure may be considered if fluid accumulation becomes recurrent and bothersome. - Follow up with a hepatologist for further management of cirrhosis. - Continue furosemide and spironolactone. - advised that a hepatologist may consider TIPPS procedure if fluid accumulation becomes recurrent and bothersome. - Ensure diuresis through medication and bowel movements to manage fluid and ammonia levels.  Rheumatoid Arthritis Chronic condition with ongoing pain management needs, she has been taken off of methotrexate during the recent hospitalization. She reports pain but is managing to move around the house. - Continue current pain management regimen.  Cirrhosis Follow-up Urgent follow-up with a hepatologist is needed due to rapid fluid accumulation and cirrhosis management needs. Current appointment is too far out (July). - Grandson to research and contact hepatologists in the [place] area for an urgent appointment. - Place urgent referral to identified hepatologist offices once contacted by the family. - Ensure she is on cancellation lists for earlier appointments.   no other changes in her medication regimen.

## 2023-08-17 NOTE — Progress Notes (Deleted)
 Subjective:  Patient ID: Deborah Farley, female    DOB: 19-Jan-1946  Age: 78 y.o. MRN: [id number]  No chief complaint on file.   Discussed the use of AI scribe software for clinical note transcription with the patient, who gave verbal consent to proceed.         12/29/2022   11:19 AM 10/25/2022   11:05 AM 09/13/2022    3:12 PM 09/07/2022   11:54 AM 04/05/2021    1:25 PM  Depression screen PHQ 2/9  Decreased Interest 3 2 3  0 3  Down, Depressed, Hopeless 3 3 3 3 3   PHQ - 2 Score 6 5 6 3 6   Altered sleeping 1 1 0 0 0  Tired, decreased energy 3 1 2 3 1   Change in appetite 1 0 0 0 1  Feeling bad or failure about yourself  2 2 3 3 3   Trouble concentrating 3 3 3 3 3   Moving slowly or fidgety/restless 3 2 2 2 2   Suicidal thoughts 0 0 0 1 3  PHQ-9 Score 19 14 16 15 19   Difficult doing work/chores Very difficult Very difficult Somewhat difficult Very difficult         12/29/2022   11:19 AM  Fall Risk   Falls in the past year? 1  Number falls in past yr: 1  Injury with Fall? 0  Risk for fall due to : No Fall Risks  Follow up Falls evaluation completed;Falls prevention discussed    Patient Care Team: Blane Ohara, MD as PCP - General (Family Medicine) Karie Soda, MD as Consulting Physician (General Surgery) Meryl Dare, MD (Inactive) as Consulting Physician (Gastroenterology) Revankar, Aundra Dubin, MD as Consulting Physician (Cardiology)   Review of Systems  Constitutional:  Negative for chills, fatigue and fever.  HENT:  Negative for congestion, ear pain, sinus pressure and sore throat.   Respiratory:  Negative for cough.   Cardiovascular:  Negative for chest pain.  Gastrointestinal:  Negative for abdominal pain, constipation, diarrhea, nausea and vomiting.  Genitourinary:  Negative for dysuria and frequency.  Musculoskeletal:  Negative for arthralgias, back pain and myalgias.  Neurological:  Negative for dizziness and headaches.  Psychiatric/Behavioral:  Negative for  dysphoric mood. The patient is not nervous/anxious.     Current Outpatient Medications on File Prior to Visit  Medication Sig Dispense Refill   ALPRAZolam (XANAX) 0.5 MG tablet Take 0.5 tablets (0.25 mg total) by mouth daily as needed for anxiety. 30 tablet 1   bumetanide (BUMEX) 1 MG tablet Take 1 tablet (1 mg total) by mouth daily as needed (swelling). 90 tablet 1   buPROPion (WELLBUTRIN XL) 300 MG 24 hr tablet Take 1 tablet (300 mg total) by mouth daily. 90 tablet 0   diltiazem (CARDIZEM CD) 120 MG 24 hr capsule Take 1 capsule (120 mg total) by mouth daily. 90 capsule 0   folic acid (FOLVITE) 1 MG tablet Take 1 tablet (1 mg total) by mouth daily. 90 tablet 1   latanoprost (XALATAN) 0.005 % ophthalmic solution Place 1 drop into both eyes at bedtime.     levothyroxine (SYNTHROID) 25 MCG tablet Take 1 tablet (25 mcg total) by mouth daily before breakfast. 90 tablet 0   losartan (COZAAR) 25 MG tablet Take 1 tablet (25 mg total) by mouth daily. 90 tablet 0   methotrexate (RHEUMATREX) 2.5 MG tablet Take 10 tablets by mouth every Sunday. Caution: Chemotherapy. Protect from light. 120 tablet 0   metoprolol tartrate (LOPRESSOR) 25 MG tablet  Take 1 tablet by mouth twice daily. 180 tablet 0   nitroGLYCERIN (NITROSTAT) 0.4 MG SL tablet Place 1 tablet (0.4 mg total) under the tongue every 5 (five) minutes as needed for chest pain. 25 tablet 7   ondansetron (ZOFRAN-ODT) 4 MG disintegrating tablet Take 1 tablet (4 mg total) by mouth every 8 (eight) hours as needed for vomiting or nausea. 30 tablet 1   polyethylene glycol (MIRALAX / GLYCOLAX) 17 g packet Take 17 g by mouth daily as needed for mild constipation. 14 each 3   pravastatin (PRAVACHOL) 20 MG tablet Take 1 tablet (20 mg total) by mouth daily. 90 tablet 0   traMADol (ULTRAM) 50 MG tablet Take 1 tablet (50 mg total) by mouth every 6 (six) hours as needed. 30 tablet 0   traZODone (DESYREL) 50 MG tablet Take 1 tablet by mouth at bedtime. 90 tablet 0    venlafaxine XR (EFFEXOR-XR) 150 MG 24 hr capsule TAKE 1 CAPSULE(75 MG) BY MOUTH DAILY WITH BREAKFAST 90 capsule 0   venlafaxine XR (EFFEXOR-XR) 75 MG 24 hr capsule Take 1 capsule by mouth daily with breakfast. 90 capsule 0   No current facility-administered medications on file prior to visit.   Past Medical History:  Diagnosis Date   Acid reflux 02/29/2016   Acute respiratory failure with hypoxia (HCC) 07/19/2021   Acute sinusitis 03/19/2020   Allergy    Chronic idiopathic constipation 07/02/2015   Chronic pain syndrome 11/30/2020   Chronic systolic congestive heart failure (HCC)    Constipation 07/19/2021   Coronary artery disease involving native coronary artery of native heart with unstable angina pectoris (HCC) 07/02/2015   Formatting of this note might be different from the original. Revenkar   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   Degeneration of lumbar intervertebral disc 03/02/2021   Depression    Depression, major, single episode, moderate (HCC) 08/31/2020   Diet-controlled diabetes mellitus (HCC)    Dyspnea on exertion 01/16/2019   Epigastric pain 04/08/2015   Gait disturbance 03/15/2017   Heart disease    Hiatal hernia 07/02/2015   High risk medication use 04/12/2016   Methotrexate PLQ Eye Exam: 07/28/16 WNL @ [facility] Follow up in 6 months.    History of diabetes mellitus 05/31/2016   Hyperlipidemia 02/29/2016   Hypertension    Hypokalemia 07/19/2021   Hypothyroidism    Hypoxia 07/18/2021   Incarcerated hiatal hernia s/p robotic repair 07/15/2021 07/15/2021   Left-sided chest pain 07/19/2021   Major depressive disorder, recurrent episode, moderate (HCC) 07/02/2015   Malaise and fatigue 07/02/2015   Memory difficulty    Mild protein-calorie malnutrition (HCC) 07/18/2021   Nausea and vomiting 05/17/2021   Obesity (BMI 35.0-39.9 without comorbidity) 02/29/2016   Obesity, diabetes, and hypertension syndrome (HCC)    Organoaxial gastric volvulus 05/17/2021   Pleural  effusion on left 07/19/2021   Pre-diabetes    Progressive angina (HCC) 08/11/2015   PTSD (post-traumatic stress disorder) 11/30/2020   Rheumatoid arthritis (HCC) 04/12/2016   Spastic esophagus    Suspected pulmonary embolism 03/11/2019   Thrombocytopenia (HCC) 07/18/2021   Transaminitis 07/18/2021   Past Surgical History:  Procedure Laterality Date   ABDOMINAL HYSTERECTOMY     BACK SURGERY     BALLOON DILATION  01/27/2022   Procedure: BALLOON DILATION;  Surgeon: Karie Soda, MD;  Location: [facility];  Service: General;;   CARDIAC CATHETERIZATION N/A 08/12/2015   Procedure: Left Heart Cath and Coronary Angiography;  Surgeon: Yates Decamp, MD;  Location: [facility] INVASIVE CV LAB;  Service: Cardiovascular;  Laterality: N/A;   ESOPHAGEAL MANOMETRY N/A 07/01/2021   Procedure: ESOPHAGEAL MANOMETRY (EM);  Surgeon: Meryl Dare, MD;  Location: [facility];  Service: Endoscopy;  Laterality: N/A;   ESOPHAGOGASTRODUODENOSCOPY N/A 01/27/2022   Procedure: ESOPHAGOGASTRODUODENOSCOPY (EGD);  Surgeon: Karie Soda, MD;  Location: [facility];  Service: General;  Laterality: N/A;   KENALOG INJECTION  01/27/2022   Procedure: Pauline Good INJECTION;  Surgeon: Karie Soda, MD;  Location: [facility];  Service: General;;   LEFT HEART CATH AND CORONARY ANGIOGRAPHY N/A 02/20/2018   Procedure: LEFT HEART CATH AND CORONARY ANGIOGRAPHY;  Surgeon: Lennette Bihari, MD;  Location: [facility] INVASIVE CV LAB;  Service: Cardiovascular;  Laterality: N/A;   LEFT HEART CATH AND CORONARY ANGIOGRAPHY N/A 01/19/2020   Procedure: LEFT HEART CATH AND CORONARY ANGIOGRAPHY;  Surgeon: Marykay Lex, MD;  Location: [facility] INVASIVE CV LAB;  Service: Cardiovascular;  Laterality: N/A;   TOTAL ABDOMINAL HYSTERECTOMY W/ BILATERAL SALPINGOOPHORECTOMY     XI ROBOTIC ASSISTED HIATAL HERNIA REPAIR N/A 07/15/2021   Procedure: robotic paraesophageal hiatal hernia with fundoplication, diaphragm release and mesh repair and bilateral TAP block;  Surgeon: Karie Soda, MD;  Location: [facility];  Service: General;  Laterality: N/A;    Family History  Problem Relation Age of Onset   Heart disease Mother    Diabetes Mother    Hypertension Mother        after birth   Heart disease Father    Heart attack Father 62   Hypertension Sister    Diabetes Sister    Diabetes Maternal Grandmother    Kidney disease Daughter        kidney removed after birth   Diabetes Maternal Aunt        all 7 maternal Aunts   Other Son        Adopted   Breast cancer Neg Hx    Social History   Socioeconomic History   Marital status: Married    Spouse name: Brittanyann Wittner   Number of children: 2   Years of education: 12   Highest education level: Associate degree: occupational, Scientist, product/process development, or vocational program  Occupational History   Occupation: retired  Tobacco Use   Smoking status: Never    Passive exposure: Never   Smokeless tobacco: Never  Vaping Use   Vaping status: Never Used  Substance and Sexual Activity   Alcohol use: No   Drug use: No   Sexual activity: Yes    Partners: Male  Other Topics Concern   Not on file  Social History Narrative   Not on file   Social Drivers of Health   Financial Resource Strain: Low Risk  (08/06/2023)   Received from [facility]   Overall Financial Resource Strain (CARDIA)    Difficulty of Paying Living Expenses: Not very hard  Food Insecurity: No Food Insecurity (08/06/2023)   Received from [facility]   Hunger Vital Sign    Worried About Running Out of Food in the Last Year: Never true    Ran Out of Food in the Last Year: Never true  Transportation Needs: Unmet Transportation Needs (08/06/2023)   Received from [facility]   PRAPARE - Transportation    Lack of Transportation (Medical): Yes    Lack of Transportation (Non-Medical): Yes  Physical Activity: Unknown (09/11/2022)   Exercise Vital Sign    Days of Exercise per Week: 0 days    Minutes of Exercise per Session: Not on file  Stress:  Stress  Concern Present (09/11/2022)   [employer] of Occupational Health - Occupational Stress Questionnaire    Feeling of Stress : Very much  Social Connections: Moderately Isolated (09/11/2022)   Social Connection and Isolation Panel [NHANES]    Frequency of Communication with Friends and Family: More than three times a week    Frequency of Social Gatherings with Friends and Family: Once a week    Attends Religious Services: More than 4 times per year    Active Member of [employer] or Organizations: No    Attends Banker Meetings: Not on file    Marital Status: Separated    Objective:  There were no vitals taken for this visit.     12/29/2022   11:15 AM 10/25/2022   11:28 AM 10/25/2022   11:01 AM  BP/Weight  Systolic BP 144 144 136  Diastolic BP 72 72 84  Wt. (Lbs) 139.4  153  BMI 24.69 kg/m2  27.1 kg/m2    Physical Exam  Diabetic Foot Exam - Simple   No data filed      Lab Results  Component Value Date   WBC 6.5 12/29/2022   HGB 13.1 12/29/2022   HCT 39.5 12/29/2022   PLT 161 12/29/2022   GLUCOSE 91 12/29/2022   CHOL 123 09/07/2022   TRIG 85 09/07/2022   HDL 48 09/07/2022   LDLCALC 58 09/07/2022   ALT 27 12/29/2022   AST 46 (H) 12/29/2022   NA 141 12/29/2022   K 4.9 12/29/2022   CL 106 12/29/2022   CREATININE 0.86 12/29/2022   BUN 11 12/29/2022   CO2 25 12/29/2022   TSH 3.740 09/07/2022   INR 1.1 12/29/2022   HGBA1C 5.5 09/07/2022   MICROALBUR 80 07/29/2020      Assessment & Plan:  Assessment and Plan       There are no diagnoses linked to this encounter.   No orders of the defined types were placed in this encounter.   No orders of the defined types were placed in this encounter.    Follow-up: No follow-ups on file.   I,Candice Gribble,acting as a Neurosurgeon for [employer], MD.,have documented all relevant documentation on the behalf of Windell Moment, MD,as directed by  Windell Moment, MD while in the presence of Windell Moment, MD.   An After Visit Summary was printed and given to the patient.  Windell Moment, MD [facility] [phone]
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
## 2023-08-19 ENCOUNTER — Telehealth: Payer: Self-pay | Admitting: Family Medicine

## 2023-08-19 NOTE — Telephone Encounter (Signed)
 This is concerning for Deborah Farley, her husband. I attempted to call [name], but there was no answer.  I spoke with [name], the daughter.  GrandchildrenGeraldean Klein (grand-son):  [phone] [name] (grand-daughter):  [phone].  I spoke with [name] and he is concerned about [name]'s ability to care for himself and his suicidal ideation.   Please see [name]'s chart for further discussion. Dr. Reinhold Carbine

## 2023-08-23 ENCOUNTER — Other Ambulatory Visit: Payer: Self-pay | Admitting: Family Medicine

## 2023-08-23 DIAGNOSIS — E039 Hypothyroidism, unspecified: Secondary | ICD-10-CM

## 2023-08-23 DIAGNOSIS — I10 Essential (primary) hypertension: Secondary | ICD-10-CM

## 2023-08-23 DIAGNOSIS — F332 Major depressive disorder, recurrent severe without psychotic features: Secondary | ICD-10-CM

## 2023-09-06 ENCOUNTER — Ambulatory Visit (INDEPENDENT_AMBULATORY_CARE_PROVIDER_SITE_OTHER)

## 2023-09-06 VITALS — Ht 63.0 in | Wt 130.0 lb

## 2023-09-06 DIAGNOSIS — Z0000 Encounter for general adult medical examination without abnormal findings: Secondary | ICD-10-CM | POA: Diagnosis not present

## 2023-09-06 NOTE — Patient Instructions (Signed)
 Ms. Hanse , Thank you for taking time to come for your Medicare Wellness Visit. I appreciate your ongoing commitment to your health goals. Please review the following plan we discussed and let me know if I can assist you in the future.   Referrals/Orders/Follow-Ups/Clinician Recommendations: Aim for 30 minutes of exercise or brisk walking, 6-8 glasses of water, and 5 servings of fruits and vegetables each day.  This is a list of the screening recommended for you and due dates:  Health Maintenance  Topic Date Due   Yearly kidney health urinalysis for diabetes  Never done   Pneumonia Vaccine (1 of 2 - PCV) Never done   Zoster (Shingles) Vaccine (1 of 2) Never done   DEXA scan (bone density measurement)  Never done   Eye exam for diabetics  07/20/2021   DTaP/Tdap/Td vaccine (2 - Td or Tdap) 08/28/2022   COVID-19 Vaccine (4 - 2024-25 season) 01/07/2023   Hemoglobin A1C  03/10/2023   Complete foot exam   09/07/2023   Flu Shot  12/07/2023   Yearly kidney function blood test for diabetes  12/29/2023   Medicare Annual Wellness Visit  09/05/2024   Hepatitis C Screening  Completed   HPV Vaccine  Aged Out   Meningitis B Vaccine  Aged Out    Advanced directives: (ACP Link)Information on Advanced Care Planning can be found at [place]  [employer] Advance Health Care Directives Advance Health Care Directives. [email or web address]   Next Medicare Annual Wellness Visit scheduled for next year: Yes  Have you seen your provider in the last 6 months (3 months if uncontrolled diabetes)? Yes

## 2023-09-06 NOTE — Progress Notes (Signed)
 Subjective:   Deborah Farley is a 78 y.o. who presents for a Medicare Wellness preventive visit.  Visit Complete: Virtual I connected with  Deborah Farley on 09/06/23 by a audio enabled telemedicine application and verified that I am speaking with the correct person using two identifiers.  Patient Location: Home  Provider Location: Home Office  I discussed the limitations of evaluation and management by telemedicine. The patient expressed understanding and agreed to proceed.  Vital Signs: Because this visit was a virtual/telehealth visit, some criteria may be missing or patient reported. Any vitals not documented were not able to be obtained and vitals that have been documented are patient reported.  VideoDeclined- This patient declined Librarian, academic. Therefore the visit was completed with audio only.  Persons Participating in Visit: Patient.  AWV Questionnaire: No: Patient Medicare AWV questionnaire was not completed prior to this visit.  Cardiac Risk Factors include: advanced age (>66men, >81 women);diabetes mellitus;dyslipidemia;hypertension;sedentary lifestyle     Objective:    Today's Vitals   09/06/23 1432  Weight: 130 lb (59 kg)  Height: 5\' 3"  (1.6 m)   Body mass index is 23.03 kg/m.     09/06/2023    3:16 PM 01/27/2022   10:40 AM 07/15/2021    3:00 PM 06/23/2021    1:10 PM 12/28/2020    1:18 PM 01/19/2020    7:23 AM 02/20/2018    7:34 AM  Advanced Directives  Does Patient Have a Medical Advance Directive? No Yes No Yes Yes Yes Yes  Type of Furniture conservator/restorer;Living will Healthcare Power of [employer] of [place];Living will Healthcare Power of [place];Living will Healthcare Power of [place];Living will Healthcare Power of [place];Living will  Does patient want to make changes to medical advance directive?     No - Patient declined    Copy of Healthcare Power of Attorney in Chart?  Yes -  validated most recent copy scanned in chart (See row information) No - copy requested  No - copy requested    Would patient like information on creating a medical advance directive? Yes (MAU/Ambulatory/Procedural Areas - Information given)  No - Patient declined        Current Medications (verified) Outpatient Encounter Medications as of 09/06/2023  Medication Sig   ALPRAZolam  (XANAX ) 0.5 MG tablet Take 0.5 tablets (0.25 mg total) by mouth daily as needed for anxiety.   bumetanide  (BUMEX ) 1 MG tablet Take 1 tablet (1 mg total) by mouth daily as needed (swelling).   buPROPion  (WELLBUTRIN  XL) 300 MG 24 hr tablet Take 1 tablet by mouth daily.   folic acid  (FOLVITE ) 1 MG tablet Take 1 tablet (1 mg total) by mouth daily.   furosemide  (LASIX ) 40 MG tablet Take 40 mg by mouth daily.   latanoprost  (XALATAN ) 0.005 % ophthalmic solution Place 1 drop into both eyes at bedtime.   levothyroxine  (SYNTHROID ) 25 MCG tablet Take 1 tablet by mouth daily before breakfast.   losartan  (COZAAR ) 25 MG tablet Take 1 tablet (25 mg total) by mouth daily.   metoprolol  tartrate (LOPRESSOR ) 25 MG tablet Take 1 tablet by mouth twice daily.   nitroGLYCERIN  (NITROSTAT ) 0.4 MG SL tablet Place 1 tablet (0.4 mg total) under the tongue every 5 (five) minutes as needed for chest pain.   ondansetron  (ZOFRAN -ODT) 4 MG disintegrating tablet Take 1 tablet (4 mg total) by mouth every 8 (eight) hours as needed for vomiting or nausea.   polyethylene glycol (MIRALAX  / GLYCOLAX ) 17  g packet Take 17 g by mouth daily as needed for mild constipation.   pravastatin  (PRAVACHOL ) 20 MG tablet Take 1 tablet (20 mg total) by mouth daily.   spironolactone (ALDACTONE) 25 MG tablet Take 25 mg by mouth daily.   traMADol  (ULTRAM ) 50 MG tablet Take 1 tablet (50 mg total) by mouth every 6 (six) hours as needed.   traZODone  (DESYREL ) 50 MG tablet Take 1 tablet by mouth at bedtime.   venlafaxine  XR (EFFEXOR -XR) 150 MG 24 hr capsule TAKE 1 CAPSULE(75 MG) BY  MOUTH DAILY WITH BREAKFAST   [DISCONTINUED] venlafaxine  XR (EFFEXOR -XR) 75 MG 24 hr capsule Take 1 capsule by mouth daily with breakfast.   [DISCONTINUED] diltiazem  (CARDIZEM  CD) 120 MG 24 hr capsule Take 1 capsule by mouth daily.   No facility-administered encounter medications on file as of 09/06/2023.    Allergies (verified) Codeine, Iodine, and Shellfish allergy   History: Past Medical History:  Diagnosis Date   Acid reflux 02/29/2016   Acute respiratory failure with hypoxia (HCC) 07/19/2021   Acute sinusitis 03/19/2020   Allergy    Chronic idiopathic constipation 07/02/2015   Chronic pain syndrome 11/30/2020   Chronic systolic congestive heart failure (HCC)    Constipation 07/19/2021   Coronary artery disease involving native coronary artery of native heart with unstable angina pectoris (HCC) 07/02/2015   Formatting of this note might be different from the original. Revenkar   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   Degeneration of lumbar intervertebral disc 03/02/2021   Depression    Depression, major, single episode, moderate (HCC) 08/31/2020   Diet-controlled diabetes mellitus (HCC)    Dyspnea on exertion 01/16/2019   Epigastric pain 04/08/2015   Gait disturbance 03/15/2017   Heart disease    Hiatal hernia 07/02/2015   High risk medication use 04/12/2016   Methotrexate  PLQ Eye Exam: 07/28/16 WNL @ [facility] Follow up in 6 months.    History of diabetes mellitus 05/31/2016   Hyperlipidemia 02/29/2016   Hypertension    Hypokalemia 07/19/2021   Hypothyroidism    Hypoxia 07/18/2021   Incarcerated hiatal hernia s/p robotic repair 07/15/2021 07/15/2021   Left-sided chest pain 07/19/2021   Major depressive disorder, recurrent episode, moderate (HCC) 07/02/2015   Malaise and fatigue 07/02/2015   Memory difficulty    Mild protein-calorie malnutrition (HCC) 07/18/2021   Nausea and vomiting 05/17/2021   Obesity (BMI 35.0-39.9 without comorbidity) 02/29/2016   Obesity, diabetes,  and hypertension syndrome (HCC)    Organoaxial gastric volvulus 05/17/2021   Pleural effusion on left 07/19/2021   Pre-diabetes    Progressive angina (HCC) 08/11/2015   PTSD (post-traumatic stress disorder) 11/30/2020   Rheumatoid arthritis (HCC) 04/12/2016   Spastic esophagus    Suspected pulmonary embolism 03/11/2019   Thrombocytopenia (HCC) 07/18/2021   Transaminitis 07/18/2021   Past Surgical History:  Procedure Laterality Date   ABDOMINAL HYSTERECTOMY     BACK SURGERY     BALLOON DILATION  01/27/2022   Procedure: BALLOON DILATION;  Surgeon: Candyce Champagne, MD;  Location: [facility];  Service: General;;   CARDIAC CATHETERIZATION N/A 08/12/2015   Procedure: Left Heart Cath and Coronary Angiography;  Surgeon: Knox Perl, MD;  Location: [facility] INVASIVE CV LAB;  Service: Cardiovascular;  Laterality: N/A;   ESOPHAGEAL MANOMETRY N/A 07/01/2021   Procedure: ESOPHAGEAL MANOMETRY (EM);  Surgeon: Asencion Blacksmith, MD;  Location: [facility] ENDOS[facility];  Service: Endoscopy;  Laterality: N/A;   ESOPHAGOGASTRODUODENOSCOPY N/A 01/27/2022   Procedure: ESOPHAGOGASTRODUODENOSCOPY (EGD);  Surgeon: Candyce Champagne, MD;  Location: [facility] ENDOS[facility];  Service: General;  Laterality: N/A;   KENALOG  INJECTION  01/27/2022   Procedure: KENALOG  INJECTION;  Surgeon: Candyce Champagne, MD;  Location: [facility] END[facility];  Service: General;;   LEFT HEART CATH AND CORONARY ANGIOGRAPHY N/A 02/20/2018   Procedure: LEFT HEART CATH AND CORONARY ANGIOGRAPHY;  Surgeon: Millicent Ally, MD;  Location: [facility] INVASIVE CV LAB;  Service: Cardiovascular;  Laterality: N/A;   LEFT HEART CATH AND CORONARY ANGIOGRAPHY N/A 01/19/2020   Procedure: LEFT HEART CATH AND CORONARY ANGIOGRAPHY;  Surgeon: Arleen Lacer, MD;  Location: [facility] INVASIVE CV LAB;  Service: Cardiovascular;  Laterality: N/A;   TOTAL ABDOMINAL HYSTERECTOMY W/ BILATERAL SALPINGOOPHORECTOMY     XI ROBOTIC ASSISTED HIATAL HERNIA REPAIR N/A 07/15/2021   Procedure: robotic paraesophageal hiatal hernia with  fundoplication, diaphragm release and mesh repair and bilateral TAP block;  Surgeon: Candyce Champagne, MD;  Location: [facility];  Service: General;  Laterality: N/A;   Family History  Problem Relation Age of Onset   Heart disease Mother    Diabetes Mother    Hypertension Mother        after birth   Heart disease Father    Heart attack Father 23   Hypertension Sister    Diabetes Sister    Diabetes Maternal Grandmother    Kidney disease Daughter        kidney removed after birth   Diabetes Maternal Aunt        all 7 maternal Aunts   Other Son        Adopted   Breast cancer Neg Hx    Social History   Socioeconomic History   Marital status: Married    Spouse name: Kc Palms   Number of children: 2   Years of education: 12   Highest education level: Associate degree: occupational, Scientist, product/process development, or vocational program  Occupational History   Occupation: retired  Tobacco Use   Smoking status: Never    Passive exposure: Never   Smokeless tobacco: Never  Vaping Use   Vaping status: Never Used  Substance and Sexual Activity   Alcohol use: No   Drug use: No   Sexual activity: Yes    Partners: Male  Other Topics Concern   Not on file  Social History Narrative   Not on file   Social Drivers of Health   Financial Resource Strain: Medium Risk (09/06/2023)   Overall Financial Resource Strain (CARDIA)    Difficulty of Paying Living Expenses: Somewhat hard  Food Insecurity: Food Insecurity Present (09/06/2023)   Hunger Vital Sign    Worried About Running Out of Food in the Last Year: Sometimes true    Ran Out of Food in the Last Year: Never true  Transportation Needs: No Transportation Needs (09/06/2023)   PRAPARE - Administrator, Civil Service (Medical): No    Lack of Transportation (Non-Medical): No  Recent Concern: Transportation Needs - Unmet Transportation Needs (08/06/2023)   Received from [facility] - Transportation    Lack of Transportation  (Medical): Yes    Lack of Transportation (Non-Medical): Yes  Physical Activity: Inactive (09/06/2023)   Exercise Vital Sign    Days of Exercise per Week: 0 days    Minutes of Exercise per Session: 0 min  Stress: Stress Concern Present (09/06/2023)   [employer] of Occupational Health - Occupational Stress Questionnaire    Feeling of Stress : Rather much  Social Connections: Moderately Isolated (09/06/2023)   Social Connection and Isolation Panel [NHANES]  Frequency of Communication with Friends and Family: More than three times a week    Frequency of Social Gatherings with Friends and Family: Once a week    Attends Religious Services: More than 4 times per year    Active Member of [employer] or Organizations: No    Attends Banker Meetings: Never    Marital Status: Separated    Tobacco Counseling Counseling given: Not Answered    Clinical Intake:  Pre-visit preparation completed: Yes  Pain : No/denies pain     Diabetes: Yes CBG done?: No Did pt. bring in CBG monitor from home?: No  Lab Results  Component Value Date   HGBA1C 5.5 09/07/2022   HGBA1C 5.3 06/23/2021   HGBA1C 5.5 04/05/2021     How often do you need to have someone help you when you read instructions, pamphlets, or other written materials from your doctor or pharmacy?: 1 - Never  Interpreter Needed?: No  Information entered by :: Seabron Cypress LPN   Activities of Daily Living     09/06/2023    3:16 PM 09/07/2022   11:52 AM  In your present state of health, do you have any difficulty performing the following activities:  Hearing? 0 0  Vision? 0 0  Difficulty concentrating or making decisions? 0 0  Walking or climbing stairs? 0 0  Dressing or bathing? 0 0  Doing errands, shopping? 0 0  Preparing Food and eating ? N   Using the Toilet? N   In the past six months, have you accidently leaked urine? N   Do you have problems with loss of bowel control? N   Managing your Medications? N    Managing your Finances? N   Housekeeping or managing your Housekeeping? N     Patient Care Team: Mercy Stall, MD as PCP - General (Family Medicine) Candyce Champagne, MD as Consulting Physician (General Surgery) Asencion Blacksmith, MD (Inactive) as Consulting Physician (Gastroenterology) Revankar, Micael Adas, MD as Consulting Physician (Cardiology)  Indicate any recent Medical Services you may have received from other than [facility] providers in the past year (date may be approximate).     Assessment:   This is a routine wellness examination for Deborah Farley.  Hearing/Vision screen Hearing Screening - Comments:: Denies hearing difficulties   Vision Screening - Comments:: Wears rx glasses - up to date with routine eye exams with Dr. Ardell Koller    Goals Addressed   None    Depression Screen     09/06/2023    3:11 PM 08/17/2023    9:15 AM 12/29/2022   11:19 AM 10/25/2022   11:05 AM 09/13/2022    3:12 PM 09/07/2022   11:54 AM 04/05/2021    1:25 PM  PHQ 2/9 Scores  PHQ - 2 Score 6 6 6 5 6 3 6   PHQ- 9 Score 17 19 19 14 16 15 19     Fall Risk     09/06/2023    2:42 PM 08/17/2023    9:14 AM 12/29/2022   11:19 AM 10/25/2022   11:05 AM 09/07/2022   11:52 AM  Fall Risk   Falls in the past year? 0 0 1 1 1   Number falls in past yr: 0 0 1 0 0  Injury with Fall? 0 0 0 0 0  Risk for fall due to : No Fall Risks No Fall Risks No Fall Risks History of fall(s) No Fall Risks  Follow up Falls prevention discussed;Education provided;Falls evaluation completed  Falls evaluation completed;Falls  prevention discussed Falls evaluation completed;Falls prevention discussed Falls evaluation completed    MEDICARE RISK AT HOME:  Medicare Risk at Home Any stairs in or around the home?: No If so, are there any without handrails?: No Home free of loose throw rugs in walkways, pet beds, electrical cords, etc?: Yes Adequate lighting in your home to reduce risk of falls?: Yes Life alert?: No Use of a cane, walker or w/c?:  No Grab bars in the bathroom?: Yes Shower chair or bench in shower?: Yes Elevated toilet seat or a handicapped toilet?: Yes  TIMED UP AND GO:  Was the test performed?  No  Cognitive Function: 6CIT completed        09/06/2023    3:16 PM  6CIT Screen  What Year? 0 points  What month? 0 points  What time? 0 points  Count back from 20 0 points  Months in reverse 0 points  Repeat phrase 0 points  Total Score 0 points    Immunizations Immunization History  Administered Date(s) Administered   Influenza,inj,Quad PF,6+ Mos 02/13/2017   PFIZER(Purple Top)SARS-COV-2 Vaccination 07/03/2019, 07/31/2019, 02/19/2020   Tdap 08/27/2012    Screening Tests Health Maintenance  Topic Date Due   Diabetic kidney evaluation - Urine ACR  Never done   Pneumonia Vaccine 41+ Years old (1 of 2 - PCV) Never done   Zoster Vaccines- Shingrix (1 of 2) Never done   DEXA SCAN  Never done   OPHTHALMOLOGY EXAM  07/20/2021   DTaP/Tdap/Td (2 - Td or Tdap) 08/28/2022   COVID-19 Vaccine (4 - 2024-25 season) 01/07/2023   HEMOGLOBIN A1C  03/10/2023   FOOT EXAM  09/07/2023   INFLUENZA VACCINE  12/07/2023   Diabetic kidney evaluation - eGFR measurement  12/29/2023   Medicare Annual Wellness (AWV)  09/05/2024   Hepatitis C Screening  Completed   HPV VACCINES  Aged Out   Meningococcal B Vaccine  Aged Out    Health Maintenance  Health Maintenance Due  Topic Date Due   Diabetic kidney evaluation - Urine ACR  Never done   Pneumonia Vaccine 37+ Years old (1 of 2 - PCV) Never done   Zoster Vaccines- Shingrix (1 of 2) Never done   DEXA SCAN  Never done   OPHTHALMOLOGY EXAM  07/20/2021   DTaP/Tdap/Td (2 - Td or Tdap) 08/28/2022   COVID-19 Vaccine (4 - 2024-25 season) 01/07/2023   HEMOGLOBIN A1C  03/10/2023    Additional Screening:  Vision Screening: Recommended annual ophthalmology exams for early detection of glaucoma and other disorders of the eye.  Dental Screening: Recommended annual dental exams  for proper oral hygiene  Community Resource Referral / Chronic Care Management: CRR required this visit?  No   CCM required this visit?  No     Plan:     I have personally reviewed and noted the following in the patient's chart:   Medical and social history Use of alcohol, tobacco or illicit drugs  Current medications and supplements including opioid prescriptions. Patient is not currently taking opioid prescriptions. Functional ability and status Nutritional status Physical activity Advanced directives List of other physicians Hospitalizations, surgeries, and ER visits in previous 12 months Vitals Screenings to include cognitive, depression, and falls Referrals and appointments  In addition, I have reviewed and discussed with patient certain preventive protocols, quality metrics, and best practice recommendations. A written personalized care plan for preventive services as well as general preventive health recommendations were provided to patient.     Annetta Killian,  LPN   05/13/1094   After Visit Summary: (MyChart) Due to this being a telephonic visit, the after visit summary with patients personalized plan was offered to patient via MyChart   Notes: Please refer to Routing Comments.

## 2023-09-07 DIAGNOSIS — R188 Other ascites: Secondary | ICD-10-CM | POA: Diagnosis not present

## 2023-09-07 DIAGNOSIS — K222 Esophageal obstruction: Secondary | ICD-10-CM | POA: Diagnosis not present

## 2023-09-07 DIAGNOSIS — Z7289 Other problems related to lifestyle: Secondary | ICD-10-CM | POA: Diagnosis not present

## 2023-09-07 DIAGNOSIS — M069 Rheumatoid arthritis, unspecified: Secondary | ICD-10-CM | POA: Diagnosis not present

## 2023-09-07 DIAGNOSIS — K7460 Unspecified cirrhosis of liver: Secondary | ICD-10-CM | POA: Diagnosis not present

## 2023-09-22 ENCOUNTER — Other Ambulatory Visit: Payer: Self-pay | Admitting: Family Medicine

## 2023-09-22 DIAGNOSIS — M0609 Rheumatoid arthritis without rheumatoid factor, multiple sites: Secondary | ICD-10-CM

## 2023-10-02 DIAGNOSIS — K7460 Unspecified cirrhosis of liver: Secondary | ICD-10-CM | POA: Diagnosis not present

## 2023-10-05 DIAGNOSIS — R188 Other ascites: Secondary | ICD-10-CM | POA: Diagnosis not present

## 2023-10-05 DIAGNOSIS — K7460 Unspecified cirrhosis of liver: Secondary | ICD-10-CM | POA: Diagnosis not present

## 2023-10-09 ENCOUNTER — Encounter: Payer: Self-pay | Admitting: Internal Medicine

## 2023-10-10 ENCOUNTER — Ambulatory Visit: Payer: Self-pay | Admitting: Family Medicine

## 2023-10-22 ENCOUNTER — Other Ambulatory Visit: Payer: Self-pay | Admitting: Family Medicine

## 2023-10-22 DIAGNOSIS — F332 Major depressive disorder, recurrent severe without psychotic features: Secondary | ICD-10-CM

## 2023-10-22 MED ORDER — VENLAFAXINE HCL ER 150 MG PO CP24: ORAL_CAPSULE | ORAL | 1 refills | Status: DC

## 2023-11-21 ENCOUNTER — Other Ambulatory Visit: Payer: Self-pay | Admitting: Family Medicine

## 2023-11-21 DIAGNOSIS — E039 Hypothyroidism, unspecified: Secondary | ICD-10-CM

## 2023-11-21 DIAGNOSIS — F332 Major depressive disorder, recurrent severe without psychotic features: Secondary | ICD-10-CM

## 2023-11-21 DIAGNOSIS — I10 Essential (primary) hypertension: Secondary | ICD-10-CM

## 2023-12-13 ENCOUNTER — Telehealth: Payer: Self-pay

## 2023-12-13 NOTE — Telephone Encounter (Signed)
 Copied from CRM #[id number]. Topic: Clinical - Request for Lab/Test Order >> Dec 12, 2023  2:14 PM Mia F wrote: Reason for CRM: Pt was recently diagnosed with cirrhosis of the liver and is seeing gastro. She has an appt coming up on 8/15 and was told to see her primary and have labs done before coming back. Pt wants to know if she need to be seen or if the lab order can just be submitted for her to have labs done. Please advise. Pt is scheduled for 8/8 in case an appt is needed

## 2023-12-13 NOTE — Telephone Encounter (Signed)
 Called patient to let her know that Dr Sherre would like see her on the 12/14/23.

## 2023-12-14 ENCOUNTER — Ambulatory Visit (INDEPENDENT_AMBULATORY_CARE_PROVIDER_SITE_OTHER): Admitting: Family Medicine

## 2023-12-14 ENCOUNTER — Encounter: Payer: Self-pay | Admitting: Family Medicine

## 2023-12-14 VITALS — BP 130/70 | HR 72 | Temp 97.4°F | Resp 16 | Ht 63.0 in | Wt 126.0 lb

## 2023-12-14 DIAGNOSIS — K5904 Chronic idiopathic constipation: Secondary | ICD-10-CM | POA: Diagnosis not present

## 2023-12-14 DIAGNOSIS — E039 Hypothyroidism, unspecified: Secondary | ICD-10-CM

## 2023-12-14 DIAGNOSIS — I10 Essential (primary) hypertension: Secondary | ICD-10-CM | POA: Diagnosis not present

## 2023-12-14 DIAGNOSIS — K7460 Unspecified cirrhosis of liver: Secondary | ICD-10-CM

## 2023-12-14 DIAGNOSIS — R188 Other ascites: Secondary | ICD-10-CM | POA: Diagnosis not present

## 2023-12-14 DIAGNOSIS — K449 Diaphragmatic hernia without obstruction or gangrene: Secondary | ICD-10-CM | POA: Diagnosis not present

## 2023-12-14 DIAGNOSIS — R410 Disorientation, unspecified: Secondary | ICD-10-CM | POA: Diagnosis not present

## 2023-12-14 DIAGNOSIS — R1319 Other dysphagia: Secondary | ICD-10-CM | POA: Diagnosis not present

## 2023-12-14 DIAGNOSIS — F332 Major depressive disorder, recurrent severe without psychotic features: Secondary | ICD-10-CM

## 2023-12-14 DIAGNOSIS — M8080XA Other osteoporosis with current pathological fracture, unspecified site, initial encounter for fracture: Secondary | ICD-10-CM

## 2023-12-14 MED ORDER — METOPROLOL TARTRATE 25 MG PO TABS: 25.0000 mg | ORAL_TABLET | Freq: Two times a day (BID) | ORAL | 1 refills | Status: AC

## 2023-12-14 MED ORDER — POLYETHYLENE GLYCOL 3350 17 G PO PACK: 17.0000 g | PACK | Freq: Every day | ORAL | 3 refills | Status: DC | PRN

## 2023-12-14 MED ORDER — ONDANSETRON 4 MG PO TBDP: 4.0000 mg | ORAL_TABLET | Freq: Three times a day (TID) | ORAL | 1 refills | Status: AC | PRN

## 2023-12-14 MED ORDER — FUROSEMIDE 40 MG PO TABS: 40.0000 mg | ORAL_TABLET | Freq: Every day | ORAL | 1 refills | Status: DC

## 2023-12-14 MED ORDER — LEVOTHYROXINE SODIUM 25 MCG PO TABS
25.0000 ug | ORAL_TABLET | Freq: Every day | ORAL | 1 refills | Status: DC
Start: 2023-12-14 — End: 2023-12-26

## 2023-12-14 MED ORDER — VENLAFAXINE HCL ER 75 MG PO CP24: 75.0000 mg | ORAL_CAPSULE | Freq: Every day | ORAL | 1 refills | Status: DC

## 2023-12-14 MED ORDER — SPIRONOLACTONE 25 MG PO TABS: 12.5000 mg | ORAL_TABLET | Freq: Every day | ORAL | 1 refills | Status: DC

## 2023-12-14 MED ORDER — TRAZODONE HCL 50 MG PO TABS: 50.0000 mg | ORAL_TABLET | Freq: Every day | ORAL | 1 refills | Status: DC

## 2023-12-14 MED ORDER — FOLIC ACID 1 MG PO TABS: 1.0000 mg | ORAL_TABLET | Freq: Every day | ORAL | 1 refills | Status: AC

## 2023-12-14 MED ORDER — BUPROPION HCL ER (XL) 300 MG PO TB24: 300.0000 mg | ORAL_TABLET | Freq: Every day | ORAL | 1 refills | Status: AC

## 2023-12-14 NOTE — Patient Instructions (Signed)
 VISIT SUMMARY:  Today, you had a follow-up visit to discuss your liver cirrhosis and associated symptoms, including episodes of confusion, nausea, vomiting, and peripheral neuropathy. We also reviewed your chronic constipation, depression, osteoporosis, and other health conditions.  YOUR PLAN:  LIVER CIRRHOSIS WITH HEPATIC ENCEPHALOPATHY: You have chronic liver cirrhosis with episodes of confusion due to toxin buildup from inadequate bowel movements. -Increase Miralax  dosage during confusion episodes. -Please discuss your episodes of confusion, nausea, regurgitation, and xiphoid process protrusion with your liver specialist.  CHRONIC CONSTIPATION: Your chronic constipation is contributing to your hepatic encephalopathy due to infrequent bowel movements. -Increase Miralax  dosage during confusion episodes.  HIATAL HERNIA WITH INTERMITTENT DYSPHAGIA AND REGURGITATION: You have intermittent difficulty swallowing and regurgitation.  - Please discuss with gastroenterology.  DEPRESSION: Your chronic depression has improved with venlafaxine , and you have no suicidal thoughts. Continue current medications.  OSTEOPOROSIS WITH RECENT AND REMOTE RIB FRACTURES: You have osteoporosis with recent rib fractures from minimal trauma, indicating severe bone density loss. -A bone density test will be ordered at [facility] in [facility]. -Take calcium citrate with vitamin D  600 mg twice daily.  PERIPHERAL NEUROPATHY, FEET: You have numbness and tingling in your feet, possibly related to a history of vitamin B12 deficiency. -We will check your folate level, thyroid , and vitamin B12 levels.  HYPOTHYROIDISM: Your hypothyroidism is managed with levothyroxine , but recent dizziness and confusion may require reassessment. -Your levothyroxine  25 mcg prescription will be refilled. -We will check your thyroid  function tests.  HYPERTENSION: Your hypertension is well-controlled with metoprolol .  GLAUCOMA: Your  glaucoma is managed with eye drops. -You have an upcoming ophthalmology appointment scheduled.

## 2023-12-14 NOTE — Progress Notes (Signed)
 Subjective:  Patient ID: Deborah Farley, female    DOB: Apr 02, 1946  Age: 78 y.o. MRN: [id number]  Chief Complaint  Patient presents with   Cirrhosis    Discussed the use of AI scribe software for clinical note transcription with the patient, who gave verbal consent to proceed.  History of Present Illness   Deborah Farley is a 78 year old female with liver cirrhosis who presents for follow-up regarding her liver condition and associated symptoms. Patient was admitted to [facility]. Hepatic cirrhosis: On 10/07/2023, she had US  Paracentesis W [facility] at [facility].  Neurocognitive symptoms - Episodes of confusion with forgetfulness, repeating herself, and disorientation about the days - Symptoms occur intermittently over the past two months  Gastrointestinal symptoms - Nausea and vomiting about once a week - Vomiting consists of regurgitated, unchanged food - Symptoms have persisted since hiatal hernia surgery  Peripheral neuropathy - Numbness and tingling in the feet for approximately six months  Musculoskeletal findings - Tender, protruding xiphoid process - Patient reports rib pain.  - Patient was squeezed by her family members and felt ribs crack.  Weight loss and functional status - Significant weight loss has improved mobility and mood          12/14/2023   11:34 AM 09/06/2023    3:11 PM 08/17/2023    9:15 AM 12/29/2022   11:19 AM 10/25/2022   11:05 AM  Depression screen PHQ 2/9  Decreased Interest 2 3 3 3 2   Down, Depressed, Hopeless 2 3 3 3 3   PHQ - 2 Score 4 6 6 6 5   Altered sleeping 0 3 3 1 1   Tired, decreased energy 2 3 3 3 1   Change in appetite 0 1 1 1  0  Feeling bad or failure about yourself  1 0 0 2 2  Trouble concentrating 2 2 3 3 3   Moving slowly or fidgety/restless 1 2 3 3 2   Suicidal thoughts 1 0 0 0 0  PHQ-9 Score 11 17 19 19 14   Difficult doing work/chores Extremely dIfficult   Very difficult Very difficult        12/14/2023   11:34 AM  Fall Risk   Falls  in the past year? 1  Number falls in past yr: 0  Injury with Fall? 0  Risk for fall due to : Impaired balance/gait;Impaired mobility  Follow up Education provided    Patient Care Team: Sherre Clapper, MD as PCP - General (Family Medicine) Sheldon Standing, MD as Consulting Physician (General Surgery) Aneita Gwendlyn DASEN, MD (Inactive) as Consulting Physician (Gastroenterology) Revankar, Jennifer SAUNDERS, MD as Consulting Physician (Cardiology)   Review of Systems  Constitutional:  Negative for chills, fatigue and fever.  HENT:  Negative for congestion, ear pain and sore throat.   Respiratory:  Positive for shortness of breath. Negative for cough.   Cardiovascular:  Negative for chest pain and palpitations.  Gastrointestinal:  Negative for abdominal pain, constipation, diarrhea, nausea and vomiting.  Endocrine: Negative for polydipsia, polyphagia and polyuria.  Genitourinary:  Negative for difficulty urinating and dysuria.  Musculoskeletal:  Negative for arthralgias, back pain and myalgias.  Skin:  Negative for rash.  Neurological:  Positive for dizziness. Negative for headaches.  Psychiatric/Behavioral:  Positive for confusion. Negative for dysphoric mood. The patient is not nervous/anxious.     Current Outpatient Medications on File Prior to Visit  Medication Sig Dispense Refill   ALPRAZolam  (XANAX ) 0.5 MG tablet Take 0.5 tablets (0.25 mg total) by mouth daily as needed  for anxiety. 30 tablet 1   latanoprost  (XALATAN ) 0.005 % ophthalmic solution Place 1 drop into both eyes at bedtime.     nitroGLYCERIN  (NITROSTAT ) 0.4 MG SL tablet Place 1 tablet (0.4 mg total) under the tongue every 5 (five) minutes as needed for chest pain. 25 tablet 7   traMADol  (ULTRAM ) 50 MG tablet Take 1 tablet (50 mg total) by mouth every 6 (six) hours as needed. 30 tablet 0   No current facility-administered medications on file prior to visit.   Past Medical History:  Diagnosis Date   Acid reflux 02/29/2016   Acute  respiratory failure with hypoxia (HCC) 07/19/2021   Acute sinusitis 03/19/2020   Allergy    Chronic idiopathic constipation 07/02/2015   Chronic pain syndrome 11/30/2020   Chronic systolic congestive heart failure (HCC)    Constipation 07/19/2021   Coronary artery disease involving native coronary artery of native heart with unstable angina pectoris (HCC) 07/02/2015   Formatting of this note might be different from the original. Revenkar   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   Degeneration of lumbar intervertebral disc 03/02/2021   Depression    Depression, major, single episode, moderate (HCC) 08/31/2020   Diet-controlled diabetes mellitus (HCC)    Dyspnea on exertion 01/16/2019   Epigastric pain 04/08/2015   Gait disturbance 03/15/2017   Heart disease    Hiatal hernia 07/02/2015   High risk medication use 04/12/2016   Methotrexate  PLQ Eye Exam: 07/28/16 WNL @ [facility] Follow up in 6 months.    History of diabetes mellitus 05/31/2016   Hyperlipidemia 02/29/2016   Hypertension    Hypokalemia 07/19/2021   Hypothyroidism    Hypoxia 07/18/2021   Incarcerated hiatal hernia s/p robotic repair 07/15/2021 07/15/2021   Left-sided chest pain 07/19/2021   Major depressive disorder, recurrent episode, moderate (HCC) 07/02/2015   Malaise and fatigue 07/02/2015   Memory difficulty    Mild protein-calorie malnutrition (HCC) 07/18/2021   Nausea and vomiting 05/17/2021   Obesity (BMI 35.0-39.9 without comorbidity) 02/29/2016   Obesity, diabetes, and hypertension syndrome (HCC)    Organoaxial gastric volvulus 05/17/2021   Pleural effusion on left 07/19/2021   Pre-diabetes    Progressive angina (HCC) 08/11/2015   PTSD (post-traumatic stress disorder) 11/30/2020   Rheumatoid arthritis (HCC) 04/12/2016   Spastic esophagus    Suspected pulmonary embolism 03/11/2019   Thrombocytopenia (HCC) 07/18/2021   Transaminitis 07/18/2021   Past Surgical History:  Procedure Laterality Date   ABDOMINAL  HYSTERECTOMY     BACK SURGERY     BALLOON DILATION  01/27/2022   Procedure: BALLOON DILATION;  Surgeon: Sheldon Standing, MD;  Location: [facility];  Service: General;;   CARDIAC CATHETERIZATION N/A 08/12/2015   Procedure: Left Heart Cath and Coronary Angiography;  Surgeon: Gordy Bergamo, MD;  Location: [facility] INVASIVE CV LAB;  Service: Cardiovascular;  Laterality: N/A;   ESOPHAGEAL MANOMETRY N/A 07/01/2021   Procedure: ESOPHAGEAL MANOMETRY (EM);  Surgeon: Aneita Gwendlyn DASEN, MD;  Location: [facility];  Service: Endoscopy;  Laterality: N/A;   ESOPHAGOGASTRODUODENOSCOPY N/A 01/27/2022   Procedure: ESOPHAGOGASTRODUODENOSCOPY (EGD);  Surgeon: Sheldon Standing, MD;  Location: [facility];  Service: General;  Laterality: N/A;   KENALOG  INJECTION  01/27/2022   Procedure: KENALOG  INJECTION;  Surgeon: Sheldon Standing, MD;  Location: [facility];  Service: General;;   LEFT HEART CATH AND CORONARY ANGIOGRAPHY N/A 02/20/2018   Procedure: LEFT HEART CATH AND CORONARY ANGIOGRAPHY;  Surgeon: Burnard Debby LABOR, MD;  Location: [facility] INVASIVE CV LAB;  Service: Cardiovascular;  Laterality: N/A;  LEFT HEART CATH AND CORONARY ANGIOGRAPHY N/A 01/19/2020   Procedure: LEFT HEART CATH AND CORONARY ANGIOGRAPHY;  Surgeon: Anner Alm ORN, MD;  Location: [facility] INVASIVE CV LAB;  Service: Cardiovascular;  Laterality: N/A;   TOTAL ABDOMINAL HYSTERECTOMY W/ BILATERAL SALPINGOOPHORECTOMY     XI ROBOTIC ASSISTED HIATAL HERNIA REPAIR N/A 07/15/2021   Procedure: robotic paraesophageal hiatal hernia with fundoplication, diaphragm release and mesh repair and bilateral TAP block;  Surgeon: Sheldon Standing, MD;  Location: [facility];  Service: General;  Laterality: N/A;    Family History  Problem Relation Age of Onset   Heart disease Mother    Diabetes Mother    Hypertension Mother        after birth   Heart disease Father    Heart attack Father 89   Hypertension Sister    Diabetes Sister    Diabetes Maternal Grandmother    Kidney disease Daughter         kidney removed after birth   Diabetes Maternal Aunt        all 7 maternal Aunts   Other Son        Adopted   Breast cancer Neg Hx    Social History   Socioeconomic History   Marital status: Married    Spouse name: Evangelene Vora   Number of children: 2   Years of education: 12   Highest education level: Associate degree: occupational, Scientist, product/process development, or vocational program  Occupational History   Occupation: retired  Tobacco Use   Smoking status: Never    Passive exposure: Never   Smokeless tobacco: Never  Vaping Use   Vaping status: Never Used  Substance and Sexual Activity   Alcohol use: No   Drug use: No   Sexual activity: Yes    Partners: Male  Other Topics Concern   Not on file  Social History Narrative   Not on file   Social Drivers of Health   Financial Resource Strain: Medium Risk (09/06/2023)   Overall Financial Resource Strain (CARDIA)    Difficulty of Paying Living Expenses: Somewhat hard  Food Insecurity: Food Insecurity Present (09/06/2023)   Hunger Vital Sign    Worried About Running Out of Food in the Last Year: Sometimes true    Ran Out of Food in the Last Year: Never true  Transportation Needs: No Transportation Needs (09/06/2023)   PRAPARE - Administrator, Civil Service (Medical): No    Lack of Transportation (Non-Medical): No  Recent Concern: Transportation Needs - Unmet Transportation Needs (08/06/2023)   Received from [facility] - Transportation    Lack of Transportation (Medical): Yes    Lack of Transportation (Non-Medical): Yes  Physical Activity: Inactive (09/06/2023)   Exercise Vital Sign    Days of Exercise per Week: 0 days    Minutes of Exercise per Session: 0 min  Stress: Stress Concern Present (09/06/2023)   [employer] of Occupational Health - Occupational Stress Questionnaire    Feeling of Stress : Rather much  Social Connections: Moderately Isolated (09/06/2023)   Social Connection and Isolation Panel     Frequency of Communication with Friends and Family: More than three times a week    Frequency of Social Gatherings with Friends and Family: Once a week    Attends Religious Services: More than 4 times per year    Active Member of [employer] or Organizations: No    Attends Banker Meetings: Never    Marital Status: Separated  Objective:  BP 130/70   Pulse 72   Temp (!) 97.4 F (36.3 C)   Resp 16   Ht 5' 3 (1.6 m)   Wt 126 lb (57.2 kg)   SpO2 98%   BMI 22.32 kg/m      12/14/2023   10:47 AM 09/06/2023    2:32 PM 08/17/2023    9:16 AM  BP/Weight  Systolic BP 130 -- 128  Diastolic BP 70 -- 82  Wt. (Lbs) 126 130 130  BMI 22.32 kg/m2 23.03 kg/m2 23.03 kg/m2    Physical Exam Vitals reviewed.  Constitutional:      Appearance: Normal appearance. She is normal weight.  Neck:     Vascular: No carotid bruit.  Cardiovascular:     Rate and Rhythm: Normal rate and regular rhythm.     Heart sounds: Normal heart sounds.  Pulmonary:     Effort: Pulmonary effort is normal. No respiratory distress.     Breath sounds: Normal breath sounds.  Chest:    Abdominal:     General: Abdomen is flat. Bowel sounds are normal.     Palpations: Abdomen is soft.     Tenderness: There is no abdominal tenderness.  Neurological:     Mental Status: She is alert and oriented to person, place, and time.  Psychiatric:        Mood and Affect: Mood normal.        Behavior: Behavior normal.         Lab Results  Component Value Date   WBC 6.5 12/29/2022   HGB 13.1 12/29/2022   HCT 39.5 12/29/2022   PLT 161 12/29/2022   GLUCOSE 91 12/29/2022   CHOL 123 09/07/2022   TRIG 85 09/07/2022   HDL 48 09/07/2022   LDLCALC 58 09/07/2022   ALT 27 12/29/2022   AST 46 (H) 12/29/2022   NA 141 12/29/2022   K 4.9 12/29/2022   CL 106 12/29/2022   CREATININE 0.86 12/29/2022   BUN 11 12/29/2022   CO2 25 12/29/2022   TSH 3.740 09/07/2022   INR 1.1 12/29/2022   HGBA1C 5.5 09/07/2022   MICROALBUR  80 07/29/2020      Assessment & Plan:  Cirrhosis of liver with ascites, unspecified hepatic cirrhosis type (HCC) Assessment & Plan: Chronic liver cirrhosis with episodes of confusion due to toxin buildup from inadequate bowel movements. -Increase Miralax  dosage during confusion episodes. -Please discuss your episodes of confusion, nausea, regurgitation, and xiphoid process protrusion with your liver specialist.  Orders: -     Furosemide ; Take 1 tablet (40 mg total) by mouth daily.  Dispense: 90 tablet; Refill: 1 -     Polyethylene Glycol 3350 ; Take 17 g by mouth daily as needed for mild constipation.  Dispense: 14 each; Refill: 3 -     Ondansetron ; Take 1 tablet (4 mg total) by mouth every 8 (eight) hours as needed for vomiting or nausea.  Dispense: 30 tablet; Refill: 1 -     Spironolactone ; Take 0.5 tablets (12.5 mg total) by mouth daily.  Dispense: 45 tablet; Refill: 1  Primary hypertension Assessment & Plan: Well-controlled with metoprolol .  Orders: -     Metoprolol  Tartrate; Take 1 tablet (25 mg total) by mouth 2 (two) times daily.  Dispense: 180 tablet; Refill: 1 -     Lipid panel  Acquired hypothyroidism Assessment & Plan: Managed with levothyroxine , but recent dizziness and confusion may require reassessment. -Your levothyroxine  25 mcg prescription will be refilled. -We will check your thyroid   function tests.  Orders: -     Levothyroxine  Sodium; Take 1 tablet (25 mcg total) by mouth daily before breakfast.  Dispense: 90 tablet; Refill: 1 -     TSH  Confusion Assessment & Plan: Chronic liver cirrhosis with episodes of confusion due to toxin buildup from inadequate bowel movements. -Increase Miralax  dosage during confusion episodes. -Please discuss your episodes of confusion, nausea, regurgitation, and xiphoid process protrusion with your liver specialist.  Orders: -     CBC with Differential/Platelet -     Comprehensive metabolic panel with GFR -     A87 and Folate  Panel -     Methylmalonic acid, serum  Severe episode of recurrent major depressive disorder, without psychotic features (HCC) Assessment & Plan: Chronic depression has improved with venlafaxine , and you have no suicidal thoughts.  Continue current medications.  Orders: -     buPROPion  HCl ER (XL); Take 1 tablet (300 mg total) by mouth daily.  Dispense: 90 tablet; Refill: 1 -     traZODone  HCl; Take 1 tablet (50 mg total) by mouth at bedtime.  Dispense: 90 tablet; Refill: 1 -     Venlafaxine  HCl ER; Take 1 capsule (75 mg total) by mouth daily with breakfast.  Dispense: 90 capsule; Refill: 1  Other osteoporosis with current pathological fracture, initial encounter Assessment & Plan: -A bone density test will be ordered at [facility] in [facility]. -Take calcium citrate with vitamin D  600 mg twice daily.  Orders: -     VITAMIN D  25 Hydroxy (Vit-D Deficiency, Fractures) -     DG Bone Density; Future  Chronic idiopathic constipation Assessment & Plan: chronic constipation is contributing to your hepatic encephalopathy due to infrequent bowel movements. -Increase Miralax  dosage during confusion episodes.   Hiatal hernia Assessment & Plan: Patient has intermittent difficulty swallowing and regurgitation.  - Please discuss with gastroenterology.   Other dysphagia Assessment & Plan: Patient has intermittent difficulty swallowing and regurgitation.  - Please discuss with gastroenterology.   Other orders -     Folic Acid ; Take 1 tablet (1 mg total) by mouth daily.  Dispense: 90 tablet; Refill: 1     Meds ordered this encounter  Medications   buPROPion  (WELLBUTRIN  XL) 300 MG 24 hr tablet    Sig: Take 1 tablet (300 mg total) by mouth daily.    Dispense:  90 tablet    Refill:  1   folic acid  (FOLVITE ) 1 MG tablet    Sig: Take 1 tablet (1 mg total) by mouth daily.    Dispense:  90 tablet    Refill:  1   furosemide  (LASIX ) 40 MG tablet    Sig: Take 1 tablet (40 mg total)  by mouth daily.    Dispense:  90 tablet    Refill:  1   levothyroxine  (SYNTHROID ) 25 MCG tablet    Sig: Take 1 tablet (25 mcg total) by mouth daily before breakfast.    Dispense:  90 tablet    Refill:  1   metoprolol  tartrate (LOPRESSOR ) 25 MG tablet    Sig: Take 1 tablet (25 mg total) by mouth 2 (two) times daily.    Dispense:  180 tablet    Refill:  1   polyethylene glycol (MIRALAX  / GLYCOLAX ) 17 g packet    Sig: Take 17 g by mouth daily as needed for mild constipation.    Dispense:  14 each    Refill:  3   ondansetron  (ZOFRAN -ODT) 4 MG disintegrating  tablet    Sig: Take 1 tablet (4 mg total) by mouth every 8 (eight) hours as needed for vomiting or nausea.    Dispense:  30 tablet    Refill:  1   spironolactone  (ALDACTONE ) 25 MG tablet    Sig: Take 0.5 tablets (12.5 mg total) by mouth daily.    Dispense:  45 tablet    Refill:  1   traZODone  (DESYREL ) 50 MG tablet    Sig: Take 1 tablet (50 mg total) by mouth at bedtime.    Dispense:  90 tablet    Refill:  1   venlafaxine  XR (EFFEXOR -XR) 75 MG 24 hr capsule    Sig: Take 1 capsule (75 mg total) by mouth daily with breakfast.    Dispense:  90 capsule    Refill:  1    Orders Placed This Encounter  Procedures   DG Bone Density   CBC with Differential/Platelet   Comprehensive metabolic panel with GFR   Lipid panel   TSH   B12 and Folate Panel   VITAMIN D  25 Hydroxy (Vit-D Deficiency, Fractures)   Methylmalonic acid, serum     Follow-up: Return in about 6 months (around 06/15/2024) for chronic follow up.   I,Marla I Leal-Borjas,acting as a scribe for Abigail Free, MD.,have documented all relevant documentation on the behalf of Abigail Free, MD,as directed by  Abigail Free, MD while in the presence of Abigail Free, MD.   An After Visit Summary was printed and given to the patient.  Abigail Free, MD [facility] [phone]

## 2023-12-15 DIAGNOSIS — M8000XA Age-related osteoporosis with current pathological fracture, unspecified site, initial encounter for fracture: Secondary | ICD-10-CM

## 2023-12-15 DIAGNOSIS — R410 Disorientation, unspecified: Secondary | ICD-10-CM

## 2023-12-15 HISTORY — DX: Disorientation, unspecified: R41.0

## 2023-12-15 HISTORY — DX: Age-related osteoporosis with current pathological fracture, unspecified site, initial encounter for fracture: M80.00XA

## 2023-12-15 NOTE — Assessment & Plan Note (Signed)
 Chronic liver cirrhosis with episodes of confusion due to toxin buildup from inadequate bowel movements. -Increase Miralax  dosage during confusion episodes. -Please discuss your episodes of confusion, nausea, regurgitation, and xiphoid process protrusion with your liver specialist.

## 2023-12-15 NOTE — Assessment & Plan Note (Signed)
 Managed with levothyroxine , but recent dizziness and confusion may require reassessment. -Your levothyroxine  25 mcg prescription will be refilled. -We will check your thyroid  function tests.

## 2023-12-15 NOTE — Assessment & Plan Note (Signed)
 Chronic depression has improved with venlafaxine , and you have no suicidal thoughts.  Continue current medications.

## 2023-12-15 NOTE — Assessment & Plan Note (Signed)
 Patient has intermittent difficulty swallowing and regurgitation.  - Please discuss with gastroenterology.

## 2023-12-15 NOTE — Assessment & Plan Note (Signed)
-  A bone density test will be ordered at [facility] in [facility]. -Take calcium citrate with vitamin D  600 mg twice daily.

## 2023-12-15 NOTE — Assessment & Plan Note (Signed)
 Well controlled with metoprolol

## 2023-12-15 NOTE — Assessment & Plan Note (Signed)
 chronic constipation is contributing to your hepatic encephalopathy due to infrequent bowel movements. -Increase Miralax  dosage during confusion episodes.

## 2023-12-20 ENCOUNTER — Telehealth: Payer: Self-pay | Admitting: Family Medicine

## 2023-12-20 NOTE — Telephone Encounter (Signed)
 Called patient. She requested to fax the results to Dr Pia, GI. I will fax them.

## 2023-12-20 NOTE — Progress Notes (Signed)
 [facility] [address] [place] [address]   Hepatology Visit Deborah Farley,Deborah Farley 78 y.o.female [phone] (home) [phone] (work) [address] [place] [address] Date of Birth 04/17/1946 MEDICAL RECORD [id number]  Patient Care Team: Cox, Abigail Canny, MD as PCP - General Referring Provider: Abigail Canny Cox    12/21/23   REASON FOR APPOINTMENT: Deborah Farley is a 78 y.o. female with a history of RA being seen in consultation at the request of Abigail Canny Cox for ascites. She presents to clinic today with her grandson.  ASSESSMENT:   ICD-10-CM  1. Hepatic cirrhosis, unspecified hepatic cirrhosis type, unspecified whether ascites present      K74.60  2. TIA (transient ischemic attack)  G45.9  3. Rheumatoid arthritis, involving unspecified site, unspecified whether rheumatoid factor present      M06.9  4. Other ascites  R18.8        RECOMMENDATION/PLAN:      Dr Sherre-    I had the pleasure of seeing Deborah Farley in clinic today for evaluation of her ascites. She developed new onset ascites over the past few months. Workup has revealed that she has cirrhosis. In review of her history, the most likely cause is NASH that was driven by chronic methotrexate  use. She has stopped MTX, and she has been doing better since that time from a liver standpoint. Her ascites is much better controlled on diuretic therapy and she has not required another paracentesis.  She remains on Lasix  40 mg and Aldactone  12-1/2 mg without any further edema.  She has gained a little bit of weight but this is mainly muscle mass.  Of concern she is had several episodes of visual disturbances associated with difficulty with word finding that have lasted 15 to 30 minutes at a time.  The spells have occurred multiple times over the past 3 weeks.  This does not sound typical of hepatic encephalopathy as these episodes are very limited in timeframe,  she has no true confusion but more difficulty with word finding, and she has returned to baseline fairly quickly.  I am more concerned about a neurologic event such as a TIA.  We discussed that if the symptoms happen again she should go to the emergency room for further evaluation.  I have otherwise ordered an MRI and carotid Dopplers and recommended that she start 81 mg of aspirin  in the meantime.  I will going order an ammonia today but have low suspicion that this is hepatic encephalopathy.  Otherwise we will await her other lab work from her PCP.  She is scheduled for an endoscopy next month for variceal screening as well as her dysphagia.  If she has ongoing neurologic symptoms we may need to hold off on this and await further workup.  Hopefully her MRI and carotids can be done before then.  She is also scheduled with cardiology next week.  I would recommend she be seen by rheumatology again for further managemetn of her RA.  We tried referring to [facility] however she was out of network with her insurance.  Elevated send a referral to [facility].  Of note, she has complained of some recent Reynauds type symptoms in the last few months. She also has a history of esophageal dysmotility.    ** CIRRHOSIS - likely MTX induced. Complicated by ascites. CPB. MELD 11 - MTX disontinued - further use contraidicated -  imaging UTD - EGD for variceal screening - MELD labs - viral/metabolic/autoimmune  labs  **ASCITES - improving - contineu aldactone  12.5 mg + lasix  40 mg daily - may wean pending labs  ** TIA SYMPTOMS - visual disturbances (tunnel vission, flashing lights) associated with hot flashes and difficutly with word findign - start ASA 81 mg daiy - MRI - carotid dopplers - go to ER if symptoms recur - low suspicion for hepatic encephalopathy - ammonia ordered today  **DYSPHAGIA - chronic ever since hernia repair. Able to manage with diet - continue dietary modifications - EGD pending - may  need to delay if has TIA/CVA  ** RA - chronic history. Was on MTX for 15+ years - refer to rheumatology as she has just moved to the area from [place] area    No follow-ups on file.  Orders Placed This Encounter  Procedures  . MRI Brain W Contrast  . Ammonia  . PT-INR  . Ambulatory referral to Rheumatology  . PVL Carotid Duplex Bilateral    Thank you for allowing me to participate in Nialah's care. Please do not hesitate to contact me with any questions or concerns.   Tanvir R. Pia, MD [facility] [phone] (f)       HISTORY OF PRESENT ILLNESS:  - last seen May 2025 - having episodes of hot flashes followed by visual disturbances and difficulty with word finding. Isn't getting confused, but has difficulty in getting her words out. Episodes will last about 15-30 minutes, and then she will get back to baseline, but just feel wiped out - otherwise no focal deficits with weakness. Has some b/l parethesias - no residual symptoms - last happened night before last - started about a month ago; she has had about 6-8 of these episodes in the past week - saw PCP, recommended increasing miralax  in case this was PSE - also had labs drawn - results are unknown, but they were workign on getting them faxed to me; CBC and CMP were ordered - still takign lasix  40 + aldactone  12.5  SUMMARY OF CARE:  May 2025 - Deborah Farley was hospitalized in March for new-onset swelling and underwent paracentesis, with 2.4 L of ascites removed. Methotrexate  was discontinued in the past month. She is currently on furosemide  40 mg and spironolactone  25 mg, with improved fluid status and a 20 lb weight loss since discharge. She has lost approximately 50 lb since her hiatal hernia repair in 2023. Her history includes rheumatoid arthritis, previously managed with methotrexate  for ~15 years, though rheumatology follow-up has been infrequent. There is no family history of liver disease.  RA symptoms are currently manageable with as-needed ibuprofen ; she does not use acetaminophen  regularly. She reports some numbness and discoloration in her fingers and toes. Advised to avoid MTX in future and to establish with rheumatologist.  Decreased aldaconte to 12.5 mg given some AKI.   I have personally obtained history above from a combination of Patient Interview and Medical Record   CURRENT MEDICATIONS:  Current Outpatient Medications:  .  buPROPion  (WELLBUTRIN  XL) 300 MG 24 hr tablet, Take 1 tablet (300 mg total) by mouth daily., Disp: , Rfl:  .  folic acid  (FOLVITE ) 1 MG tablet, Take 1 tablet (1 mg total) by mouth daily., Disp: , Rfl:  .  furosemide  (LASIX ) 40 MG tablet, Take 1 tablet (40 mg total) by mouth daily. New medication for ascites (in setting of cirrhosis), Disp: 30 tablet, Rfl: 11 .  latanoprost , PF, 0.005 % Drop, 1 drop into affected eye in the evening Ophthalmic Once  a day, Disp: , Rfl:  .  levothyroxine  (SYNTHROID , LEVOTHROID) 25 MCG tablet, 1 tablet (25 mcg total) daily at 0600., Disp: , Rfl:  .  metoprolol  tartrate (LOPRESSOR ) 25 MG tablet, Take 1 tablet (25 mg total) by mouth Two (2) times a day., Disp: 180 tablet, Rfl: 1 .  multivitamin capsule, Take 1 capsule by mouth daily., Disp: , Rfl:  .  nitroglycerin  (NITROSTAT ) 0.4 MG SL tablet, Place 1 tablet (0.4 mg total) under the tongue., Disp: , Rfl:  .  spironolactone  (ALDACTONE ) 25 MG tablet, TAKE 1/2 TABLET(12.5 MG) BY MOUTH DAILY, Disp: 50 tablet, Rfl: 1 .  traZODone  (DESYREL ) 50 MG tablet, Take 1 tablet (50 mg total) by mouth nightly., Disp: , Rfl:  .  venlafaxine  (EFFEXOR -XR) 75 MG 24 hr capsule, Take 1 capsule (75 mg total) by mouth daily., Disp: , Rfl:   PAST MEDICAL HISTORY: Past Medical History:  Diagnosis Date  . Anxiety   . Cataract   . CHF (congestive heart failure)       . Depression   . Diabetes mellitus       . Disease of thyroid  gland   . Fibromyalgia   . GERD (gastroesophageal reflux disease)    . Headache, tension-type   . Hyperlipidemia   . Hypertension   . Obesity   . Rheumatoid arthritis        rheumatoid  . Right fibular fracture 11/16/2016    PAST SURGICAL HISTORY: Past Surgical History:  Procedure Laterality Date  . APPENDECTOMY    . BACK SURGERY    . ESOPHAGEAL DILATION    . HYSTERECTOMY    . LUMBAR DISC SURGERY  1988  . PR XCAPSL CTRC RMVL INSJ IO LENS PROSTH W/O ECP Left 04/21/2019   Procedure: LEFT - Cataract Ext w/IOL Implant;  Surgeon: Alm Danas, MD;  Location: [facility];  Service: Ophthalmology  . PR XCAPSL CTRC RMVL INSJ IO LENS PROSTH W/O ECP Right 05/05/2019   Procedure: RIGHT - Cataract Ext w/IOL Implant;  Surgeon: Alm Danas, MD;  Location: [facility];  Service: Ophthalmology  . SPINE SURGERY  1988   Lumbar discectomy    FAMILY HISTORY: family history includes Alzheimer's disease in her mother; Cancer in her maternal aunt; Dementia in her mother; Diabetes in her sister; Early death in her father; Glaucoma in her mother; Heart attack in her father; Heart disease in her father, mother, and sister; Hypertension in her mother and sister; Kidney disease in her daughter; Seizures in her daughter; Stroke in her father.   SOCIAL HISTORY:  reports that she has never smoked. She has never used smokeless tobacco. She reports that she does not drink alcohol and does not use drugs.  ALLERGIES: Codeine, Iodine, and Shellfish containing products    ROS:  A 12 system review of systems was negative except as noted in HPI.  VITAL SIGNS: BP 130/71   Pulse 88   Temp 36.1 C (97 F)   Resp 16   Ht 160 cm (5' 2.99)   Wt 58.1 kg (128 lb)   BMI 22.68 kg/m  Body mass index is 22.68 kg/m. Wt Readings from Last 3 Encounters:  12/21/23 58.1 kg (128 lb)  09/07/23 50.8 kg (112 lb)  08/16/23 59 kg (130 lb)    PHYSICAL EXAM: Appearance: Alert, NAD. Conversant. Well appearing with normal muscle mass Abdomen: Soft nontender.  NBS. No HSM. No  Ascites Neuro: non focal. Asterixis absent; strenght 5/5 bilaterally Ext: no edema noted  DIAGNOSTIC STUDIES:  I have reviewed all pertinent diagnostic studies, including:  Labs: Lab Results  Component Value Date   WBC 4.0 09/07/2023   HGB 12.9 09/07/2023   HCT 38.6 09/07/2023   PLT 95 (L) 09/07/2023    Lab Results  Component Value Date   NA 144 10/02/2023   K 4.3 10/02/2023   CL 105 10/02/2023   CO2 28.0 10/02/2023   BUN 14 10/02/2023   CREATININE 1.19 (H) 10/02/2023   GLU 89 10/02/2023   CALCIUM 9.6 10/02/2023   MG 1.8 08/05/2023    Lab Results  Component Value Date   BILITOT 0.9 09/07/2023   PROT 7.1 09/07/2023   ALBUMIN  2.9 (L) 09/07/2023   ALT 11 09/07/2023   AST 27 09/07/2023   ALKPHOS 123 (H) 09/07/2023    Lab Results  Component Value Date   PT 13.7 (H) 09/07/2023   INR 1.20 09/07/2023   APTT 29.5 08/16/2023    Imaging:  CT A/P 08/14/23 Impression  Cirrhotic liver morphology with moderate volume ascites.     I have personally reviewed images on PACS.   GI Procedures: EGD 05/26/21 Normal per pt   I have personally reviewed reports of prior procedures as found in CareEverywhere.

## 2023-12-20 NOTE — Telephone Encounter (Unsigned)
 Copied from CRM [id number]. Topic: Clinical - Lab/Test Results >> Dec 20, 2023 11:00 AM Tiffini S wrote: Reason for CRM: Patient call for information for lab results/ she have a appointment scheduled for 12/21/23 and will need the results from [facility] for the appointment. She is asking for a call back today from a nurse at [phone].

## 2023-12-21 ENCOUNTER — Telehealth: Payer: Self-pay

## 2023-12-21 DIAGNOSIS — M069 Rheumatoid arthritis, unspecified: Secondary | ICD-10-CM | POA: Diagnosis not present

## 2023-12-21 DIAGNOSIS — R188 Other ascites: Secondary | ICD-10-CM | POA: Diagnosis not present

## 2023-12-21 DIAGNOSIS — G459 Transient cerebral ischemic attack, unspecified: Secondary | ICD-10-CM | POA: Diagnosis not present

## 2023-12-21 DIAGNOSIS — K7460 Unspecified cirrhosis of liver: Secondary | ICD-10-CM | POA: Diagnosis not present

## 2023-12-21 LAB — CBC WITH DIFFERENTIAL/PLATELET
Basophils Absolute: 0.1 x10E3/uL (ref 0.0–0.2)
Basos: 1 %
EOS (ABSOLUTE): 0.2 x10E3/uL (ref 0.0–0.4)
Eos: 2 %
Hematocrit: 39.3 % (ref 34.0–46.6)
Hemoglobin: 13.1 g/dL (ref 11.1–15.9)
Immature Grans (Abs): 0 x10E3/uL (ref 0.0–0.1)
Immature Granulocytes: 0 %
Lymphocytes Absolute: 2.2 x10E3/uL (ref 0.7–3.1)
Lymphs: 35 %
MCH: 32.7 pg (ref 26.6–33.0)
MCHC: 33.3 g/dL (ref 31.5–35.7)
MCV: 98 fL — ABNORMAL HIGH (ref 79–97)
Monocytes Absolute: 0.6 x10E3/uL (ref 0.1–0.9)
Monocytes: 9 %
Neutrophils Absolute: 3.3 x10E3/uL (ref 1.4–7.0)
Neutrophils: 53 %
Platelets: 110 x10E3/uL — ABNORMAL LOW (ref 150–450)
RBC: 4.01 x10E6/uL (ref 3.77–5.28)
RDW: 13.8 % (ref 11.7–15.4)
WBC: 6.2 x10E3/uL (ref 3.4–10.8)

## 2023-12-21 LAB — COMPREHENSIVE METABOLIC PANEL WITH GFR
ALT: 15 IU/L (ref 0–32)
AST: 29 IU/L (ref 0–40)
Albumin: 3.3 g/dL — ABNORMAL LOW (ref 3.8–4.8)
Alkaline Phosphatase: 169 IU/L — ABNORMAL HIGH (ref 44–121)
BUN/Creatinine Ratio: 12 (ref 12–28)
BUN: 13 mg/dL (ref 8–27)
Bilirubin Total: 1 mg/dL (ref 0.0–1.2)
CO2: 21 mmol/L (ref 20–29)
Calcium: 8.9 mg/dL (ref 8.7–10.3)
Chloride: 106 mmol/L (ref 96–106)
Creatinine, Ser: 1.09 mg/dL — ABNORMAL HIGH (ref 0.57–1.00)
Globulin, Total: 3.4 g/dL (ref 1.5–4.5)
Glucose: 85 mg/dL (ref 70–99)
Potassium: 3.5 mmol/L (ref 3.5–5.2)
Sodium: 142 mmol/L (ref 134–144)
Total Protein: 6.7 g/dL (ref 6.0–8.5)
eGFR: 52 mL/min/1.73 — ABNORMAL LOW (ref >59)

## 2023-12-21 LAB — LIPID PANEL
Chol/HDL Ratio: 2.2 ratio (ref 0.0–4.4)
Cholesterol, Total: 146 mg/dL (ref 100–199)
HDL: 65 mg/dL (ref >39)
LDL Chol Calc (NIH): 67 mg/dL (ref 0–99)
Triglycerides: 69 mg/dL (ref 0–149)
VLDL Cholesterol Cal: 14 mg/dL (ref 5–40)

## 2023-12-21 LAB — VITAMIN D 25 HYDROXY (VIT D DEFICIENCY, FRACTURES): Vit D, 25-Hydroxy: 25.1 ng/mL — ABNORMAL LOW (ref 30.0–100.0)

## 2023-12-21 LAB — B12 AND FOLATE PANEL
Folate: 10.8 ng/mL (ref >3.0)
Vitamin B-12: 300 pg/mL (ref 232–1245)

## 2023-12-21 LAB — METHYLMALONIC ACID, SERUM: Methylmalonic Acid: 727 nmol/L — ABNORMAL HIGH (ref 0–378)

## 2023-12-21 LAB — TSH: TSH: 4.03 u[IU]/mL (ref 0.450–4.500)

## 2023-12-21 NOTE — Telephone Encounter (Signed)
 Lauren called patient to give the results. Unable to reach patient. Voicemail is full.  Copied from CRM [id number]. Topic: Clinical - Lab/Test Results >> Dec 21, 2023 10:51 AM Nathanel BROCKS wrote: Reason for CRM: pt called and is needing lab results. Please call her at your earliest convience.

## 2023-12-21 NOTE — Patient Instructions (Signed)
-   Start aspirin  81 mg  - If you have another episode of the visual changes and hot flashes - go to the ER - I have ordered an MRI and neck ultrasound - I have ordered some additional labs today - please have your PCP send me a copy of your labs from last week - continue your spironolactone  and lasix  for now - we may change it based on your labwork - I have referred you to [facility] rheumatology

## 2023-12-21 NOTE — Progress Notes (Signed)
 Labs to be completed today at a [facility] lab.  [facility] map given to patient, aware that [facility] will call to schedule her MRI.  Aware [facility] will call to schedule her rheumatology consult.  Aware [facility] will call her to schedule carotid doppler study.  3 month follow up scheduled.

## 2023-12-23 DIAGNOSIS — G9389 Other specified disorders of brain: Secondary | ICD-10-CM | POA: Diagnosis not present

## 2023-12-25 ENCOUNTER — Other Ambulatory Visit: Payer: Self-pay

## 2023-12-25 DIAGNOSIS — F32A Depression, unspecified: Secondary | ICD-10-CM | POA: Insufficient documentation

## 2023-12-26 ENCOUNTER — Encounter: Payer: Self-pay | Admitting: Cardiology

## 2023-12-26 ENCOUNTER — Ambulatory Visit: Payer: Self-pay | Admitting: Family Medicine

## 2023-12-26 ENCOUNTER — Ambulatory Visit: Attending: Cardiology | Admitting: Cardiology

## 2023-12-26 ENCOUNTER — Other Ambulatory Visit: Payer: Self-pay

## 2023-12-26 VITALS — BP 132/80 | HR 78 | Ht 63.0 in | Wt 126.0 lb

## 2023-12-26 DIAGNOSIS — E782 Mixed hyperlipidemia: Secondary | ICD-10-CM

## 2023-12-26 DIAGNOSIS — Q245 Malformation of coronary vessels: Secondary | ICD-10-CM | POA: Diagnosis not present

## 2023-12-26 DIAGNOSIS — I152 Hypertension secondary to endocrine disorders: Secondary | ICD-10-CM

## 2023-12-26 DIAGNOSIS — I2510 Atherosclerotic heart disease of native coronary artery without angina pectoris: Secondary | ICD-10-CM

## 2023-12-26 DIAGNOSIS — E1159 Type 2 diabetes mellitus with other circulatory complications: Secondary | ICD-10-CM

## 2023-12-26 DIAGNOSIS — E669 Obesity, unspecified: Secondary | ICD-10-CM

## 2023-12-26 DIAGNOSIS — E1169 Type 2 diabetes mellitus with other specified complication: Secondary | ICD-10-CM

## 2023-12-26 MED ORDER — LEVOTHYROXINE SODIUM 50 MCG PO TABS: 50.0000 ug | ORAL_TABLET | Freq: Every day | ORAL | 3 refills | Status: AC

## 2023-12-26 MED ORDER — VITAMIN D (ERGOCALCIFEROL) 1.25 MG (50000 UNIT) PO CAPS: 50000.0000 [IU] | ORAL_CAPSULE | ORAL | 0 refills | Status: DC

## 2023-12-26 NOTE — Progress Notes (Signed)
 Cardiology Office Note:    Date:  12/26/2023   ID:  Deborah Farley, DOB 1945/05/11, MRN [id number]  PCP:  Sherre Clapper, MD  Cardiologist:  Jennifer JONELLE Crape, MD   Referring MD: Sherre Clapper, MD    ASSESSMENT:    1. Mixed hyperlipidemia   2. Coronary-myocardial bridge   3. Mild CAD   4. Obesity, diabetes, and hypertension syndrome (HCC)    PLAN:    In order of problems listed above:  Coronary artery disease: Secondary prevention stressed with the patient.  Importance of compliance with diet medication stressed and patient verbalized standing.  She was advised to walk to the best of her ability on a regular basis. Essential hypertension: Blood pressure is stable and diet was emphasized.  Lifestyle modification urged. Mixed dyslipidemia: Not on lipid-lowering medications because of liver issues.  She is keen on not taking any more medications that she needs.  I respect her wishes. Cirrhosis: This is managed by primary care and specialist. Patient will be seen in follow-up appointment in 6 months or earlier if the patient has any concerns.    Medication Adjustments/Labs and Tests Ordered: Current medicines are reviewed at length with the patient today.  Concerns regarding medicines are outlined above.  Orders Placed This Encounter  Procedures   EKG 12-Lead   No orders of the defined types were placed in this encounter.    No chief complaint on file.    History of Present Illness:    Deborah Farley is a 78 y.o. female.  Patient has past medical history of mild coronary artery disease, myocardial bridge, mixed dyslipidemia and now she mentions to me that she has been diagnosed with nonalcoholic liver cirrhosis.  She denies any chest pain orthopnea or PND.  She takes care of activities of daily living.  At the time of my evaluation, the patient is alert awake oriented and in no distress.  Past Medical History:  Diagnosis Date   Acid reflux 02/29/2016   Chronic idiopathic  constipation 07/02/2015   Chronic pain syndrome 11/30/2020   Chronic systolic congestive heart failure (HCC)    Cirrhosis of liver with ascites (HCC) 12/30/2022   Confusion 12/15/2023   Constipation 07/19/2021   Coronary artery disease involving native coronary artery of native heart with unstable angina pectoris (HCC) 07/02/2015   Formatting of this note might be different from the original. [place]   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   Degeneration of lumbar intervertebral disc 03/02/2021   Depression    Depression, major, single episode, moderate (HCC) 08/31/2020   Diet-controlled diabetes mellitus (HCC)    Dysphagia 04/08/2015   Dyspnea on exertion 01/16/2019   Gait disturbance 03/15/2017   Heart disease    Hiatal hernia 07/02/2015   High risk medication use 04/12/2016   Methotrexate  PLQ Eye Exam: 07/28/16 WNL @ [facility] Follow up in 6 months.    History of diabetes mellitus 05/31/2016   History of repair of hiatal hernia 11/14/2021   Hyperlipidemia 02/29/2016   Hypertension    Hypokalemia 07/19/2021   Hypothyroidism    Hypoxia 07/18/2021   Incarcerated hiatal hernia s/p robotic repair 07/15/2021 07/15/2021   Left-sided chest pain 07/19/2021   Major depressive disorder, recurrent episode, moderate (HCC) 07/02/2015   Malaise and fatigue 07/02/2015   Memory difficulty    Mild CAD 08/31/2021   Mild protein-calorie malnutrition (HCC) 07/18/2021   Nausea and vomiting 05/17/2021   Obesity (BMI 35.0-39.9 without comorbidity) 02/29/2016   Obesity, diabetes, and hypertension syndrome (HCC)  Organoaxial gastric volvulus 05/17/2021   Osteoporosis with current pathological fracture 12/15/2023   Other chest pain 07/19/2021   Pleural effusion on left 07/19/2021   Prediabetes 05/31/2016   Progressive angina (HCC) 08/11/2015   PTSD (post-traumatic stress disorder) 11/30/2020   Recurrent major depressive episodes, moderate (HCC) 08/31/2020   Rheumatoid arthritis (HCC)  04/12/2016   Severe episode of recurrent major depressive disorder, without psychotic features (HCC) 02/17/2021   Spastic esophagus    Suspected pulmonary embolism 03/11/2019   Thrombocytopenia (HCC) 07/18/2021   Transaminitis 07/18/2021    Past Surgical History:  Procedure Laterality Date   ABDOMINAL HYSTERECTOMY     BACK SURGERY     BALLOON DILATION  01/27/2022   Procedure: BALLOON DILATION;  Surgeon: Sheldon Standing, MD;  Location: [facility];  Service: General;;   CARDIAC CATHETERIZATION N/A 08/12/2015   Procedure: Left Heart Cath and Coronary Angiography;  Surgeon: Gordy Bergamo, MD;  Location: [facility] INVASIVE CV LAB;  Service: Cardiovascular;  Laterality: N/A;   ESOPHAGEAL MANOMETRY N/A 07/01/2021   Procedure: ESOPHAGEAL MANOMETRY (EM);  Surgeon: Aneita Gwendlyn DASEN, MD;  Location: [facility];  Service: Endoscopy;  Laterality: N/A;   ESOPHAGOGASTRODUODENOSCOPY N/A 01/27/2022   Procedure: ESOPHAGOGASTRODUODENOSCOPY (EGD);  Surgeon: Sheldon Standing, MD;  Location: [facility];  Service: General;  Laterality: N/A;   KENALOG  INJECTION  01/27/2022   Procedure: KENALOG  INJECTION;  Surgeon: Sheldon Standing, MD;  Location: [facility];  Service: General;;   LEFT HEART CATH AND CORONARY ANGIOGRAPHY N/A 02/20/2018   Procedure: LEFT HEART CATH AND CORONARY ANGIOGRAPHY;  Surgeon: Burnard Debby LABOR, MD;  Location: [facility] INVASIVE CV LAB;  Service: Cardiovascular;  Laterality: N/A;   LEFT HEART CATH AND CORONARY ANGIOGRAPHY N/A 01/19/2020   Procedure: LEFT HEART CATH AND CORONARY ANGIOGRAPHY;  Surgeon: Anner Alm ORN, MD;  Location: [facility] INVASIVE CV LAB;  Service: Cardiovascular;  Laterality: N/A;   TOTAL ABDOMINAL HYSTERECTOMY W/ BILATERAL SALPINGOOPHORECTOMY     XI ROBOTIC ASSISTED HIATAL HERNIA REPAIR N/A 07/15/2021   Procedure: robotic paraesophageal hiatal hernia with fundoplication, diaphragm release and mesh repair and bilateral TAP block;  Surgeon: Sheldon Standing, MD;  Location: [facility];  Service: General;   Laterality: N/A;    Current Medications: Current Meds  Medication Sig   buPROPion  (WELLBUTRIN  XL) 300 MG 24 hr tablet Take 1 tablet (300 mg total) by mouth daily.   folic acid  (FOLVITE ) 1 MG tablet Take 1 tablet (1 mg total) by mouth daily.   furosemide  (LASIX ) 40 MG tablet Take 1 tablet (40 mg total) by mouth daily.   latanoprost  (XALATAN ) 0.005 % ophthalmic solution Place 1 drop into both eyes at bedtime.   levothyroxine  (SYNTHROID ) 50 MCG tablet Take 1 tablet (50 mcg total) by mouth daily.   metoprolol  tartrate (LOPRESSOR ) 25 MG tablet Take 1 tablet (25 mg total) by mouth 2 (two) times daily.   nitroGLYCERIN  (NITROSTAT ) 0.4 MG SL tablet Place 1 tablet (0.4 mg total) under the tongue every 5 (five) minutes as needed for chest pain.   ondansetron  (ZOFRAN -ODT) 4 MG disintegrating tablet Take 1 tablet (4 mg total) by mouth every 8 (eight) hours as needed for vomiting or nausea.   polyethylene glycol (MIRALAX  / GLYCOLAX ) 17 g packet Take 17 g by mouth daily as needed for mild constipation.   spironolactone  (ALDACTONE ) 25 MG tablet Take 0.5 tablets (12.5 mg total) by mouth daily.   traMADol  (ULTRAM ) 50 MG tablet Take 1 tablet (50 mg total) by mouth every 6 (six) hours as needed.   traZODone  (  DESYREL ) 50 MG tablet Take 1 tablet (50 mg total) by mouth at bedtime.   venlafaxine  XR (EFFEXOR -XR) 75 MG 24 hr capsule Take 1 capsule (75 mg total) by mouth daily with breakfast.   Vitamin D , Ergocalciferol , (DRISDOL ) 1.25 MG (50000 UNIT) CAPS capsule Take 1 capsule (50,000 Units total) by mouth every 7 (seven) days.     Allergies:   Codeine, Iodine, and Shellfish allergy   Social History   Socioeconomic History   Marital status: Married    Spouse name: Artesia Berkey   Number of children: 2   Years of education: 12   Highest education level: Associate degree: occupational, Scientist, product/process development, or vocational program  Occupational History   Occupation: retired  Tobacco Use   Smoking status: Never     Passive exposure: Never   Smokeless tobacco: Never  Vaping Use   Vaping status: Never Used  Substance and Sexual Activity   Alcohol use: No   Drug use: No   Sexual activity: Yes    Partners: Male  Other Topics Concern   Not on file  Social History Narrative   Not on file   Social Drivers of Health   Financial Resource Strain: Medium Risk (09/06/2023)   Overall Financial Resource Strain (CARDIA)    Difficulty of Paying Living Expenses: Somewhat hard  Food Insecurity: Food Insecurity Present (09/06/2023)   Hunger Vital Sign    Worried About Running Out of Food in the Last Year: Sometimes true    Ran Out of Food in the Last Year: Never true  Transportation Needs: No Transportation Needs (09/06/2023)   PRAPARE - Administrator, Civil Service (Medical): No    Lack of Transportation (Non-Medical): No  Recent Concern: Transportation Needs - Unmet Transportation Needs (08/06/2023)   Received from [facility] - Transportation    Lack of Transportation (Medical): Yes    Lack of Transportation (Non-Medical): Yes  Physical Activity: Inactive (09/06/2023)   Exercise Vital Sign    Days of Exercise per Week: 0 days    Minutes of Exercise per Session: 0 min  Stress: Stress Concern Present (09/06/2023)   [employer] of Occupational Health - Occupational Stress Questionnaire    Feeling of Stress : Rather much  Social Connections: Moderately Isolated (09/06/2023)   Social Connection and Isolation Panel    Frequency of Communication with Friends and Family: More than three times a week    Frequency of Social Gatherings with Friends and Family: Once a week    Attends Religious Services: More than 4 times per year    Active Member of [employer] or Organizations: No    Attends Banker Meetings: Never    Marital Status: Separated     Family History: The patient's family history includes Diabetes in her maternal aunt, maternal grandmother, mother, and sister; Heart  attack (age of onset: 63) in her father; Heart disease in her father and mother; Hypertension in her mother and sister; Kidney disease in her daughter; Other in her son. There is no history of Breast cancer.  ROS:   Please see the history of present illness.    All other systems reviewed and are negative.  EKGs/Labs/Other Studies Reviewed:    The following studies were reviewed today: .SABRAEKG Interpretation Date/Time:  Wednesday December 26 2023 16:29:13 EDT Ventricular Rate:  78 PR Interval:  146 QRS Duration:  72 QT Interval:  400 QTC Calculation: 456 R Axis:   55  Text Interpretation: Normal sinus  rhythm with sinus arrhythmia Normal ECG When compared with ECG of 12-Aug-2015 14:37, No significant change was found Confirmed by Edwyna Backers [phone]) on 12/26/2023 4:43:44 PM     Recent Labs: 12/14/2023: ALT 15; BUN 13; Creatinine, Ser 1.09; Hemoglobin 13.1; Platelets 110; Potassium 3.5; Sodium 142; TSH 4.030  Recent Lipid Panel    Component Value Date/Time   CHOL 146 12/14/2023 1155   TRIG 69 12/14/2023 1155   HDL 65 12/14/2023 1155   CHOLHDL 2.2 12/14/2023 1155   LDLCALC 67 12/14/2023 1155    Physical Exam:    VS:  BP 132/80   Pulse 78   Ht 5' 3 (1.6 m)   Wt 126 lb (57.2 kg)   SpO2 97%   BMI 22.32 kg/m     Wt Readings from Last 3 Encounters:  12/26/23 126 lb (57.2 kg)  12/14/23 126 lb (57.2 kg)  09/06/23 130 lb (59 kg)     GEN: Patient is in no acute distress HEENT: Normal NECK: No JVD; No carotid bruits LYMPHATICS: No lymphadenopathy CARDIAC: Hear sounds regular, 2/6 systolic murmur at the apex. RESPIRATORY:  Clear to auscultation without rales, wheezing or rhonchi  ABDOMEN: Soft, non-tender, non-distended MUSCULOSKELETAL:  No edema; No deformity  SKIN: Warm and dry NEUROLOGIC:  Alert and oriented x 3 PSYCHIATRIC:  Normal affect   Signed, Backers JONELLE Edwyna, MD  12/26/2023 4:45 PM    [facility]

## 2023-12-26 NOTE — Patient Instructions (Signed)

## 2024-01-05 ENCOUNTER — Other Ambulatory Visit: Payer: Self-pay | Admitting: Family Medicine

## 2024-01-05 DIAGNOSIS — K7460 Unspecified cirrhosis of liver: Secondary | ICD-10-CM

## 2024-01-11 DIAGNOSIS — I110 Hypertensive heart disease with heart failure: Secondary | ICD-10-CM | POA: Diagnosis not present

## 2024-01-11 DIAGNOSIS — M069 Rheumatoid arthritis, unspecified: Secondary | ICD-10-CM | POA: Diagnosis not present

## 2024-01-11 DIAGNOSIS — I509 Heart failure, unspecified: Secondary | ICD-10-CM | POA: Diagnosis not present

## 2024-01-11 DIAGNOSIS — K7460 Unspecified cirrhosis of liver: Secondary | ICD-10-CM | POA: Diagnosis not present

## 2024-01-11 DIAGNOSIS — Z8673 Personal history of transient ischemic attack (TIA), and cerebral infarction without residual deficits: Secondary | ICD-10-CM | POA: Diagnosis not present

## 2024-01-11 DIAGNOSIS — Z1289 Encounter for screening for malignant neoplasm of other sites: Secondary | ICD-10-CM | POA: Diagnosis not present

## 2024-01-11 DIAGNOSIS — K2950 Unspecified chronic gastritis without bleeding: Secondary | ICD-10-CM | POA: Diagnosis not present

## 2024-01-11 DIAGNOSIS — K766 Portal hypertension: Secondary | ICD-10-CM | POA: Diagnosis not present

## 2024-01-11 DIAGNOSIS — Z13810 Encounter for screening for upper gastrointestinal disorder: Secondary | ICD-10-CM | POA: Diagnosis not present

## 2024-01-11 DIAGNOSIS — E079 Disorder of thyroid, unspecified: Secondary | ICD-10-CM | POA: Diagnosis not present

## 2024-01-11 DIAGNOSIS — Z7982 Long term (current) use of aspirin: Secondary | ICD-10-CM | POA: Diagnosis not present

## 2024-01-11 DIAGNOSIS — K219 Gastro-esophageal reflux disease without esophagitis: Secondary | ICD-10-CM | POA: Diagnosis not present

## 2024-01-11 DIAGNOSIS — I10 Essential (primary) hypertension: Secondary | ICD-10-CM | POA: Diagnosis not present

## 2024-01-11 DIAGNOSIS — K2970 Gastritis, unspecified, without bleeding: Secondary | ICD-10-CM | POA: Diagnosis not present

## 2024-01-11 DIAGNOSIS — E785 Hyperlipidemia, unspecified: Secondary | ICD-10-CM | POA: Diagnosis not present

## 2024-01-11 DIAGNOSIS — K3189 Other diseases of stomach and duodenum: Secondary | ICD-10-CM | POA: Diagnosis not present

## 2024-01-11 DIAGNOSIS — M797 Fibromyalgia: Secondary | ICD-10-CM | POA: Diagnosis not present

## 2024-01-11 DIAGNOSIS — G459 Transient cerebral ischemic attack, unspecified: Secondary | ICD-10-CM | POA: Diagnosis not present

## 2024-01-11 DIAGNOSIS — R1310 Dysphagia, unspecified: Secondary | ICD-10-CM | POA: Diagnosis not present

## 2024-01-11 DIAGNOSIS — E119 Type 2 diabetes mellitus without complications: Secondary | ICD-10-CM | POA: Diagnosis not present

## 2024-02-20 ENCOUNTER — Other Ambulatory Visit: Payer: Self-pay | Admitting: Family Medicine

## 2024-02-20 DIAGNOSIS — G459 Transient cerebral ischemic attack, unspecified: Secondary | ICD-10-CM | POA: Diagnosis not present

## 2024-02-22 ENCOUNTER — Encounter: Payer: Self-pay | Admitting: Internal Medicine

## 2024-02-24 ENCOUNTER — Ambulatory Visit: Payer: Self-pay | Admitting: Family Medicine

## 2024-03-08 NOTE — Progress Notes (Signed)
 "  Subjective:  Patient ID: Deborah Farley, female    DOB: [birth year]  Age: 78 y.o. MRN: [id number]  Chief Complaint  Patient presents with   Medical Management of Chronic Issues    40M    HPI: Discussed the use of AI scribe software for clinical note transcription with the patient, who gave verbal consent to proceed.  History of Present Illness Deborah Farley is a 78 year old female with depression and liver disease who presents with episodes of confusion and disorientation.  Episodes of confusion and disorientation - Episodes characterized by feeling shaky, jittery, and hot, followed by disorientation and sometimes babbling - Three episodes in the past week, each lasting 45 minutes to six hours - One episode resulted in significant disorientation - Post-episode fatigue with desire to sleep - Confusion and difficulty finding words, described as worse than ever before - No headaches - MRI of the brain in August, but episodes occurred after mri.  - Recent carotid ultrasound showed no significant blockages  Depressive symptoms - Long-standing depression, previously more severe - Currently taking Wellbutrin  and Effexor  with noted improvement in symptoms compared to previous years - Desire to reduce medication but concerned about potential worsening of depression - No thoughts of self-harm or suicidal ideation recently  Liver disease and related management - History of liver disease, previously treated with long-term methotrexate  for rheumatoid arthritis - Methotrexate  discontinued with mild improvement in condition - Currently taking spironolactone  12.5 mg daily for liver condition - Scheduled to see liver specialist soon - Uses Miralax  once daily to maintain regular bowel movements due to liver condition  Rheumatoid arthritis - History of rheumatoid arthritis, previously treated with methotrexate  - Not currently taking tramadol  - Rheumatoid arthritis is under  control  Cardiovascular and other chronic medications - Metoprolol  25 mg twice daily - Lasix  40 mg once daily - Vitamin D  once weekly - No nitroglycerin  use in the past year       03/10/2024    1:30 PM 12/14/2023   11:34 AM 09/06/2023    3:11 PM 08/17/2023    9:15 AM 12/29/2022   11:19 AM  Depression screen PHQ 2/9  Decreased Interest 2 2 3 3 3   Down, Depressed, Hopeless 1 2 3 3 3   PHQ - 2 Score 3 4 6 6 6   Altered sleeping 0 0 3 3 1   Tired, decreased energy 2 2 3 3 3   Change in appetite 0 0 1 1 1   Feeling bad or failure about yourself  1 1 0 0 2  Trouble concentrating 2 2 2 3 3   Moving slowly or fidgety/restless 1 1 2 3 3   Suicidal thoughts 0 1 0 0 0  PHQ-9 Score 9 11 17 19 19   Difficult doing work/chores Somewhat difficult Extremely dIfficult   Very difficult        12/14/2023   11:34 AM  Fall Risk   Falls in the past year? 1  Number falls in past yr: 0  Injury with Fall? 0  Risk for fall due to : Impaired balance/gait;Impaired mobility  Follow up Education provided    Patient Care Team: Sherre Clapper, MD as PCP - General (Family Medicine) Sheldon Standing, MD as Consulting Physician (General Surgery) Aneita Gwendlyn DASEN, MD (Inactive) as Consulting Physician (Gastroenterology) Revankar, Jennifer SAUNDERS, MD as Consulting Physician (Cardiology)   Review of Systems  All other systems reviewed and are negative.   Current Outpatient Medications on File Prior to Visit  Medication Sig Dispense Refill  buPROPion  (WELLBUTRIN  XL) 300 MG 24 hr tablet Take 1 tablet (300 mg total) by mouth daily. 90 tablet 1   folic acid  (FOLVITE ) 1 MG tablet Take 1 tablet (1 mg total) by mouth daily. 90 tablet 1   furosemide  (LASIX ) 40 MG tablet Take 1 tablet (40 mg total) by mouth daily. 90 tablet 1   latanoprost  (XALATAN ) 0.005 % ophthalmic solution Place 1 drop into both eyes at bedtime.     levothyroxine  (SYNTHROID ) 50 MCG tablet Take 1 tablet (50 mcg total) by mouth daily. 90 tablet 3   metoprolol   tartrate (LOPRESSOR ) 25 MG tablet Take 1 tablet (25 mg total) by mouth 2 (two) times daily. 180 tablet 1   nitroGLYCERIN  (NITROSTAT ) 0.4 MG SL tablet Place 1 tablet (0.4 mg total) under the tongue every 5 (five) minutes as needed for chest pain. 25 tablet 7   ondansetron  (ZOFRAN -ODT) 4 MG disintegrating tablet Take 1 tablet (4 mg total) by mouth every 8 (eight) hours as needed for vomiting or nausea. 30 tablet 1   spironolactone  (ALDACTONE ) 25 MG tablet Take 0.5 tablets (12.5 mg total) by mouth daily. 45 tablet 1   venlafaxine  XR (EFFEXOR -XR) 75 MG 24 hr capsule Take 1 capsule (75 mg total) by mouth daily with breakfast. 90 capsule 1   Vitamin D , Ergocalciferol , (DRISDOL ) 1.25 MG (50000 UNIT) CAPS capsule Take 1 capsule by mouth every 7 days. 12 capsule 0   Polyethylene Glycol 3350  (PEG 3350 ) 17 g PACK Dissolve 1 packet (17 grams) in liquid and drink daily as needed for mild constipation. 14 each 2   No current facility-administered medications on file prior to visit.   Past Medical History:  Diagnosis Date   Acid reflux 02/29/2016   Chronic idiopathic constipation 07/02/2015   Chronic pain syndrome 11/30/2020   Chronic systolic congestive heart failure (HCC)    Cirrhosis of liver with ascites (HCC) 12/30/2022   Confusion 12/15/2023   Constipation 07/19/2021   Coronary artery disease involving native coronary artery of native heart with unstable angina pectoris (HCC) 07/02/2015   Formatting of this note might be different from the original. Revenkar   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   Degeneration of lumbar intervertebral disc 03/02/2021   Depression    Depression, major, single episode, moderate (HCC) 08/31/2020   Diet-controlled diabetes mellitus (HCC)    Dysphagia 04/08/2015   Dyspnea on exertion 01/16/2019   Gait disturbance 03/15/2017   Heart disease    Hiatal hernia 07/02/2015   High risk medication use 04/12/2016   Methotrexate  PLQ Eye Exam: 07/28/16 WNL @ [facility]  Follow up in 6 months.    History of diabetes mellitus 05/31/2016   History of repair of hiatal hernia 11/14/2021   Hyperlipidemia 02/29/2016   Hypertension    Hypokalemia 07/19/2021   Hypothyroidism    Hypoxia 07/18/2021   Incarcerated hiatal hernia s/p robotic repair 07/15/2021 07/15/2021   Left-sided chest pain 07/19/2021   Major depressive disorder, recurrent episode, moderate (HCC) 07/02/2015   Malaise and fatigue 07/02/2015   Memory difficulty    Mild CAD 08/31/2021   Mild protein-calorie malnutrition 07/18/2021   Nausea and vomiting 05/17/2021   Obesity (BMI 35.0-39.9 without comorbidity) 02/29/2016   Obesity, diabetes, and hypertension syndrome (HCC)    Organoaxial gastric volvulus 05/17/2021   Osteoporosis with current pathological fracture 12/15/2023   Other chest pain 07/19/2021   Pleural effusion on left 07/19/2021   Prediabetes 05/31/2016   Progressive angina (HCC) 08/11/2015   PTSD (post-traumatic stress disorder) 11/30/2020  Recurrent major depressive episodes, moderate (HCC) 08/31/2020   Rheumatoid arthritis (HCC) 04/12/2016   Severe episode of recurrent major depressive disorder, without psychotic features (HCC) 02/17/2021   Spastic esophagus    Suspected pulmonary embolism 03/11/2019   Thrombocytopenia 07/18/2021   Transaminitis 07/18/2021   Past Surgical History:  Procedure Laterality Date   ABDOMINAL HYSTERECTOMY     BACK SURGERY     BALLOON DILATION  01/27/2022   Procedure: BALLOON DILATION;  Surgeon: Sheldon Standing, MD;  Location: [facility];  Service: General;;   CARDIAC CATHETERIZATION N/A 08/12/2015   Procedure: Left Heart Cath and Coronary Angiography;  Surgeon: Gordy Bergamo, MD;  Location: [facility] INVASIVE CV LAB;  Service: Cardiovascular;  Laterality: N/A;   ESOPHAGEAL MANOMETRY N/A 07/01/2021   Procedure: ESOPHAGEAL MANOMETRY (EM);  Surgeon: Aneita Gwendlyn DASEN, MD;  Location: [facility];  Service: Endoscopy;  Laterality: N/A;   ESOPHAGOGASTRODUODENOSCOPY  N/A 01/27/2022   Procedure: ESOPHAGOGASTRODUODENOSCOPY (EGD);  Surgeon: Sheldon Standing, MD;  Location: [facility];  Service: General;  Laterality: N/A;   KENALOG  INJECTION  01/27/2022   Procedure: KENALOG  INJECTION;  Surgeon: Sheldon Standing, MD;  Location: [facility];  Service: General;;   LEFT HEART CATH AND CORONARY ANGIOGRAPHY N/A 02/20/2018   Procedure: LEFT HEART CATH AND CORONARY ANGIOGRAPHY;  Surgeon: Burnard Debby LABOR, MD;  Location: [facility] INVASIVE CV LAB;  Service: Cardiovascular;  Laterality: N/A;   LEFT HEART CATH AND CORONARY ANGIOGRAPHY N/A 01/19/2020   Procedure: LEFT HEART CATH AND CORONARY ANGIOGRAPHY;  Surgeon: Anner Alm ORN, MD;  Location: [facility] INVASIVE CV LAB;  Service: Cardiovascular;  Laterality: N/A;   TOTAL ABDOMINAL HYSTERECTOMY W/ BILATERAL SALPINGOOPHORECTOMY     XI ROBOTIC ASSISTED HIATAL HERNIA REPAIR N/A 07/15/2021   Procedure: robotic paraesophageal hiatal hernia with fundoplication, diaphragm release and mesh repair and bilateral TAP block;  Surgeon: Sheldon Standing, MD;  Location: [facility];  Service: General;  Laterality: N/A;    Family History  Problem Relation Age of Onset   Heart disease Mother    Diabetes Mother    Hypertension Mother        after birth   Heart disease Father    Heart attack Father 68   Hypertension Sister    Diabetes Sister    Diabetes Maternal Grandmother    Kidney disease Daughter        kidney removed after birth   Diabetes Maternal Aunt        all 7 maternal Aunts   Other Son        Adopted   Breast cancer Neg Hx    Social History   Socioeconomic History   Marital status: Married    Spouse name: Desteni Piscopo   Number of children: 2   Years of education: 12   Highest education level: Associate degree: occupational, scientist, product/process development, or vocational program  Occupational History   Occupation: retired  Tobacco Use   Smoking status: Never    Passive exposure: Never   Smokeless tobacco: Never  Vaping Use   Vaping status: Never Used   Substance and Sexual Activity   Alcohol use: No   Drug use: No   Sexual activity: Yes    Partners: Male  Other Topics Concern   Not on file  Social History Narrative   Not on file   Social Drivers of Health   Financial Resource Strain: Medium Risk (09/06/2023)   Overall Financial Resource Strain (CARDIA)    Difficulty of Paying Living Expenses: Somewhat hard  Food Insecurity: Food Insecurity Present (  09/06/2023)   Hunger Vital Sign    Worried About Running Out of Food in the Last Year: Sometimes true    Ran Out of Food in the Last Year: Never true  Transportation Needs: No Transportation Needs (09/06/2023)   PRAPARE - Administrator, Civil Service (Medical): No    Lack of Transportation (Non-Medical): No  Recent Concern: Transportation Needs - Unmet Transportation Needs (08/06/2023)   Received from [facility] - Transportation    Lack of Transportation (Medical): Yes    Lack of Transportation (Non-Medical): Yes  Physical Activity: Inactive (09/06/2023)   Exercise Vital Sign    Days of Exercise per Week: 0 days    Minutes of Exercise per Session: 0 min  Stress: Stress Concern Present (09/06/2023)   [employer] of Occupational Health - Occupational Stress Questionnaire    Feeling of Stress : Rather much  Social Connections: Moderately Isolated (09/06/2023)   Social Connection and Isolation Panel    Frequency of Communication with Friends and Family: More than three times a week    Frequency of Social Gatherings with Friends and Family: Once a week    Attends Religious Services: More than 4 times per year    Active Member of [employer] or Organizations: No    Attends Engineer, Structural: Never    Marital Status: Separated    Objective:  BP 132/82   Pulse 75   Temp 97.8 F (36.6 C)   Resp 16   Ht 5' 3 (1.6 m)   Wt 136 lb 12.8 oz (62.1 kg)   SpO2 96%   BMI 24.23 kg/m      03/10/2024    1:16 PM 12/26/2023    4:29 PM 12/14/2023   10:47 AM   BP/Weight  Systolic BP 132 132 130  Diastolic BP 82 80 70  Wt. (Lbs) 136.8 126 126  BMI 24.23 kg/m2 22.32 kg/m2 22.32 kg/m2    Physical Exam Vitals reviewed.  Constitutional:      Appearance: Normal appearance. She is normal weight.  Neck:     Vascular: No carotid bruit.  Cardiovascular:     Rate and Rhythm: Normal rate and regular rhythm.     Pulses: Normal pulses.     Heart sounds: Normal heart sounds.  Pulmonary:     Effort: Pulmonary effort is normal. No respiratory distress.     Breath sounds: Normal breath sounds.  Abdominal:     General: Abdomen is flat. Bowel sounds are normal.     Palpations: Abdomen is soft.     Tenderness: There is no abdominal tenderness.  Neurological:     General: No focal deficit present.     Mental Status: She is alert and oriented to person, place, and time.     Cranial Nerves: No cranial nerve deficit.     Motor: No weakness.  Psychiatric:        Mood and Affect: Mood normal.        Behavior: Behavior normal.      Diabetic foot exam was performed with the following findings:   No deformities, ulcerations, or other skin breakdown Normal sensation of 10g monofilament Intact posterior tibialis and dorsalis pedis pulses      Lab Results  Component Value Date   WBC 6.2 12/14/2023   HGB 13.1 12/14/2023   HCT 39.3 12/14/2023   PLT 110 (L) 12/14/2023   GLUCOSE 85 12/14/2023   CHOL 146 12/14/2023   TRIG 69 12/14/2023  HDL 65 12/14/2023   LDLCALC 67 12/14/2023   ALT 15 12/14/2023   AST 29 12/14/2023   NA 142 12/14/2023   K 3.5 12/14/2023   CL 106 12/14/2023   CREATININE 1.09 (H) 12/14/2023   BUN 13 12/14/2023   CO2 21 12/14/2023   TSH 4.030 12/14/2023   INR 1.1 12/29/2022   HGBA1C 5.5 09/07/2022    Results for orders placed or performed in visit on 12/14/23  CBC with Differential/Platelet   Collection Time: 12/14/23 11:55 AM  Result Value Ref Range   WBC 6.2 3.4 - 10.8 x10E3/uL   RBC 4.01 3.77 - 5.28 x10E6/uL    Hemoglobin 13.1 11.1 - 15.9 g/dL   Hematocrit 60.6 65.9 - 46.6 %   MCV 98 (H) 79 - 97 fL   MCH 32.7 26.6 - 33.0 pg   MCHC 33.3 31.5 - 35.7 g/dL   RDW 86.1 88.2 - 84.5 %   Platelets 110 (L) 150 - 450 x10E3/uL   Neutrophils 53 Not Estab. %   Lymphs 35 Not Estab. %   Monocytes 9 Not Estab. %   Eos 2 Not Estab. %   Basos 1 Not Estab. %   Neutrophils Absolute 3.3 1.4 - 7.0 x10E3/uL   Lymphocytes Absolute 2.2 0.7 - 3.1 x10E3/uL   Monocytes Absolute 0.6 0.1 - 0.9 x10E3/uL   EOS (ABSOLUTE) 0.2 0.0 - 0.4 x10E3/uL   Basophils Absolute 0.1 0.0 - 0.2 x10E3/uL   Immature Granulocytes 0 Not Estab. %   Immature Grans (Abs) 0.0 0.0 - 0.1 x10E3/uL  Comprehensive metabolic panel with GFR   Collection Time: 12/14/23 11:55 AM  Result Value Ref Range   Glucose 85 70 - 99 mg/dL   BUN 13 8 - 27 mg/dL   Creatinine, Ser 8.90 (H) 0.57 - 1.00 mg/dL   eGFR 52 (L) >40 fO/fpw/8.26   BUN/Creatinine Ratio 12 12 - 28   Sodium 142 134 - 144 mmol/L   Potassium 3.5 3.5 - 5.2 mmol/L   Chloride 106 96 - 106 mmol/L   CO2 21 20 - 29 mmol/L   Calcium 8.9 8.7 - 10.3 mg/dL   Total Protein 6.7 6.0 - 8.5 g/dL   Albumin  3.3 (L) 3.8 - 4.8 g/dL   Globulin, Total 3.4 1.5 - 4.5 g/dL   Bilirubin Total 1.0 0.0 - 1.2 mg/dL   Alkaline Phosphatase 169 (H) 44 - 121 IU/L   AST 29 0 - 40 IU/L   ALT 15 0 - 32 IU/L  Lipid panel   Collection Time: 12/14/23 11:55 AM  Result Value Ref Range   Cholesterol, Total 146 100 - 199 mg/dL   Triglycerides 69 0 - 149 mg/dL   HDL 65 >60 mg/dL   VLDL Cholesterol Cal 14 5 - 40 mg/dL   LDL Chol Calc (NIH) 67 0 - 99 mg/dL   Chol/HDL Ratio 2.2 0.0 - 4.4 ratio  TSH   Collection Time: 12/14/23 11:55 AM  Result Value Ref Range   TSH 4.030 0.450 - 4.500 uIU/mL  B12 and Folate Panel   Collection Time: 12/14/23 11:55 AM  Result Value Ref Range   Vitamin B-12 300 232 - 1,245 pg/mL   Folate 10.8 >3.0 ng/mL  VITAMIN D  25 Hydroxy (Vit-D Deficiency, Fractures)   Collection Time: 12/14/23 11:55 AM   Result Value Ref Range   Vit D, 25-Hydroxy 25.1 (L) 30.0 - 100.0 ng/mL  Methylmalonic acid, serum   Collection Time: 12/14/23 11:55 AM  Result Value Ref Range   Methylmalonic  Acid 727 (H) 0 - 378 nmol/L  .  Assessment & Plan:   Assessment & Plan Primary hypertension Blood pressure well-controlled on metoprolol . - Continue metoprolol  25 mg twice daily.    Acquired hypothyroidism The current medical regimen is effective;  continue present plan and medications. Continue synthroid  25 mcg once daily in am.     Confusion Episodes several times a week with symptoms of shakiness, jitteriness, hot flashes, and disorientation lasting up to six hours. Increased frequency noted. Differential includes seizure activity or stroke. Recent MRI normal, but episodes post-MRI require further evaluation. - Order CT scan of the brain today to evaluate for stroke or other abnormalities. - Instruct to go directly to the emergency department if another episode occurs. - Refer to neurology.  - DDX: TIA, hepatic encephalopathy, seizures.  Orders:   CBC with Differential/Platelet   Comprehensive metabolic panel with GFR   Protime-INR   CT HEAD WO CONTRAST ( ); Future  Rheumatoid arthritis of multiple sites with negative rheumatoid factor (HCC) Previously managed with methotrexate , discontinued due to liver issues. Mild symptoms currently. New rheumatologist appointment in December. - Follow up with rheumatologist in December.    Cirrhosis of liver with ascites, unspecified hepatic cirrhosis type (HCC) Cirrhosis likely due to long-term methotrexate  use for rheumatoid arthritis. Liver function improved post-methotrexate  discontinuation. Under care of liver specialist with follow-up scheduled. - Perform blood work including liver function tests, kidney function tests, INR, and ammonia level. - Continue current medications including spironolactone  for liver management. - Follow up with liver specialist  as scheduled. Orders:   CBC with Differential/Platelet   Comprehensive metabolic panel with GFR   Protime-INR  Transient alteration of awareness Episodes several times a week with symptoms of shakiness, jitteriness, hot flashes, and disorientation lasting up to six hours. Increased frequency noted. Differential includes seizure activity or stroke. Recent MRI normal, but episodes post-MRI require further evaluation. - Order CT scan of the brain today to evaluate for stroke or other abnormalities. - Instruct to go directly to the emergency department if another episode occurs. - Refer to neurology.  - DDX: TIA, hepatic encephalopathy, seizures.   Orders:   CT HEAD WO CONTRAST ( ); Future  Diabetes mellitus without complication (HCC) Control: excellent in the most recent past with weight loss.  Recommend check feet daily. Recommend annual eye exams. Medicines: none Continue to work on eating a healthy diet and exercise.  Labs drawn today.    Orders:   Hemoglobin A1c   Microalbumin / creatinine urine ratio  Encounter for immunization  Orders:   Flu vaccine HIGH DOSE PF(Fluzone Trivalent)  Mild recurrent major depression Long-standing depression managed with Wellbutrin  and Effexor . Some improvement noted, but mild symptoms persist. Current regimen effective, especially with potential seasonal affective changes. - Continue current regimen of Wellbutrin  and Effexor .      Body mass index is 24.23 kg/m.   No orders of the defined types were placed in this encounter.   Orders Placed This Encounter  Procedures   CT HEAD WO CONTRAST ( )   Flu vaccine HIGH DOSE PF(Fluzone Trivalent)   CBC with Differential/Platelet   Comprehensive metabolic panel with GFR   Protime-INR   Hemoglobin A1c   Microalbumin / creatinine urine ratio     Total time spent on today's visit was 40 minutes, including both face-to-face time and nonface-to-face time personally spent on review of  chart (labs and imaging), discussing labs and goals, discussing further work-up, treatment options, referrals to specialist if needed, reviewing outside  records of pertinent, answering patient's questions, and coordinating care.   Follow-up: Return in about 3 months (around 06/10/2024).  An After Visit Summary was printed and given to the patient.  Abigail Free, MD [facility] [phone] "

## 2024-03-10 ENCOUNTER — Ambulatory Visit: Admitting: Family Medicine

## 2024-03-10 ENCOUNTER — Ambulatory Visit: Payer: Self-pay | Admitting: Family Medicine

## 2024-03-10 ENCOUNTER — Encounter: Payer: Self-pay | Admitting: Family Medicine

## 2024-03-10 ENCOUNTER — Ambulatory Visit (INDEPENDENT_AMBULATORY_CARE_PROVIDER_SITE_OTHER)
Admission: RE | Admit: 2024-03-10 | Discharge: 2024-03-10 | Disposition: A | Discharge status: Home / Self Care | Source: Ambulatory Visit | Attending: Family Medicine | Admitting: Family Medicine

## 2024-03-10 VITALS — BP 132/82 | HR 75 | Temp 97.8°F | Resp 16 | Ht 63.0 in | Wt 136.8 lb

## 2024-03-10 DIAGNOSIS — E039 Hypothyroidism, unspecified: Secondary | ICD-10-CM

## 2024-03-10 DIAGNOSIS — I10 Essential (primary) hypertension: Secondary | ICD-10-CM

## 2024-03-10 DIAGNOSIS — E119 Type 2 diabetes mellitus without complications: Secondary | ICD-10-CM

## 2024-03-10 DIAGNOSIS — R410 Disorientation, unspecified: Secondary | ICD-10-CM | POA: Diagnosis not present

## 2024-03-10 DIAGNOSIS — M0609 Rheumatoid arthritis without rheumatoid factor, multiple sites: Secondary | ICD-10-CM

## 2024-03-10 DIAGNOSIS — R404 Transient alteration of awareness: Secondary | ICD-10-CM

## 2024-03-10 DIAGNOSIS — Z23 Encounter for immunization: Secondary | ICD-10-CM

## 2024-03-10 DIAGNOSIS — F330 Major depressive disorder, recurrent, mild: Secondary | ICD-10-CM | POA: Insufficient documentation

## 2024-03-10 DIAGNOSIS — R188 Other ascites: Secondary | ICD-10-CM

## 2024-03-10 DIAGNOSIS — K7460 Unspecified cirrhosis of liver: Secondary | ICD-10-CM

## 2024-03-10 NOTE — Assessment & Plan Note (Addendum)
The current medical regimen is effective;  continue present plan and medications. Continue synthroid 25 mcg once daily in am.

## 2024-03-10 NOTE — Assessment & Plan Note (Signed)
 Long-standing depression managed with Wellbutrin  and Effexor . Some improvement noted, but mild symptoms persist. Current regimen effective, especially with potential seasonal affective changes. - Continue current regimen of Wellbutrin  and Effexor .

## 2024-03-10 NOTE — Assessment & Plan Note (Addendum)
 Episodes several times a week with symptoms of shakiness, jitteriness, hot flashes, and disorientation lasting up to six hours. Increased frequency noted. Differential includes seizure activity or stroke. Recent MRI normal, but episodes post-MRI require further evaluation. - Order CT scan of the brain today to evaluate for stroke or other abnormalities. - Instruct to go directly to the emergency department if another episode occurs. - Refer to neurology.  - DDX: TIA, hepatic encephalopathy, seizures.  Orders:   CBC with Differential/Platelet   Comprehensive metabolic panel with GFR   Protime-INR   CT HEAD WO CONTRAST ( ); Future

## 2024-03-10 NOTE — Assessment & Plan Note (Addendum)
 Control: excellent in the most recent past with weight loss.  Recommend check feet daily. Recommend annual eye exams. Medicines: none Continue to work on eating a healthy diet and exercise.  Labs drawn today.    Orders:   Hemoglobin A1c   Microalbumin / creatinine urine ratio

## 2024-03-10 NOTE — Assessment & Plan Note (Addendum)
 Episodes several times a week with symptoms of shakiness, jitteriness, hot flashes, and disorientation lasting up to six hours. Increased frequency noted. Differential includes seizure activity or stroke. Recent MRI normal, but episodes post-MRI require further evaluation. - Order CT scan of the brain today to evaluate for stroke or other abnormalities. - Instruct to go directly to the emergency department if another episode occurs. - Refer to neurology.  - DDX: TIA, hepatic encephalopathy, seizures.   Orders:   CT HEAD WO CONTRAST ( ); Future

## 2024-03-10 NOTE — Assessment & Plan Note (Addendum)
 Blood pressure well-controlled on metoprolol . - Continue metoprolol  25 mg twice daily.

## 2024-03-10 NOTE — Patient Instructions (Signed)
  VISIT SUMMARY: Today, we discussed your recent episodes of confusion and disorientation, your ongoing liver disease management, rheumatoid arthritis, and depression. We also reviewed your current medications and made plans for further evaluation and follow-up.  YOUR PLAN: EPISODES OF CONFUSION AND DISORIENTATION: You have been experiencing episodes of shakiness, jitteriness, hot flashes, and disorientation several times a week, lasting up to six hours. -We will order a CT scan of your brain today to check for any signs of stroke or other abnormalities. -If you experience another episode, please go directly to the emergency department. -I anticipate a neurology referral.   UNSPECIFIED CIRRHOSIS OF LIVER: Your liver disease is likely due to long-term methotrexate  use for rheumatoid arthritis. Your liver function has improved since stopping methotrexate . -We will perform blood work including liver function tests, kidney function tests, INR, and ammonia level. -Continue taking spironolactone  as prescribed for liver management. -Follow up with your liver specialist as scheduled.  RHEUMATOID ARTHRITIS: Your rheumatoid arthritis is currently mild and under control after stopping methotrexate . -Follow up with your rheumatologist in December.  DEPRESSION: Your long-standing depression is being managed with Wellbutrin  and Effexor , with some improvement noted. -Continue taking Wellbutrin  and Effexor  as prescribed. -If you wish to reduce your Wellbutrin  dose, you can try it, but be ready to increase it again if your symptoms worsen.  ESSENTIAL HYPERTENSION: Your blood pressure is well-controlled with metoprolol . -Continue taking metoprolol  25 mg twice daily.                      Contains text generated by [employer].                                 Contains text generated by [employer].

## 2024-03-10 NOTE — Assessment & Plan Note (Addendum)
 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 Liver function improved post-methotrexate  discontinuation. Under care of liver specialist with follow-up scheduled. - Perform blood work including liver function tests, kidney function tests, INR, and ammonia level. - Continue current medications including spironolactone  for liver management. - Follow up with liver specialist as scheduled. Orders:   CBC with Differential/Platelet   Comprehensive metabolic panel with GFR   Protime-INR

## 2024-03-10 NOTE — Assessment & Plan Note (Addendum)
 Previously managed with methotrexate , discontinued due to liver issues. Mild symptoms currently. New rheumatologist appointment in December. - Follow up with rheumatologist in December.

## 2024-03-11 LAB — COMPREHENSIVE METABOLIC PANEL WITH GFR
ALT: 12 IU/L (ref 0–32)
AST: 29 IU/L (ref 0–40)
Albumin: 3.6 g/dL — ABNORMAL LOW (ref 3.8–4.8)
Alkaline Phosphatase: 210 IU/L — ABNORMAL HIGH (ref 49–135)
BUN/Creatinine Ratio: 9 — ABNORMAL LOW (ref 12–28)
BUN: 9 mg/dL (ref 8–27)
Bilirubin Total: 0.7 mg/dL (ref 0.0–1.2)
CO2: 25 mmol/L (ref 20–29)
Calcium: 9.8 mg/dL (ref 8.7–10.3)
Chloride: 105 mmol/L (ref 96–106)
Creatinine, Ser: 1.05 mg/dL — ABNORMAL HIGH (ref 0.57–1.00)
Globulin, Total: 3.5 g/dL (ref 1.5–4.5)
Glucose: 90 mg/dL (ref 70–99)
Potassium: 5.2 mmol/L (ref 3.5–5.2)
Sodium: 143 mmol/L (ref 134–144)
Total Protein: 7.1 g/dL (ref 6.0–8.5)
eGFR: 54 mL/min/1.73 — ABNORMAL LOW (ref >59)

## 2024-03-11 LAB — MICROALBUMIN / CREATININE URINE RATIO
Creatinine, Urine: 144.6 mg/dL
Microalb/Creat Ratio: 9 mg/g{creat} (ref 0–29)
Microalbumin, Urine: 13.6 ug/mL

## 2024-03-11 LAB — CBC WITH DIFFERENTIAL/PLATELET
Basophils Absolute: 0.1 x10E3/uL (ref 0.0–0.2)
Basos: 1 %
EOS (ABSOLUTE): 0.3 x10E3/uL (ref 0.0–0.4)
Eos: 4 %
Hematocrit: 43.4 % (ref 34.0–46.6)
Hemoglobin: 14.7 g/dL (ref 11.1–15.9)
Immature Grans (Abs): 0 x10E3/uL (ref 0.0–0.1)
Immature Granulocytes: 0 %
Lymphocytes Absolute: 2.7 x10E3/uL (ref 0.7–3.1)
Lymphs: 37 %
MCH: 32.7 pg (ref 26.6–33.0)
MCHC: 33.9 g/dL (ref 31.5–35.7)
MCV: 96 fL (ref 79–97)
Monocytes Absolute: 0.6 x10E3/uL (ref 0.1–0.9)
Monocytes: 9 %
Neutrophils Absolute: 3.5 x10E3/uL (ref 1.4–7.0)
Neutrophils: 49 %
Platelets: 156 x10E3/uL (ref 150–450)
RBC: 4.5 x10E6/uL (ref 3.77–5.28)
RDW: 13 % (ref 11.7–15.4)
WBC: 7.2 x10E3/uL (ref 3.4–10.8)

## 2024-03-11 LAB — HEMOGLOBIN A1C
Est. average glucose Bld gHb Est-mCnc: 114 mg/dL
Hgb A1c MFr Bld: 5.6 % (ref 4.8–5.6)

## 2024-03-11 LAB — PROTIME-INR

## 2024-03-16 ENCOUNTER — Other Ambulatory Visit: Payer: Self-pay | Admitting: Family Medicine

## 2024-03-16 DIAGNOSIS — R404 Transient alteration of awareness: Secondary | ICD-10-CM

## 2024-03-16 DIAGNOSIS — R410 Disorientation, unspecified: Secondary | ICD-10-CM

## 2024-03-16 NOTE — Progress Notes (Signed)
 ORDERED. DR. Reinhold Carbine

## 2024-03-21 ENCOUNTER — Other Ambulatory Visit: Payer: Self-pay

## 2024-03-21 MED ORDER — BLOOD GLUCOSE MONITORING SUPPL DEVI: 1.0000 | 0 refills | Status: AC

## 2024-03-21 MED ORDER — LANCETS MISC: 1.0000 | 2 refills | Status: AC

## 2024-03-21 MED ORDER — LANCET DEVICE MISC: 1.0000 | 0 refills | Status: DC

## 2024-03-21 MED ORDER — BLOOD GLUCOSE TEST VI STRP: 1.0000 | ORAL_STRIP | 2 refills | Status: DC

## 2024-03-21 MED ORDER — BLOOD GLUCOSE TEST VI STRP: 1.0000 | ORAL_STRIP | 2 refills | Status: AC

## 2024-03-21 MED ORDER — BLOOD GLUCOSE MONITORING SUPPL DEVI: 1.0000 | 0 refills | Status: DC

## 2024-03-21 NOTE — Telephone Encounter (Unsigned)
 Copied from CRM #[id number]. Topic: Clinical - Order For Equipment >> Mar 21, 2024  8:29 AM Montie POUR wrote: Reason for CRM:  Deborah Farley is requesting for a One Touch Verio or Libre 3 Glucose Monitor. Please send it in to [facility] that is listed in her chart. Please call Aaira at [phone] with any questions. Thanks

## 2024-04-19 ENCOUNTER — Other Ambulatory Visit: Payer: Self-pay | Admitting: Family Medicine

## 2024-04-19 DIAGNOSIS — F332 Major depressive disorder, recurrent severe without psychotic features: Secondary | ICD-10-CM

## 2024-04-21 ENCOUNTER — Other Ambulatory Visit: Payer: Self-pay

## 2024-04-21 DIAGNOSIS — F332 Major depressive disorder, recurrent severe without psychotic features: Secondary | ICD-10-CM

## 2024-04-22 MED ORDER — VENLAFAXINE HCL ER 75 MG PO CP24: 75.0000 mg | ORAL_CAPSULE | Freq: Every day | ORAL | 1 refills | Status: AC

## 2024-04-24 MED ORDER — LANCET DEVICE MISC: 1.0000 | 0 refills | Status: AC

## 2024-05-19 ENCOUNTER — Other Ambulatory Visit: Payer: Self-pay | Admitting: Family Medicine

## 2024-05-19 ENCOUNTER — Other Ambulatory Visit: Payer: Self-pay

## 2024-05-19 DIAGNOSIS — R188 Other ascites: Secondary | ICD-10-CM

## 2024-05-19 MED ORDER — SPIRONOLACTONE 25 MG PO TABS: 12.5000 mg | ORAL_TABLET | Freq: Every day | ORAL | 0 refills | Status: DC

## 2024-05-19 MED ORDER — VITAMIN D (ERGOCALCIFEROL) 1.25 MG (50000 UNIT) PO CAPS: 50000.0000 [IU] | ORAL_CAPSULE | ORAL | 0 refills | Status: DC

## 2024-05-19 MED ORDER — FUROSEMIDE 40 MG PO TABS: 40.0000 mg | ORAL_TABLET | Freq: Every day | ORAL | 0 refills | Status: DC

## 2024-06-13 ENCOUNTER — Telehealth: Payer: Self-pay

## 2024-06-13 NOTE — Telephone Encounter (Signed)
 Attempted to contact patient unable to leave voicemail. Need to r/s appointment for 2/9. She can see any other provider that day. Dr. Sherre will not be in the office.

## 2024-06-16 ENCOUNTER — Ambulatory Visit: Admitting: Family Medicine

## 2024-09-11 ENCOUNTER — Ambulatory Visit
